# Patient Record
Sex: Male | Born: 1975
Health system: Southern US, Community
[De-identification: ages and names within clinical notes are randomized; demographics above are authoritative.]

## PROBLEM LIST (undated history)

## (undated) ENCOUNTER — Emergency Department (HOSPITAL_COMMUNITY): Payer: PRIVATE HEALTH INSURANCE | Source: Home / Self Care

## (undated) DIAGNOSIS — G8929 Other chronic pain: Secondary | ICD-10-CM

## (undated) DIAGNOSIS — Z87442 Personal history of urinary calculi: Secondary | ICD-10-CM

## (undated) DIAGNOSIS — D869 Sarcoidosis, unspecified: Secondary | ICD-10-CM

## (undated) DIAGNOSIS — Z973 Presence of spectacles and contact lenses: Secondary | ICD-10-CM

## (undated) DIAGNOSIS — Z862 Personal history of diseases of the blood and blood-forming organs and certain disorders involving the immune mechanism: Secondary | ICD-10-CM

## (undated) DIAGNOSIS — I10 Essential (primary) hypertension: Secondary | ICD-10-CM

## (undated) DIAGNOSIS — H9190 Unspecified hearing loss, unspecified ear: Secondary | ICD-10-CM

## (undated) DIAGNOSIS — N200 Calculus of kidney: Secondary | ICD-10-CM

## (undated) DIAGNOSIS — IMO0001 Reserved for inherently not codable concepts without codable children: Secondary | ICD-10-CM

## (undated) HISTORY — DX: Personal history of diseases of the blood and blood-forming organs and certain disorders involving the immune mechanism: Z86.2

## (undated) HISTORY — DX: Reserved for inherently not codable concepts without codable children: IMO0001

## (undated) HISTORY — DX: Other chronic pain: G89.29

## (undated) HISTORY — DX: Essential (primary) hypertension: I10

## (undated) HISTORY — DX: Sarcoidosis, unspecified: D86.9

## (undated) HISTORY — DX: Unspecified hearing loss, unspecified ear: H91.90

## (undated) HISTORY — PX: ESOPHAGEAL DILATION: SHX303

## (undated) HISTORY — DX: Calculus of kidney: N20.0

## (undated) HISTORY — DX: Presence of spectacles and contact lenses: Z97.3

## (undated) SURGERY — Surgical Case
Anesthesia: *Unknown

---

## 2000-09-02 ENCOUNTER — Encounter: Payer: Self-pay | Admitting: Emergency Medicine

## 2000-09-02 ENCOUNTER — Emergency Department (HOSPITAL_COMMUNITY): Admission: EM | Admit: 2000-09-02 | Discharge: 2000-09-02 | Payer: Self-pay | Admitting: Emergency Medicine

## 2000-09-15 ENCOUNTER — Encounter: Admission: RE | Admit: 2000-09-15 | Discharge: 2000-09-15 | Payer: Self-pay | Admitting: Internal Medicine

## 2001-05-03 ENCOUNTER — Emergency Department (HOSPITAL_COMMUNITY): Admission: EM | Admit: 2001-05-03 | Discharge: 2001-05-04 | Payer: Self-pay | Admitting: Emergency Medicine

## 2003-06-25 ENCOUNTER — Emergency Department (HOSPITAL_COMMUNITY): Admission: EM | Admit: 2003-06-25 | Discharge: 2003-06-25 | Payer: Self-pay | Admitting: Emergency Medicine

## 2003-10-04 ENCOUNTER — Emergency Department (HOSPITAL_COMMUNITY): Admission: EM | Admit: 2003-10-04 | Discharge: 2003-10-05 | Payer: Self-pay | Admitting: Emergency Medicine

## 2004-06-09 ENCOUNTER — Ambulatory Visit: Payer: Self-pay | Admitting: Family Medicine

## 2004-06-23 ENCOUNTER — Ambulatory Visit: Payer: Self-pay | Admitting: Family Medicine

## 2004-10-07 ENCOUNTER — Emergency Department (HOSPITAL_COMMUNITY): Admission: EM | Admit: 2004-10-07 | Discharge: 2004-10-07 | Payer: Self-pay | Admitting: Emergency Medicine

## 2004-10-08 ENCOUNTER — Ambulatory Visit: Payer: Self-pay | Admitting: Family Medicine

## 2004-11-15 ENCOUNTER — Ambulatory Visit: Admission: RE | Admit: 2004-11-15 | Discharge: 2004-11-15 | Payer: Self-pay | Admitting: Family Medicine

## 2004-11-18 ENCOUNTER — Ambulatory Visit: Payer: Self-pay | Admitting: Pulmonary Disease

## 2006-06-01 ENCOUNTER — Ambulatory Visit: Payer: Self-pay | Admitting: Family Medicine

## 2006-09-13 ENCOUNTER — Ambulatory Visit: Payer: Self-pay | Admitting: Family Medicine

## 2006-11-14 ENCOUNTER — Emergency Department (HOSPITAL_COMMUNITY): Admission: EM | Admit: 2006-11-14 | Discharge: 2006-11-14 | Payer: Self-pay | Admitting: Emergency Medicine

## 2007-08-04 LAB — PULMONARY FUNCTION TEST

## 2007-09-28 ENCOUNTER — Ambulatory Visit (HOSPITAL_COMMUNITY): Admission: RE | Admit: 2007-09-28 | Discharge: 2007-09-28 | Payer: Self-pay | Admitting: Optometry

## 2007-09-29 ENCOUNTER — Ambulatory Visit (HOSPITAL_COMMUNITY): Admission: RE | Admit: 2007-09-29 | Discharge: 2007-09-29 | Payer: Self-pay | Admitting: Optometry

## 2008-12-08 ENCOUNTER — Emergency Department (HOSPITAL_BASED_OUTPATIENT_CLINIC_OR_DEPARTMENT_OTHER): Admission: EM | Admit: 2008-12-08 | Discharge: 2008-12-08 | Payer: Self-pay | Admitting: Emergency Medicine

## 2008-12-08 ENCOUNTER — Ambulatory Visit: Payer: Self-pay | Admitting: Diagnostic Radiology

## 2010-07-10 LAB — BASIC METABOLIC PANEL
BUN: 17 mg/dL (ref 6–23)
CO2: 26 mEq/L (ref 19–32)
Calcium: 10.2 mg/dL (ref 8.4–10.5)
Chloride: 104 mEq/L (ref 96–112)
Creatinine, Ser: 1 mg/dL (ref 0.4–1.5)
GFR calc Af Amer: >60 mL/min (ref >60)
GFR calc non Af Amer: >60 mL/min (ref >60)
Glucose, Bld: 76 mg/dL (ref 70–99)
Potassium: 4.1 mEq/L (ref 3.5–5.1)
Sodium: 141 mEq/L (ref 135–145)

## 2010-07-10 LAB — CBC
HCT: 43.2 % (ref 39.0–52.0)
Hemoglobin: 14.6 g/dL (ref 13.0–17.0)
MCHC: 33.8 g/dL (ref 30.0–36.0)
MCV: 87.5 fL (ref 78.0–100.0)
Platelets: 362 10*3/uL (ref 150–400)
RBC: 4.94 MIL/uL (ref 4.22–5.81)
RDW: 13.7 % (ref 11.5–15.5)
WBC: 3.9 10*3/uL — ABNORMAL LOW (ref 4.0–10.5)

## 2010-07-10 LAB — POCT CARDIAC MARKERS: Troponin i, poc: <0.05 ng/mL (ref 0.00–0.09)

## 2010-07-10 LAB — DIFFERENTIAL
Basophils Absolute: 0 10*3/uL (ref 0.0–0.1)
Eosinophils Absolute: 0 10*3/uL (ref 0.0–0.7)
Eosinophils Relative: 1 % (ref 0–5)
Lymphocytes Relative: 15 % (ref 12–46)
Monocytes Absolute: 0.4 10*3/uL (ref 0.1–1.0)

## 2010-08-21 NOTE — Procedures (Signed)
NAME:  [name], [name] NO.:  [id number]   MEDICAL RECORD NO.:  [id number]          PATIENT TYPE:  OUT   LOCATION:  SLEEP LAB                     FACILITY:  [facility]   PHYSICIAN:  Marcelyn Bruins, M.D. [employer] DATE OF BIRTH:  February 11, 1976   DATE OF STUDY:  11/15/2004                              NOCTURNAL POLYSOMNOGRAM   REFERRING PHYSICIAN:  Delaney Meigs, M.D.   DATE OF STUDY:  November 15, 2004.   INDICATIONS FOR STUDY:  Persistent disorder of initiating and maintaining  wakefulness.   EPWORTH SLEEPINESS SCORE:  10.   SLEEP ARCHITECTURE:  The patient had a total sleep time of 378 minutes with  decreased slow wave sleep as well as REM. Sleep onset latency was normal as  was REM onset. Sleep efficiency was very good at 94%.   IMPRESSION:  1.  One obstructive hypopnea noted during the entire study with no      clinically significant obstructive apneas. However, the patient did have      mild to moderate snoring as well as nonspecific arousals which could be      consistent with the upper airway resistant syndrome. He also had an      abnormal pressure tracing which could be suggestive of upper airway      resistance as well. Clinical correlation is suggested.  2.  Mild sleep talking noted during the study.  3.  No clinically significant cardiac arrhythmias.  4.  Small numbers of leg jerks without significant sleep disruption.  5.  There is nothing on this patient's sleep study that stands out as a      cause of his significant daytime sleepiness. Further evaluation is      needed for this particular complaint.                                            ______________________________  Marcelyn Bruins, M.D. [facility]  Diplomate, American Board of Sleep  Medicine     KC/MEDQ  D:  11/18/2004 11:48:22  T:  11/18/2004 12:38:03  Job:  [id number]

## 2010-10-16 ENCOUNTER — Encounter: Payer: Self-pay | Admitting: *Deleted

## 2010-10-16 ENCOUNTER — Encounter: Payer: Self-pay | Admitting: Internal Medicine

## 2010-10-19 ENCOUNTER — Ambulatory Visit (INDEPENDENT_AMBULATORY_CARE_PROVIDER_SITE_OTHER): Payer: PRIVATE HEALTH INSURANCE | Admitting: Internal Medicine

## 2010-10-19 ENCOUNTER — Ambulatory Visit (INDEPENDENT_AMBULATORY_CARE_PROVIDER_SITE_OTHER)
Admission: RE | Admit: 2010-10-19 | Discharge: 2010-10-19 | Disposition: A | Discharge status: Home / Self Care | Payer: PRIVATE HEALTH INSURANCE | Source: Ambulatory Visit | Attending: Internal Medicine | Admitting: Internal Medicine

## 2010-10-19 ENCOUNTER — Other Ambulatory Visit (INDEPENDENT_AMBULATORY_CARE_PROVIDER_SITE_OTHER): Payer: PRIVATE HEALTH INSURANCE

## 2010-10-19 ENCOUNTER — Encounter: Payer: Self-pay | Admitting: Internal Medicine

## 2010-10-19 ENCOUNTER — Encounter: Payer: Self-pay | Admitting: *Deleted

## 2010-10-19 VITALS — BP 118/86 | HR 60 | Ht 71.0 in | Wt 173.0 lb

## 2010-10-19 DIAGNOSIS — D869 Sarcoidosis, unspecified: Secondary | ICD-10-CM

## 2010-10-19 LAB — CBC WITH DIFFERENTIAL/PLATELET
Basophils Absolute: 0 10*3/uL (ref 0.0–0.1)
Eosinophils Relative: 1.2 % (ref 0.0–5.0)
HCT: 38.3 % — ABNORMAL LOW (ref 39.0–52.0)
Hemoglobin: 13.2 g/dL (ref 13.0–17.0)
Lymphs Abs: 0.7 10*3/uL (ref 0.7–4.0)
MCV: 87.1 fl (ref 78.0–100.0)
Monocytes Absolute: 0.3 10*3/uL (ref 0.1–1.0)
Monocytes Relative: 7.3 % (ref 3.0–12.0)
Neutro Abs: 3.6 10*3/uL (ref 1.4–7.7)
RDW: 15.6 % — ABNORMAL HIGH (ref 11.5–14.6)

## 2010-10-19 LAB — BASIC METABOLIC PANEL
CO2: 27 mEq/L (ref 19–32)
Calcium: 9.2 mg/dL (ref 8.4–10.5)
Creatinine, Ser: 1.3 mg/dL (ref 0.4–1.5)
GFR: 82.91 mL/min (ref >60.00)
Glucose, Bld: 110 mg/dL — ABNORMAL HIGH (ref 70–99)

## 2010-10-19 LAB — HEPATIC FUNCTION PANEL
Albumin: 3.9 g/dL (ref 3.5–5.2)
Total Protein: 7.7 g/dL (ref 6.0–8.3)

## 2010-10-19 NOTE — Patient Instructions (Signed)
Orders- PPD skin test               CXR               PFT               Lab- CBC w/ diff, BMET, Liver panel, ACE level                    All for dx Sarcoid

## 2010-10-19 NOTE — Progress Notes (Signed)
Subjective:    Patient ID: [name], male    DOB: [birth year], 35 y.o.   MRN: [id number]  HPI 10/19/10- 68 yoM former smoker, seen at request of Lonie Peak, [facility], in [place], because of sarcoid. Here with fiancee. Presented with chest pain, mediastinal adenopathy 2009. Bronchoscopy/ TBB x by Dr Orson Aloe + noncaseating granuomas. CT chest 2009- bilateral hilar adenopathy and diffuse peribronchovascular and parenchymal nodules c/w Stage II Sarcoid. PFT then restricted- FVC 0.56%; FEV1/FVC 0.90; DLCO 75%. Eye exam neg for sarcoid changes.  He was given an inhaler to try, with little effect, and scheduled back to discuss treatment. Admitting now he was scared, he accepted opportunity to go to [place] to work and was never treated.  Now back in this area. For last 1-2 years he has been more aware of exertional dyspnea, relieved by rest. Cough with thick white. Occasional sternal pain with cough. Occasional night sweat.and left flank pain.  Denies nodes, rash, hot joint pain. Was losing some weight until his PCP put him on prednisone. Now off x 3 weeks, it caused fluid retention, but relieved malaise. Tried Flovent and now Symbicort.  No hx TB exposure. 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  Review of Systems Constitutional:   No-   weight loss, night sweats, fevers, chills, fatigue, lassitude. HEENT:   No-   headaches, difficulty swallowing, tooth/dental problems, sore throat,                  No-   sneezing, itching, ear ache, nasal congestion, post nasal drip,   CV:  No-  , orthopnea, PND, swelling in lower extremities, anasarca, dizziness, palpitations  GI:  No-   heartburn, indigestion, abdominal pain, nausea, vomiting, diarrhea,                 change in bowel habits, loss of appetite  Resp:             No-     No non-productive cough,  No-  coughing up of blood.              No-   change in color of mucus.  No- wheezing.    Skin: No-   rash or  lesions.  GU: No-   dysuria, change in color of urine, no urgency or frequency.  No- flank pain.  MS:  No-   joint pain or swelling.  No- decreased range of motion.  No- back pain.  Psych:  No- change in mood or affect. No depression or anxiety.  No memory loss.      Objective:   Physical Exam General- Alert, Oriented, Affect-appropriate, Distress- none acute  Slender [place] Skin- rash-none, lesions- none, excoriation- none Lymphadenopathy- none Head- atraumatic            Eyes- Gross vision intact, PERRLA, conjunctivae clear secretions            Ears- Hearing, canals            Nose- Clear, No-Septal dev, mucus, polyps, erosion, perforation             Throat- Mallampati II , mucosa clear , drainage- none, tonsils- atrophic Neck- flexible , trachea midline, no stridor , thyroid nl, carotid no bruit Chest - symmetrical excursion , unlabored           Heart/CV- RRR , no murmur , no gallop  , no rub, nl s1 s2                           -  JVD- none , edema- none, stasis changes- none, varices- none           Lung- clear to P&A, wheeze- none, cough- none , dullness-none, rub- none  Breath sounds may be coarse, otw clear.           Chest wall-  Abd- tender-no, distended-no, bowel sounds-present, HSM- no Br/ Gen/ Rectal- Not done, not indicated Extrem- cyanosis- none, clubbing, none, atrophy- none, strength- nl Neuro- grossly intact to observation         Assessment & Plan:

## 2010-10-19 NOTE — Assessment & Plan Note (Addendum)
Cough with mucus, occ night sweats, DOE, weight loss- all may be from recurrence of sarcoid. Picture may be affected by prednisone course ended 3 weeks PTA. We will update CXR, PFT, ACE, renal and liver function, then get back to look at options. Can't exclude some OSA or reflux events at night, but not seen now as major risks.

## 2010-10-20 ENCOUNTER — Telehealth: Payer: Self-pay | Admitting: Internal Medicine

## 2010-10-20 LAB — ANGIOTENSIN CONVERTING ENZYME: Angiotensin-Converting Enzyme: 50 U/L (ref 8–52)

## 2010-10-20 NOTE — Progress Notes (Signed)
Quick Note:  Called, spoke with pt. He was informed of CXR results per CDY. He verbalized understanding of this. ______

## 2010-10-20 NOTE — Telephone Encounter (Signed)
Pt advised of labs and CXR per appends. Carron Curie, CMA

## 2010-10-21 ENCOUNTER — Telehealth: Payer: Self-pay | Admitting: Internal Medicine

## 2010-10-21 ENCOUNTER — Encounter: Payer: Self-pay | Admitting: Internal Medicine

## 2010-10-21 NOTE — Telephone Encounter (Signed)
Dr. Maple Hudson,  You wanted pt to have PPD skin test placed at [facility] on Monday but he was requesting to have this done at PCP office bc it is closer to his house.  Dr. Rutherford Nail office is needing an order faxed to them with the dx code for the PPD so pt will not have to pay for this out of pocket.  I have written the script for this -- could you pls sign it so we can fax it.  Thanks!

## 2010-10-21 NOTE — Telephone Encounter (Signed)
Signed by CDY and faxed back to number provided above - [id number] to inform pt of this.  Rx placed in CDY's scan folder.

## 2010-10-21 NOTE — Telephone Encounter (Signed)
Please call [phone] (laytoya stubbs) is returning triage call. [name]

## 2010-10-21 NOTE — Telephone Encounter (Signed)
Spoke with [place] and notified that order was faxed and she verbalized understanding.

## 2010-11-16 ENCOUNTER — Ambulatory Visit (INDEPENDENT_AMBULATORY_CARE_PROVIDER_SITE_OTHER): Payer: PRIVATE HEALTH INSURANCE | Admitting: Internal Medicine

## 2010-11-16 ENCOUNTER — Encounter: Payer: Self-pay | Admitting: Internal Medicine

## 2010-11-16 VITALS — BP 110/70 | HR 94 | Ht 71.0 in | Wt 166.4 lb

## 2010-11-16 DIAGNOSIS — D869 Sarcoidosis, unspecified: Secondary | ICD-10-CM

## 2010-11-16 MED ORDER — PREDNISONE 5 MG PO TABS: ORAL_TABLET | ORAL | Status: DC

## 2010-11-16 NOTE — Patient Instructions (Signed)
Stop Symbicort maintenance inhaler. We will watch to see if cough or chest tightness come when you are not using it.   Try prednisone 5 mg daily x 2 weeks then every other day for 2 weeks. See if it improves your appetite.

## 2010-11-16 NOTE — Assessment & Plan Note (Addendum)
Nothing we have at this time suggests active sarcoid except his weight loss and that may be due to something else. Early satiety without palpable HS megaly, may not be related to sarcoid. Lonie Peak may want to check a fasting glucose.  We discussed goals and side effects of sarcoid therapy. We decided to try low dose prednisone.

## 2010-11-16 NOTE — Progress Notes (Addendum)
Subjective:    Patient ID: [name], male    DOB: 1975/12/03, 35 y.o.   MRN: [id number]  HPI    Review of Systems     Objective:   Physical Exam        Assessment & Plan:   Subjective:    Patient ID: [name], male    DOB: [birth year], 35 y.o.   MRN: [id number]  HPI 10/19/10- 64 yoM former smoker, seen at request of Lonie Peak, [facility], in [place], because of sarcoid. Here with fiancee. Presented with chest pain, mediastinal adenopathy 2009. Bronchoscopy/ TBB x by Dr Orson Aloe + noncaseating granuomas. CT chest 2009- bilateral hilar adenopathy and diffuse peribronchovascular and parenchymal nodules c/w Stage II Sarcoid. PFT then restricted- FVC 0.56%; FEV1/FVC 0.90; DLCO 75%. Eye exam neg for sarcoid changes.  He was given an inhaler to try, with little effect, and scheduled back to discuss treatment. Admitting now he was scared, he accepted opportunity to go to [place] to work and was never treated.  Now back in this area. For last 1-2 years he has been more aware of exertional dyspnea, relieved by rest. Cough with thick white. Occasional sternal pain with cough. Occasional night sweat.and left flank pain.  Denies nodes, rash, hot joint pain. Was losing some weight until his PCP put him on prednisone. Now off x 3 weeks, it caused fluid retention, but relieved malaise. Tried Flovent and now Symbicort.  No hx TB exposure. 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  11/16/10- 34 yoM former smoker, seen at request of Lonie Peak, [facility], in [place], because of sarcoid. Fiance here Has remained off prednisone during lab evaluation. Without it appetite is poor and he is losing weight. Notes cough if he laughs or if he exerts and gets short of breath. May cough some yellow, no blood. Feels hot at times. No heavy night sweats. Denies rash or nodes. Early satiety. Denies nocturia, palpitation, nervousness.  Labs 10/29/10- PPD- negative by  PCP-documented ACE-50 (8-52) CBC okay, BMET okay (random glucose 110), LFT okay  CXR- 10/19/2010 stable bilateral upper zone scarring without change compared with September 2010   Review of Systems Constitutional:    + weight loss       No- night sweats, fevers, chills, fatigue, lassitude. HEENT:   No-  headaches, difficulty swallowing, tooth/dental problems, sore throat,       No-  sneezing, itching, ear ache, nasal congestion, post nasal drip,  CV:  No-   chest pain, orthopnea, PND, swelling in lower extremities, anasarca,dizziness, palpitations Resp: Minor-  shortness of breath with exertion or at rest.               + productive cough,  No non-productive cough,  No-  coughing up of blood.              No-   change in color of mucus.  No- wheezing.   Skin: No-   rash or lesions. GI:  No-   heartburn, indigestion, abdominal pain, nausea, vomiting, diarrhea,                 change in bowel habits, loss of appetite GU: No-   dysuria, change in color of urine, no urgency or frequency.  No- flank pain. MS:  No-   joint pain or swelling.  No- decreased range of motion.  No- back pain. Neuro- no change Psych:  No- change in mood or affect. No depression or anxiety.  No memory loss.   Objective:   Physical Exam General- Alert, Oriented, Affect-appropriate, Distress- none acute  Very slender AA male Skin- rash-none, lesions- none, excoriation- none Lymphadenopathy- none Head- atraumatic            Eyes- Gross vision intact, PERRLA, conjunctivae clear secretions            Ears- Hearing, canals normal            Nose- Clear, No-Septal dev, mucus, polyps, erosion, perforation             Throat- Mallampati II , mucosa clear , drainage- none, tonsils- atrophic Neck- flexible , trachea midline, no stridor , thyroid nl, carotid no bruit Chest - symmetrical excursion , unlabored           Heart/CV- RRR , no murmur , no gallop  , no rub, nl s1 s2                           - JVD- none , edema-  none, stasis changes- none, varices- none           Lung- clear to P&A, wheeze- none, cough- none , dullness-none, rub- none  Breath sounds may be coarse, otw clear.           Chest wall-  Abd- tender-no, distended-no, bowel sounds-present, HSM- no Br/ Gen/ Rectal- Not done, not indicated Extrem- cyanosis- none, clubbing, none, atrophy- none, strength- nl Neuro- grossly intact to observation         Assessment & Plan:

## 2010-12-25 ENCOUNTER — Encounter: Payer: Self-pay | Admitting: Internal Medicine

## 2010-12-25 ENCOUNTER — Ambulatory Visit (INDEPENDENT_AMBULATORY_CARE_PROVIDER_SITE_OTHER): Payer: PRIVATE HEALTH INSURANCE | Admitting: Internal Medicine

## 2010-12-25 VITALS — BP 122/90 | HR 81 | Ht 71.0 in | Wt 168.2 lb

## 2010-12-25 DIAGNOSIS — D869 Sarcoidosis, unspecified: Secondary | ICD-10-CM

## 2010-12-25 DIAGNOSIS — Z23 Encounter for immunization: Secondary | ICD-10-CM

## 2010-12-25 MED ORDER — PREDNISONE 5 MG PO TABS: ORAL_TABLET | ORAL | Status: AC

## 2010-12-25 NOTE — Patient Instructions (Addendum)
Change your prednisone to take 2 x 5 mg =  10 mg every other day. Script printed.  Please call as needed  Flu vax

## 2010-12-25 NOTE — Assessment & Plan Note (Signed)
Symptomatically improved, but with some ambiguous aches and pains. Lengthy educational discussion- 20 minutes- of steroid therapy, risks and side effects, nature of prednisone. He probably has indolent sarcoid, not quite full remission. Plan- try increase to 10 mg pred qod.

## 2010-12-25 NOTE — Progress Notes (Signed)
Subjective:   Shortness of Breath   Review of Systems  Respiratory: Positive for shortness of breath.       Objective:   Physical Exam    sessment & Plan:   Subjective:    Patient ID: [name], male    DOB: Sep 22, 1975, 35 y.o.   MRN: [id number]  HPI 10/19/10- 65 yoM former smoker, seen at request of Lonie Peak, [facility], in [place], because of sarcoid. Here with fiancee. Presented with chest pain, mediastinal adenopathy 2009. Bronchoscopy/ TBB x by Dr Orson Aloe + noncaseating granuomas. CT chest 2009- bilateral hilar adenopathy and diffuse peribronchovascular and parenchymal nodules c/w Stage II Sarcoid. PFT then restricted- FVC 0.56%; FEV1/FVC 0.90; DLCO 75%. Eye exam neg for sarcoid changes.  He was given an inhaler to try, with little effect, and scheduled back to discuss treatment. Admitting now he was scared, he accepted opportunity to go to [place] to work and was never treated.  Now back in this area. For last 1-2 years he has been more aware of exertional dyspnea, relieved by rest. Cough with thick white. Occasional sternal pain with cough. Occasional night sweat.and left flank pain.  Denies nodes, rash, hot joint pain. Was losing some weight until his PCP put him on prednisone. Now off x 3 weeks, it caused fluid retention, but relieved malaise. Tried Flovent and now Symbicort.  No hx TB exposure. 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  11/16/10- 34 yoM former smoker, seen at request of Lonie Peak, [facility], in [place], because of sarcoid. Fiance here Has remained off prednisone during lab evaluation. Without it appetite is poor and he is losing weight. Notes cough if he laughs or if he exerts and gets short of breath. May cough some yellow, no blood. Feels hot at times. No heavy night sweats. Denies rash or nodes. Early satiety. Denies nocturia, palpitation, nervousness.  Labs 10/29/10- PPD- negative by PCP-documented ACE-50 (8-52) CBC okay,  BMET okay (random glucose 110), LFT okay  CXR- 10/19/2010 stable bilateral upper zone scarring without change compared with September 2010  12/25/10- 34 yoM former smoker, seen at request of Lonie Peak, [facility], in [place], because of sarcoid. Fiancee here. At last visit we had him take prednisone 5 mg daily , then every other day, and stop Symbicort.  Since then he has felt some better, with no new problems. Weight is up 4-5 lbs and appetite is better. Less short of breath, still some dry cough but no more tussive chest soreness. No longer having heavy night sweats. Notes some soreness in right flank which may be muscular, w/o dysuria. Some joint ache- mainly left elbow, and a little muscle twitch. No longer having leg edema seen at initial higher dose steroid introduction.  Review of Systems Constitutional:    No- weight loss       No- night sweats, fevers, chills, fatigue, lassitude. HEENT:   No-  headaches, difficulty swallowing, tooth/dental problems, sore throat,       No-  sneezing, itching, ear ache, nasal congestion, post nasal drip,  CV:  No-   chest pain, orthopnea, PND, swelling in lower extremities, anasarca,dizziness, palpitations Resp: Minor-  shortness of breath with exertion or at rest.               No- productive cough,  + non-productive cough,  No-  coughing up of blood.              No-   change in color  of mucus.  No- wheezing.   Skin: No-   rash or lesions. GI:  No-   heartburn, indigestion, abdominal pain, nausea, vomiting, diarrhea,                 change in bowel habits, loss of appetite GU: No-   dysuria, change in color of urine, no urgency or frequency.  No- flank pain. MS:  No-   joint pain or swelling.  No- decreased range of motion.  + back pain-right flank muscle Neuro- no change Psych:  No- change in mood or affect. No depression or anxiety.  No memory loss.   Objective:   Physical Exam General- Alert, Oriented, Affect-appropriate, Distress- none acute  Very  slender AA male Skin- rash-none, lesions- none, excoriation- none Lymphadenopathy- none Head- atraumatic            Eyes- Gross vision intact, PERRLA, conjunctivae clear secretions            Ears- Hearing, canals normal            Nose- Clear, No-Septal dev, mucus, polyps, erosion, perforation             Throat- Mallampati II , mucosa clear , drainage- none, tonsils- atrophic Neck- flexible , trachea midline, no stridor , thyroid nl, carotid no bruit Chest - symmetrical excursion , unlabored           Heart/CV- RRR , no murmur , no gallop  , no rub, nl s1 s2                           - JVD- none , edema- none, stasis changes- none, varices- none           Lung- clear to P&A, wheeze- none, cough- none , dullness-none, rub- none  Breath sounds may be coarse, otw clear.           Chest wall-  Abd- tender-no, distended-no, bowel sounds-present, HSM- no Br/ Gen/ Rectal- Not done, not indicated Extrem- cyanosis- none, clubbing, none, atrophy- none, strength- nl Neuro- grossly intact to observation         Assessment & Plan:

## 2011-04-26 ENCOUNTER — Other Ambulatory Visit: Payer: PRIVATE HEALTH INSURANCE

## 2011-04-26 ENCOUNTER — Ambulatory Visit (INDEPENDENT_AMBULATORY_CARE_PROVIDER_SITE_OTHER)
Admission: RE | Admit: 2011-04-26 | Discharge: 2011-04-26 | Disposition: A | Discharge status: Home / Self Care | Payer: BC Managed Care – PPO | Source: Ambulatory Visit | Attending: Internal Medicine | Admitting: Internal Medicine

## 2011-04-26 ENCOUNTER — Encounter: Payer: Self-pay | Admitting: Internal Medicine

## 2011-04-26 ENCOUNTER — Ambulatory Visit (INDEPENDENT_AMBULATORY_CARE_PROVIDER_SITE_OTHER): Payer: BC Managed Care – PPO | Admitting: Internal Medicine

## 2011-04-26 VITALS — BP 118/64 | HR 91 | Ht 71.0 in | Wt 175.2 lb

## 2011-04-26 DIAGNOSIS — D869 Sarcoidosis, unspecified: Secondary | ICD-10-CM

## 2011-04-26 NOTE — Patient Instructions (Signed)
Order - CXR- dx sarcoid              Lab- ACE level  Try a skin moisturizer- Eucerin is fine

## 2011-04-26 NOTE — Progress Notes (Signed)
Patient ID: [name], male    DOB: 08-Apr-1975, 36 y.o.   MRN: [id number]  HPI 10/19/10- 47 yoM former smoker, seen at request of Lonie Peak, [facility], in [place], because of sarcoid. Here with fiancee. Presented with chest pain, mediastinal adenopathy 2009. Bronchoscopy/ TBB x by Dr Orson Aloe + noncaseating granuomas. CT chest 2009- bilateral hilar adenopathy and diffuse peribronchovascular and parenchymal nodules c/w Stage II Sarcoid. PFT then restricted- FVC 0.56%; FEV1/FVC 0.90; DLCO 75%. Eye exam neg for sarcoid changes.  He was given an inhaler to try, with little effect, and scheduled back to discuss treatment. Admitting now he was scared, he accepted opportunity to go to [place] to work and was never treated.  Now back in this area. For last 1-2 years he has been more aware of exertional dyspnea, relieved by rest. Cough with thick white. Occasional sternal pain with cough. Occasional night sweat.and left flank pain.  Denies nodes, rash, hot joint pain. Was losing some weight until his PCP put him on prednisone. Now off x 3 weeks, it caused fluid retention, but relieved malaise. Tried Flovent and now Symbicort.  No hx TB exposure. 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  11/16/10- 34 yoM former smoker, seen at request of Lonie Peak, [facility], in [place], because of sarcoid. Fiance here Has remained off prednisone during lab evaluation. Without it appetite is poor and he is losing weight. Notes cough if he laughs or if he exerts and gets short of breath. May cough some yellow, no blood. Feels hot at times. No heavy night sweats. Denies rash or nodes. Early satiety. Denies nocturia, palpitation, nervousness.  Labs 10/29/10- PPD- negative by PCP-documented ACE-50 (8-52) CBC okay, BMET okay (random glucose 110), LFT okay  CXR- 10/19/2010 stable bilateral upper zone scarring without change compared with September 2010  12/25/10- 34 yoM former smoker, seen at  request of Lonie Peak, [facility], in [place], because of sarcoid. Fiancee here. At last visit we had him take prednisone 5 mg daily , then every other day, and stop Symbicort.  Since then he has felt some better, with no new problems. Weight is up 4-5 lbs and appetite is better. Less short of breath, still some dry cough but no more tussive chest soreness. No longer having heavy night sweats. Notes some soreness in right flank which may be muscular, w/o dysuria. Some joint ache- mainly left elbow, and a little muscle twitch. No longer having leg edema seen at initial higher dose steroid introduction.  04/26/11- 34 yoM former smoker because of sarcoid. Fiancee here. No respiratory infection/ colds or flu this winter. Was on prednisone 10 mg qod after last visit, but dropped off at Christmas when he left pills while visiting in [place]. Has been off prednisone x 1 month. He and fiance note increased night sweats and nonproductive cough since off prednisone. Generalized itching w/o rash- skin is very dry. Denies fever, nodes, abdominal pain, dyspnea.  ACE level 10/19/10- 50 CXR 10/19/10- diffuse reticulonodular scarring, chronic.  Review of Systems Constitutional:    No- weight loss       No- night sweats, fevers, chills, fatigue, lassitude. HEENT:   No-  headaches, difficulty swallowing, tooth/dental problems, sore throat,       No-  sneezing, itching, ear ache, nasal congestion, post nasal drip,  CV:  No-   chest pain, orthopnea, PND, swelling in lower extremities, anasarca,dizziness, palpitations Resp: Minor-  shortness of breath with exertion or at rest.  No- productive cough,  + non-productive cough,  No-  coughing up of blood.              No-   change in color of mucus.  No- wheezing.   Skin: No-  rash or lesions. + dry, itching GI:  No-   heartburn, indigestion, abdominal pain, nausea, vomiting, diarrhea,                 change in bowel habits, loss of appetite GU:  MS:  No-   joint  pain or swelling.  No- decreased range of motion.  + back pain-right flank muscle Neuro- no change Psych:  No- change in mood or affect. No depression or anxiety.  No memory loss.   Objective:   Physical Exam General- Alert, Oriented, Affect-appropriate, Distress- none acute  Very slender AA male Skin- rash-none, lesions- none, excoriation- none Lymphadenopathy- none Head- atraumatic            Eyes- Gross vision intact, PERRLA, conjunctivae clear secretions            Ears- Hearing, canals normal            Nose- Clear, No-Septal dev, mucus, polyps, erosion, perforation             Throat- Mallampati II , mucosa clear , drainage- none, tonsils- atrophic Neck- flexible , trachea midline, no stridor , thyroid nl, carotid no bruit Chest - symmetrical excursion , unlabored           Heart/CV- RRR , no murmur , no gallop  , no rub, nl s1 s2                           - JVD- none , edema- none, stasis changes- none, varices- none           Lung- trace crackle, wheeze- none, cough-+ once, dullness-none, rub- none             Chest wall-  Abd- tender-no, distended-no, bowel sounds-present, HSM- no Br/ Gen/ Rectal- Not done, not indicated Extrem- cyanosis- none, clubbing, none, atrophy- none, strength- nl Neuro- grossly intact to observation

## 2011-04-26 NOTE — Assessment & Plan Note (Signed)
Increased cough and night sweats since off prednisone x 1 month, c/w w/ increased sarcoid activity. The itching may just be from dry skin. Plan- CXR, ACE, Eucerin   We will decide about restarting prednisone after labs reviewed- we will call.

## 2011-04-29 ENCOUNTER — Other Ambulatory Visit: Payer: Self-pay | Admitting: Internal Medicine

## 2011-04-29 MED ORDER — PREDNISONE 10 MG PO TABS: 10.0000 mg | ORAL_TABLET | ORAL | Status: DC

## 2011-04-29 NOTE — Progress Notes (Signed)
Quick Note:  LMTCB ______ 

## 2011-04-29 NOTE — Progress Notes (Signed)
Quick Note:  Pt aware of results and Rx sent to [facility] as pts request. ______

## 2011-04-29 NOTE — Progress Notes (Signed)
Quick Note:  Pt aware of results. ______ 

## 2011-07-26 ENCOUNTER — Ambulatory Visit: Payer: PRIVATE HEALTH INSURANCE | Admitting: Internal Medicine

## 2011-10-25 ENCOUNTER — Encounter (HOSPITAL_COMMUNITY): Payer: Self-pay

## 2011-10-25 ENCOUNTER — Emergency Department (HOSPITAL_COMMUNITY)
Admission: EM | Admit: 2011-10-25 | Discharge: 2011-10-25 | Disposition: A | Discharge status: Home / Self Care | Payer: Self-pay | Source: Home / Self Care | Attending: Emergency Medicine | Admitting: Emergency Medicine

## 2011-10-25 DIAGNOSIS — K0510 Chronic gingivitis, plaque induced: Secondary | ICD-10-CM

## 2011-10-25 DIAGNOSIS — K056 Periodontal disease, unspecified: Secondary | ICD-10-CM

## 2011-10-25 MED ORDER — PENICILLIN V POTASSIUM 500 MG PO TABS: 500.0000 mg | ORAL_TABLET | Freq: Four times a day (QID) | ORAL | Status: AC

## 2011-10-25 MED ORDER — IBUPROFEN 800 MG PO TABS: 800.0000 mg | ORAL_TABLET | Freq: Three times a day (TID) | ORAL | Status: AC

## 2011-10-25 MED ORDER — HYDROCODONE-ACETAMINOPHEN 5-500 MG PO TABS: 1.0000 | ORAL_TABLET | Freq: Four times a day (QID) | ORAL | Status: AC | PRN

## 2011-10-25 NOTE — ED Notes (Signed)
C/o dental pain- (rt upper and lower) since Thursday. Doesn't have dentist.

## 2011-10-25 NOTE — ED Provider Notes (Addendum)
History     CSN: [id number]  Arrival date & time 10/25/11  1733   First MD Initiated Contact with Patient 10/25/11 1742      Chief Complaint  Patient presents with  . Dental Pain    (Consider location/radiation/quality/duration/timing/severity/associated sxs/prior treatment) Patient is a 36 y.o. male presenting with tooth pain. The history is provided by the patient.  Dental PainThe primary symptoms include mouth pain and sore throat. Primary symptoms do not include oral bleeding, oral lesions, headaches, fever, shortness of breath or angioedema. The symptoms began 3 to 5 days ago. The symptoms are unchanged. The symptoms occur constantly.  The sore throat is not accompanied by trouble swallowing.  Additional symptoms do not include: trouble swallowing, nosebleeds and fatigue.    Past Medical History  Diagnosis Date  . Sarcoidosis   . Hypertension   . Asthma     Past Surgical History  Procedure Date  . None     Family History  Problem Relation Age of Onset  . Hypertension Mother   . Asthma Mother   . Diabetes Father   . Heart attack Father   . Cancer Paternal Grandmother     History  Substance Use Topics  . Smoking status: Former Smoker -- 0.2 packs/day for 2 years    Types: Cigarettes    Quit date: 04/06/2007  . Smokeless tobacco: Never Used  . Alcohol Use: No      Review of Systems  Constitutional: Negative for fever, activity change and fatigue.  HENT: Positive for sore throat. Negative for nosebleeds, congestion, trouble swallowing, neck pain and voice change.   Respiratory: Negative for shortness of breath.   Skin: Negative for rash.  Neurological: Negative for headaches.    Allergies  Review of patient's allergies indicates no known allergies.  Home Medications   Current Outpatient Rx  Name Route Sig Dispense Refill  . ALBUTEROL SULFATE HFA 108 (90 BASE) MCG/ACT IN AERS Inhalation Inhale 2 puffs into the lungs every 6 (six) hours as needed.       Marland Kitchen FLUTICASONE PROPIONATE  HFA 110 MCG/ACT IN AERO Inhalation Inhale 2 puffs into the lungs 2 (two) times daily. ON HOLD    . HYDROCODONE-ACETAMINOPHEN 5-500 MG PO TABS Oral Take 1 tablet by mouth every 6 (six) hours as needed for pain. 15 tablet 0  . IBUPROFEN 800 MG PO TABS Oral Take 1 tablet (800 mg total) by mouth 3 (three) times daily. 15 tablet 0  . LOSARTAN POTASSIUM 50 MG PO TABS Oral Take 50 mg by mouth daily.      Marland Kitchen PENICILLIN V POTASSIUM 500 MG PO TABS Oral Take 1 tablet (500 mg total) by mouth 4 (four) times daily. 20 tablet 0  . PREDNISONE 10 MG PO TABS Oral Take 1 tablet (10 mg total) by mouth every other day. 50 tablet 0    BP 141/98  Pulse 67  Temp 98.1 F (36.7 C) (Oral)  Resp 18  SpO2 100%  Physical Exam  Nursing note and vitals reviewed. Constitutional: He appears well-developed and well-nourished.  HENT:  Mouth/Throat: Oropharynx is clear and moist and mucous membranes are normal. Abnormal dentition. Dental caries present.    Neck: Normal range of motion.  Neurological: He is alert.  Skin: No rash noted. No erythema.    ED Course  Procedures (including critical care time)  Labs Reviewed - No data to display No results found.   1. Periodontal disease   2. Gingivitis  MDM   Poor dentition, periodontal disease and 2 molars without is occlusal surface. Patient is afebrile, with no other constitutional symptoms. Will see his dentist, Monday next week.       Jimmie Molly, MD 10/25/11 1946  Jimmie Molly, MD 10/25/11 [phone]

## 2012-01-04 ENCOUNTER — Emergency Department (HOSPITAL_COMMUNITY): Payer: Self-pay

## 2012-01-04 ENCOUNTER — Emergency Department (HOSPITAL_COMMUNITY)
Admission: EM | Admit: 2012-01-04 | Discharge: 2012-01-04 | Disposition: A | Discharge status: Home / Self Care | Payer: Self-pay | Attending: Emergency Medicine | Admitting: Emergency Medicine

## 2012-01-04 DIAGNOSIS — J45909 Unspecified asthma, uncomplicated: Secondary | ICD-10-CM | POA: Insufficient documentation

## 2012-01-04 DIAGNOSIS — D869 Sarcoidosis, unspecified: Secondary | ICD-10-CM | POA: Insufficient documentation

## 2012-01-04 DIAGNOSIS — N200 Calculus of kidney: Secondary | ICD-10-CM | POA: Insufficient documentation

## 2012-01-04 DIAGNOSIS — Z87891 Personal history of nicotine dependence: Secondary | ICD-10-CM | POA: Insufficient documentation

## 2012-01-04 DIAGNOSIS — N201 Calculus of ureter: Secondary | ICD-10-CM | POA: Insufficient documentation

## 2012-01-04 DIAGNOSIS — I10 Essential (primary) hypertension: Secondary | ICD-10-CM | POA: Insufficient documentation

## 2012-01-04 DIAGNOSIS — R110 Nausea: Secondary | ICD-10-CM | POA: Insufficient documentation

## 2012-01-04 DIAGNOSIS — N135 Crossing vessel and stricture of ureter without hydronephrosis: Secondary | ICD-10-CM

## 2012-01-04 DIAGNOSIS — R109 Unspecified abdominal pain: Secondary | ICD-10-CM | POA: Insufficient documentation

## 2012-01-04 LAB — CBC WITH DIFFERENTIAL/PLATELET
Basophils Absolute: 0 10*3/uL (ref 0.0–0.1)
HCT: 39.6 % (ref 39.0–52.0)
Hemoglobin: 14 g/dL (ref 13.0–17.0)
Lymphocytes Relative: 12 % (ref 12–46)
Monocytes Absolute: 0.4 10*3/uL (ref 0.1–1.0)
Neutro Abs: 5.5 10*3/uL (ref 1.7–7.7)
RBC: 4.84 MIL/uL (ref 4.22–5.81)
RDW: 13.6 % (ref 11.5–15.5)
WBC: 6.8 10*3/uL (ref 4.0–10.5)

## 2012-01-04 LAB — COMPREHENSIVE METABOLIC PANEL
ALT: 13 U/L (ref 0–53)
AST: 24 U/L (ref 0–37)
CO2: 25 mEq/L (ref 19–32)
Chloride: 98 mEq/L (ref 96–112)
Creatinine, Ser: 1.77 mg/dL — ABNORMAL HIGH (ref 0.50–1.35)
GFR calc non Af Amer: 47 mL/min — ABNORMAL LOW (ref >90)
Total Bilirubin: 0.6 mg/dL (ref 0.3–1.2)

## 2012-01-04 LAB — URINALYSIS, ROUTINE W REFLEX MICROSCOPIC
Glucose, UA: NEGATIVE mg/dL
Protein, ur: NEGATIVE mg/dL
Specific Gravity, Urine: 1.02 (ref 1.005–1.030)
pH: 6 (ref 5.0–8.0)

## 2012-01-04 LAB — URINE MICROSCOPIC-ADD ON

## 2012-01-04 MED ORDER — MORPHINE SULFATE 4 MG/ML IJ SOLN
6.0000 mg | Freq: Once | INTRAMUSCULAR | Status: AC
  Administered 2012-01-04: 6 mg via INTRAVENOUS
  Filled 2012-01-04: qty 2

## 2012-01-04 MED ORDER — OXYCODONE-ACETAMINOPHEN 5-325 MG PO TABS: 1.0000 | ORAL_TABLET | ORAL | Status: DC | PRN

## 2012-01-04 MED ORDER — ONDANSETRON HCL 4 MG/2ML IJ SOLN
4.0000 mg | Freq: Once | INTRAMUSCULAR | Status: AC
  Administered 2012-01-04: 4 mg via INTRAVENOUS
  Filled 2012-01-04: qty 2

## 2012-01-04 MED ORDER — HYDROMORPHONE HCL PF 1 MG/ML IJ SOLN
1.0000 mg | Freq: Once | INTRAMUSCULAR | Status: AC
  Administered 2012-01-04: 1 mg via INTRAVENOUS
  Filled 2012-01-04: qty 1

## 2012-01-04 NOTE — ED Notes (Signed)
Right sided flank pain started bad today, does not have regular doctor. States when pain hits, makes nauseated. Pain comes and goes-- intermittent. Pain was relieved last night with ibuprofen, went to work today, pain radiated to abd area-- with nausea

## 2012-01-04 NOTE — ED Provider Notes (Signed)
History     CSN: [id number]  Arrival date & time 01/04/12  1754   First MD Initiated Contact with Patient 01/04/12 2005      Chief Complaint  Patient presents with  . Flank Pain    (Consider location/radiation/quality/duration/timing/severity/associated sxs/prior treatment) HPI Patient presents to the emergency department with a two-day history of right flank pain.  Patient, states the pain started in the right back and seemed to then radiate around to his right abdomen.  Patient, states, that he's had nausea, but no vomiting, or diarrhea.  Patient denies fevers, chest pain, shortness of breath, fever, headache, or weakness.  Patient denies any blood in his urine.  Patient, states he took ibuprofen without relief of the symptoms.  Past Medical History  Diagnosis Date  . Sarcoidosis   . Hypertension   . Asthma     Past Surgical History  Procedure Date  . None     Family History  Problem Relation Age of Onset  . Hypertension Mother   . Asthma Mother   . Diabetes Father   . Heart attack Father   . Cancer Paternal Grandmother     History  Substance Use Topics  . Smoking status: Former Smoker -- 0.2 packs/day for 2 years    Types: Cigarettes    Quit date: 04/06/2007  . Smokeless tobacco: Never Used  . Alcohol Use: No      Review of Systems All other systems negative except as documented in the HPI. All pertinent positives and negatives as reviewed in the HPI.  Allergies  Review of patient's allergies indicates no known allergies.  Home Medications   Current Outpatient Rx  Name Route Sig Dispense Refill  . ALBUTEROL SULFATE HFA 108 (90 BASE) MCG/ACT IN AERS Inhalation Inhale 2 puffs into the lungs every 6 (six) hours as needed. For wheezing or shortness of breath    . IBUPROFEN 200 MG PO TABS Oral Take 800 mg by mouth every 6 (six) hours as needed. For pain      BP 133/87  Pulse 84  Temp 98.2 F (36.8 C)  Resp 16  SpO2 99%  Physical Exam    Constitutional: He is oriented to person, place, and time. He appears well-developed and well-nourished. No distress.  HENT:  Head: Normocephalic and atraumatic.  Mouth/Throat: Oropharynx is clear and moist.  Neck: Normal range of motion. Neck supple.  Cardiovascular: Normal rate, regular rhythm and normal heart sounds.  Exam reveals no gallop and no friction rub.   No murmur heard. Pulmonary/Chest: Effort normal and breath sounds normal. No respiratory distress.  Abdominal: Soft. Bowel sounds are normal. He exhibits no distension. There is no tenderness. There is no rebound and no guarding.  Neurological: He is alert and oriented to person, place, and time.  Skin: Skin is warm and dry. No rash noted. He is not diaphoretic.    ED Course  Procedures (including critical care time)  Labs Reviewed  URINALYSIS, ROUTINE W REFLEX MICROSCOPIC - Abnormal; Notable for the following:    Hgb urine dipstick SMALL (*)     Leukocytes, UA MODERATE (*)     All other components within normal limits  CBC WITH DIFFERENTIAL - Abnormal; Notable for the following:    Neutrophils Relative 81 (*)     All other components within normal limits  COMPREHENSIVE METABOLIC PANEL - Abnormal; Notable for the following:    Creatinine, Ser 1.77 (*)     Total Protein 8.8 (*)  GFR calc non Af Amer 47 (*)     GFR calc Af Amer 55 (*)     All other components within normal limits  URINE MICROSCOPIC-ADD ON - Abnormal; Notable for the following:    Crystals CA OXALATE CRYSTALS (*)     All other components within normal limits   Ct Abdomen Pelvis Wo Contrast  01/04/2012  *RADIOLOGY REPORT*  Clinical Data: Right flank pain.  Nausea.  CT ABDOMEN AND PELVIS WITHOUT CONTRAST  Technique:  Multidetector CT imaging of the abdomen and pelvis was performed following the standard protocol without intravenous contrast.  Comparison: None.  Findings: Numerous tiny nodules project at the lung bases.  The appearance is probably  secondary to the patient's history of sarcoidosis.  Mild interstitial accentuation noted at the lung bases.  Splenomegaly noted, estimated volume about 660 cc.  Right hydronephrosis noted secondary to a cluster of two adjacent UPJ calculi, each measuring up to about 9 mm in length.  There is a separate nonobstructive cluster of several calculi in the right kidney lower pole, with one calculus measuring 6 mm in one measuring 3 mm.  Right perirenal stranding noted. No distal ureteral calculus or bladder calculus observed.  No left-sided calculi identified.  Adrenal glands unremarkable.  Nodularity in the gastrohepatic ligament may be from adenopathy or possibly vascular structures. Retroperitoneal lymph nodes are in the upper normal size range.  The appendix is poorly seen.  Small bilateral external iliac and inguinal lymph nodes noted.  Urinary bladder unremarkable.  No pelvic ascites noted.  Minimal levoconvex lumbar scoliosis noted.  IMPRESSION:  1.  Obstructive cluster of two large calculi at the right UPJ. 2.  Nonobstructive cluster of calculi in the right kidney lower pole. 3.  Innumerable faint nodules at the lung bases, likely a manifestation of the patient's sarcoidosis. 4.  Splenomegaly. 5.  Nodularity in the gastrohepatic ligament could be from varices or mild adenopathy.   Original Report Authenticated By: Dellia Cloud, M.D.    I spoke with Dr. Patsi Sears from urology about the patient in detail.  He states, that the he would like to see the patient in his office tomorrow.  Patient is afebrile and stable here in the emergency department.  I have given.  The patient full rundown of his laboratory testing, and CT scan.  Patient voices an understanding and understands the need for followup the patient is told to return here for any changes in his condition such as increased pain, fever, vomiting, or any other symptom he feels, needs to be rechecked.  All questions were answered.  He gives a verbal  understanding of the plan.  On reassessment, the patient's pain is significantly improved however, he says he feels the pain may be coming back.  MDM  MDM Reviewed: nursing note and vitals Interpretation: labs and CT scan Consults: urology            Carlyle Dolly, PA-C 01/04/12 2148

## 2012-01-04 NOTE — Discharge Instructions (Signed)
Return here as needed for any worsening in your condition. Call Dr. Patsi Sears for an appointment for tomorrow.

## 2012-01-07 NOTE — ED Provider Notes (Signed)
Medical screening examination/treatment/procedure(s) were performed by non-physician practitioner and as supervising physician I was immediately available for consultation/collaboration.   Suzi Roots, MD 01/07/12 [phone]

## 2012-01-12 ENCOUNTER — Encounter (HOSPITAL_COMMUNITY): Payer: Self-pay | Admitting: Anesthesiology

## 2012-01-12 ENCOUNTER — Ambulatory Visit (HOSPITAL_COMMUNITY): Payer: Self-pay | Admitting: Anesthesiology

## 2012-01-12 ENCOUNTER — Encounter (HOSPITAL_COMMUNITY)
Admission: RE | Disposition: A | Discharge status: Home / Self Care | Payer: Self-pay | Source: Ambulatory Visit | Attending: Urology

## 2012-01-12 ENCOUNTER — Encounter (HOSPITAL_COMMUNITY): Payer: Self-pay | Admitting: *Deleted

## 2012-01-12 ENCOUNTER — Inpatient Hospital Stay (HOSPITAL_COMMUNITY)
Admission: RE | Admit: 2012-01-12 | Discharge: 2012-01-14 | DRG: 660 | Disposition: A | Discharge status: Home / Self Care | Payer: MEDICAID | Source: Ambulatory Visit | Attending: Urology | Admitting: Urology

## 2012-01-12 ENCOUNTER — Other Ambulatory Visit: Payer: Self-pay | Admitting: Urology

## 2012-01-12 ENCOUNTER — Encounter (HOSPITAL_COMMUNITY): Payer: Self-pay | Admitting: Pharmacy Technician

## 2012-01-12 DIAGNOSIS — N1330 Unspecified hydronephrosis: Secondary | ICD-10-CM | POA: Diagnosis present

## 2012-01-12 DIAGNOSIS — Z87891 Personal history of nicotine dependence: Secondary | ICD-10-CM

## 2012-01-12 DIAGNOSIS — N201 Calculus of ureter: Principal | ICD-10-CM | POA: Diagnosis present

## 2012-01-12 DIAGNOSIS — I10 Essential (primary) hypertension: Secondary | ICD-10-CM | POA: Diagnosis present

## 2012-01-12 DIAGNOSIS — N200 Calculus of kidney: Secondary | ICD-10-CM | POA: Diagnosis present

## 2012-01-12 DIAGNOSIS — N289 Disorder of kidney and ureter, unspecified: Secondary | ICD-10-CM | POA: Diagnosis present

## 2012-01-12 HISTORY — PX: CYSTOSCOPY/RETROGRADE/URETEROSCOPY: SHX5316

## 2012-01-12 LAB — BASIC METABOLIC PANEL
Calcium: 9.5 mg/dL (ref 8.4–10.5)
GFR calc non Af Amer: 44 mL/min — ABNORMAL LOW (ref >90)
Sodium: 134 mEq/L — ABNORMAL LOW (ref 135–145)

## 2012-01-12 LAB — CBC WITH DIFFERENTIAL/PLATELET
Basophils Absolute: 0 10*3/uL (ref 0.0–0.1)
Eosinophils Absolute: 0.1 10*3/uL (ref 0.0–0.7)
Lymphocytes Relative: 20 % (ref 12–46)
Lymphs Abs: 0.8 10*3/uL (ref 0.7–4.0)
MCH: 28.2 pg (ref 26.0–34.0)
Neutrophils Relative %: 70 % (ref 43–77)
Platelets: 221 10*3/uL (ref 150–400)
RBC: 4.5 MIL/uL (ref 4.22–5.81)
RDW: 13.2 % (ref 11.5–15.5)
WBC: 4.2 10*3/uL (ref 4.0–10.5)

## 2012-01-12 LAB — PROTIME-INR: Prothrombin Time: 13.6 seconds (ref 11.6–15.2)

## 2012-01-12 LAB — APTT: aPTT: 52 seconds — ABNORMAL HIGH (ref 24–37)

## 2012-01-12 SURGERY — CYSTOSCOPY/RETROGRADE/URETEROSCOPY
Anesthesia: General | Site: Pelvis | Laterality: Right | Wound class: Clean Contaminated

## 2012-01-12 MED ORDER — SODIUM CHLORIDE 0.9 % IR SOLN
Status: DC | PRN
  Administered 2012-01-12: 3000 mL

## 2012-01-12 MED ORDER — FENTANYL CITRATE 0.05 MG/ML IJ SOLN
INTRAMUSCULAR | Status: DC | PRN
  Administered 2012-01-12 (×5): 50 ug via INTRAVENOUS

## 2012-01-12 MED ORDER — SODIUM CHLORIDE 0.9 % IV SOLN
INTRAVENOUS | Status: DC | PRN
  Administered 2012-01-12 (×2): via INTRAVENOUS

## 2012-01-12 MED ORDER — ACETAMINOPHEN 10 MG/ML IV SOLN
INTRAVENOUS | Status: DC | PRN
  Administered 2012-01-12: 1000 mg via INTRAVENOUS

## 2012-01-12 MED ORDER — ALBUTEROL SULFATE HFA 108 (90 BASE) MCG/ACT IN AERS
2.0000 | INHALATION_SPRAY | Freq: Four times a day (QID) | RESPIRATORY_TRACT | Status: DC | PRN
  Filled 2012-01-12: qty 6.7

## 2012-01-12 MED ORDER — OXYCODONE-ACETAMINOPHEN 5-325 MG PO TABS
1.0000 | ORAL_TABLET | ORAL | Status: DC | PRN
  Administered 2012-01-12 – 2012-01-14 (×4): 1 via ORAL
  Filled 2012-01-12 (×5): qty 1

## 2012-01-12 MED ORDER — IOHEXOL 300 MG/ML  SOLN
INTRAMUSCULAR | Status: AC
  Filled 2012-01-12: qty 1

## 2012-01-12 MED ORDER — IOHEXOL 300 MG/ML  SOLN
INTRAMUSCULAR | Status: DC | PRN
  Administered 2012-01-12: 10 mL

## 2012-01-12 MED ORDER — MIDAZOLAM HCL 5 MG/5ML IJ SOLN
INTRAMUSCULAR | Status: DC | PRN
  Administered 2012-01-12 (×2): 1 mg via INTRAVENOUS

## 2012-01-12 MED ORDER — LIDOCAINE HCL (CARDIAC) 20 MG/ML IV SOLN
INTRAVENOUS | Status: DC | PRN
  Administered 2012-01-12: 30 mg via INTRAVENOUS

## 2012-01-12 MED ORDER — PROPOFOL 10 MG/ML IV EMUL
INTRAVENOUS | Status: DC | PRN
  Administered 2012-01-12: 200 mg via INTRAVENOUS
  Administered 2012-01-12: 30 mg via INTRAVENOUS

## 2012-01-12 MED ORDER — DEXAMETHASONE SODIUM PHOSPHATE 4 MG/ML IJ SOLN
INTRAMUSCULAR | Status: DC | PRN
  Administered 2012-01-12: 10 mg via INTRAVENOUS

## 2012-01-12 MED ORDER — KCL IN DEXTROSE-NACL 10-5-0.45 MEQ/L-%-% IV SOLN
INTRAVENOUS | Status: DC
  Administered 2012-01-12 – 2012-01-14 (×3): via INTRAVENOUS
  Filled 2012-01-12 (×4): qty 1000

## 2012-01-12 MED ORDER — BELLADONNA ALKALOIDS-OPIUM 16.2-60 MG RE SUPP
RECTAL | Status: AC
  Filled 2012-01-12: qty 1

## 2012-01-12 MED ORDER — CEFAZOLIN SODIUM-DEXTROSE 2-3 GM-% IV SOLR
INTRAVENOUS | Status: AC
  Filled 2012-01-12: qty 50

## 2012-01-12 MED ORDER — CEFAZOLIN SODIUM 1-5 GM-% IV SOLN
1.0000 g | INTRAVENOUS | Status: AC
  Administered 2012-01-12: 2 g via INTRAVENOUS

## 2012-01-12 MED ORDER — SENNA 8.6 MG PO TABS
1.0000 | ORAL_TABLET | Freq: Two times a day (BID) | ORAL | Status: DC
  Administered 2012-01-12 – 2012-01-14 (×3): 8.6 mg via ORAL
  Filled 2012-01-12 (×6): qty 1

## 2012-01-12 MED ORDER — ONDANSETRON HCL 4 MG/2ML IJ SOLN: 4.0000 mg | INTRAMUSCULAR | Status: DC | PRN

## 2012-01-12 MED ORDER — MUPIROCIN 2 % EX OINT
TOPICAL_OINTMENT | Freq: Two times a day (BID) | CUTANEOUS | Status: DC
  Administered 2012-01-12: 1 via NASAL
  Filled 2012-01-12: qty 22

## 2012-01-12 MED ORDER — LIDOCAINE HCL 2 % EX GEL
CUTANEOUS | Status: AC
  Filled 2012-01-12: qty 10

## 2012-01-12 MED ORDER — ONDANSETRON HCL 4 MG/2ML IJ SOLN
INTRAMUSCULAR | Status: DC | PRN
  Administered 2012-01-12 (×2): 2 mg via INTRAVENOUS

## 2012-01-12 MED ORDER — HYDROMORPHONE HCL PF 1 MG/ML IJ SOLN
0.5000 mg | INTRAMUSCULAR | Status: DC | PRN
  Administered 2012-01-13 – 2012-01-14 (×5): 1 mg via INTRAVENOUS
  Filled 2012-01-12 (×5): qty 1

## 2012-01-12 MED ORDER — DOCUSATE SODIUM 100 MG PO CAPS
100.0000 mg | ORAL_CAPSULE | Freq: Two times a day (BID) | ORAL | Status: DC
  Administered 2012-01-12 – 2012-01-14 (×3): 100 mg via ORAL
  Filled 2012-01-12 (×6): qty 1

## 2012-01-12 MED ORDER — 0.9 % SODIUM CHLORIDE (POUR BTL) OPTIME
TOPICAL | Status: DC | PRN
  Administered 2012-01-12: 1000 mL

## 2012-01-12 MED ORDER — FENTANYL CITRATE 0.05 MG/ML IJ SOLN: 25.0000 ug | INTRAMUSCULAR | Status: DC | PRN

## 2012-01-12 MED ORDER — PROMETHAZINE HCL 25 MG/ML IJ SOLN: 6.2500 mg | INTRAMUSCULAR | Status: DC | PRN

## 2012-01-12 MED ORDER — ACETAMINOPHEN 10 MG/ML IV SOLN
INTRAVENOUS | Status: AC
  Filled 2012-01-12: qty 100

## 2012-01-12 SURGICAL SUPPLY — 19 items
ADAPTER CATH URET PLST 4-6FR (CATHETERS) ×3 IMPLANT
BAG URO CATCHER STRL LF (DRAPE) ×3 IMPLANT
BASKET LASER NITINOL 1.9FR (BASKET) IMPLANT
CATH BEACON 5.038 65CM KMP-01 (CATHETERS) ×3 IMPLANT
CATH INTERMIT  6FR 70CM (CATHETERS) ×3 IMPLANT
CLOTH BEACON ORANGE TIMEOUT ST (SAFETY) ×3 IMPLANT
DRAPE CAMERA CLOSED 9X96 (DRAPES) ×3 IMPLANT
GLOVE BIOGEL M STRL SZ7.5 (GLOVE) ×3 IMPLANT
GLOVE SURG SS PI 6.5 STRL IVOR (GLOVE) ×6 IMPLANT
GOWN PREVENTION PLUS XLARGE (GOWN DISPOSABLE) ×3 IMPLANT
GOWN STRL NON-REIN LRG LVL3 (GOWN DISPOSABLE) ×6 IMPLANT
GUIDEWIRE ANG ZIPWIRE 038X150 (WIRE) ×3 IMPLANT
GUIDEWIRE STR DUAL SENSOR (WIRE) ×3 IMPLANT
MANIFOLD NEPTUNE II (INSTRUMENTS) ×3 IMPLANT
PACK CYSTO (CUSTOM PROCEDURE TRAY) ×3 IMPLANT
SYR CONTROL 10ML LL (SYRINGE) ×3 IMPLANT
TUBE FEEDING 5FR 36IN KANGAROO (TUBING) IMPLANT
TUBE FEEDING 8FR 16IN STR KANG (MISCELLANEOUS) ×3 IMPLANT
TUBING CONNECTING 10 (TUBING) ×3 IMPLANT

## 2012-01-12 NOTE — Transfer of Care (Signed)
Immediate Anesthesia Transfer of Care Note  Patient: [name]  Procedure(s) Performed: Procedure(s) (LRB) with comments: CYSTOSCOPY/RETROGRADE/URETEROSCOPY (Right)  Patient Location: PACU  Anesthesia Type: General  Level of Consciousness: sedated  Airway & Oxygen Therapy: Patient Spontanous Breathing and Patient connected to face mask oxygen  Post-op Assessment: Report given to PACU RN and Post -op Vital signs reviewed and stable  Post vital signs: Reviewed and stable  Complications: No apparent anesthesia complications

## 2012-01-12 NOTE — Anesthesia Postprocedure Evaluation (Signed)
  Anesthesia Post-op Note  Patient: [name]  Procedure(s) Performed: Procedure(s) (LRB): CYSTOSCOPY/RETROGRADE/URETEROSCOPY (Right)  Patient Location: PACU  Anesthesia Type: General  Level of Consciousness: awake and alert   Airway and Oxygen Therapy: Patient Spontanous Breathing  Post-op Pain: mild  Post-op Assessment: Post-op Vital signs reviewed, Patient's Cardiovascular Status Stable, Respiratory Function Stable, Patent Airway and No signs of Nausea or vomiting  Post-op Vital Signs: stable  Complications: No apparent anesthesia complications

## 2012-01-12 NOTE — Anesthesia Preprocedure Evaluation (Signed)
Anesthesia Evaluation  Patient identified by MRN, date of birth, ID band Patient awake    Reviewed: Allergy & Precautions, H&P , NPO status , Patient's Chart, lab work & pertinent test results  Airway Mallampati: II TM Distance: >3 FB Neck ROM: Full    Dental No notable dental hx.    Pulmonary asthma ,  breath sounds clear to auscultation  Pulmonary exam normal       Cardiovascular hypertension, Pt. on medications Rhythm:Regular Rate:Normal     Neuro/Psych negative neurological ROS  negative psych ROS   GI/Hepatic negative GI ROS, Neg liver ROS,   Endo/Other  negative endocrine ROS  Renal/GU negative Renal ROS  negative genitourinary   Musculoskeletal negative musculoskeletal ROS (+)   Abdominal   Peds negative pediatric ROS (+)  Hematology negative hematology ROS (+)   Anesthesia Other Findings   Reproductive/Obstetrics negative OB ROS                           Anesthesia Physical Anesthesia Plan  ASA: II  Anesthesia Plan: General   Post-op Pain Management:    Induction: Intravenous  Airway Management Planned: LMA  Additional Equipment:   Intra-op Plan:   Post-operative Plan: Extubation in OR  Informed Consent: I have reviewed the patients History and Physical, chart, labs and discussed the procedure including the risks, benefits and alternatives for the proposed anesthesia with the patient or authorized representative who has indicated his/her understanding and acceptance.   Dental advisory given  Plan Discussed with: CRNA  Anesthesia Plan Comments:         Anesthesia Quick Evaluation

## 2012-01-12 NOTE — H&P (Signed)
[name] is an 36 y.o. male.   Chief Complaint: Pre-Op Rt Ureteroscopic Stone Manipulation HPI:   1 - Rt Nephrolithiasis - Pt with Rt UPJ 7mm and Intrarenal lower pole stones 9mm with hydro and refractory pain, first noted by CT at ER visit 01/04/2012. First stone episode. NO fevers / infectious parameters.  He was orrignally considering shockwave lithotripsy, but due to refractory pain, has now opted for ureteroscopy.     Past Medical History  Diagnosis Date  . Sarcoidosis   . Hypertension   . Asthma     Past Surgical History  Procedure Date  . None     Family History  Problem Relation Age of Onset  . Hypertension Mother   . Asthma Mother   . Diabetes Father   . Heart attack Father   . Cancer Paternal Grandmother    Social History:  reports that he quit smoking about 4 years ago. His smoking use included Cigarettes. He has a .4 pack-year smoking history. He has never used smokeless tobacco. He reports that he does not drink alcohol or use illicit drugs.  Allergies: No Known Allergies  No prescriptions prior to admission    No results found for this or any previous visit (from the past 48 hour(s)). No results found.  Review of Systems  Constitutional: Negative.  Negative for fever and chills.  HENT: Negative.   Eyes: Negative.   Respiratory: Negative.   Cardiovascular: Negative.   Gastrointestinal: Negative.   Genitourinary: Negative.   Musculoskeletal: Negative.   Skin: Negative.   Neurological: Negative.   Endo/Heme/Allergies: Negative.   Psychiatric/Behavioral: Negative.     There were no vitals taken for this visit. Physical Exam  Constitutional: He appears well-developed and well-nourished.  HENT:  Head: Normocephalic and atraumatic.  Eyes: Pupils are equal, round, and reactive to light.  Neck: Normal range of motion. Neck supple.  Cardiovascular: Normal rate.   Respiratory: Effort normal.  GI: Soft. Bowel sounds are normal.       Moderate Rt  CVAT  Genitourinary: Penis normal.  Musculoskeletal: Normal range of motion.  Neurological: He is alert.  Skin: Skin is warm and dry.  Psychiatric: He has a normal mood and affect. His behavior is normal. Judgment and thought content normal.     Assessment/Plan 1 - Rt Nephrolithiasis - We discussed ureteroscopic stone manipulation with basketing and laser-lithotripsy in detail.  We discussed risks including bleeding, infection, damage to kidney / ureter  bladder, rarely loss of kidney. We discussed anesthetic risks and rare but serious surgical complications including DVT, PE, MI, and mortality. We specifically addressed that in 5-10% of cases a staged approach is required with stenting followed by re-attempt ureteroscopy if anatomy unfavorable. The patient voiced understanding and wises to proceed.    Annamae Shivley 01/12/2012, 1:22 PM

## 2012-01-12 NOTE — Brief Op Note (Signed)
01/12/2012  6:05 PM  PATIENT:  [name]  36 y.o. male  PRE-OPERATIVE DIAGNOSIS:  right ureteral calculus  POST-OPERATIVE DIAGNOSIS:  right ureteral calculus  PROCEDURE:   1- Cysto with Right Retrograde Pyelogram 2 - Diagnostic Right Ureteroscopy  SURGEON:  Surgeon(s) and Role:    * Sebastian Ache, MD - Primary  PHYSICIAN ASSISTANT:   ASSISTANTS: none   ANESTHESIA:   general  EBL:  Total I/O In: 1000 [I.V.:1000] Out: 0   BLOOD ADMINISTERED:none  DRAINS: none   LOCAL MEDICATIONS USED:  NONE  SPECIMEN:  No Specimen  DISPOSITION OF SPECIMEN:  N/A  COUNTS:  YES  TOURNIQUET:  * No tourniquets in log *  DICTATION: .Other Dictation: Dictation Number [id number]  PLAN OF CARE: Admit to inpatient   PATIENT DISPOSITION:  PACU - hemodynamically stable.   Delay start of Pharmacological VTE agent (>24hrs) due to surgical blood loss or risk of bleeding: not applicable

## 2012-01-13 ENCOUNTER — Other Ambulatory Visit: Payer: Self-pay | Admitting: Urology

## 2012-01-13 ENCOUNTER — Encounter (HOSPITAL_COMMUNITY): Payer: Self-pay | Admitting: *Deleted

## 2012-01-13 ENCOUNTER — Ambulatory Visit (HOSPITAL_COMMUNITY): Payer: Self-pay

## 2012-01-13 MED ORDER — MIDAZOLAM HCL 2 MG/2ML IJ SOLN
INTRAMUSCULAR | Status: AC
  Filled 2012-01-13: qty 6

## 2012-01-13 MED ORDER — MIDAZOLAM HCL 2 MG/2ML IJ SOLN
INTRAMUSCULAR | Status: AC | PRN
  Administered 2012-01-13: 4 mg via INTRAVENOUS

## 2012-01-13 MED ORDER — CIPROFLOXACIN IN D5W 400 MG/200ML IV SOLN
INTRAVENOUS | Status: AC
  Filled 2012-01-13: qty 200

## 2012-01-13 MED ORDER — LIDOCAINE HCL 1 % IJ SOLN
INTRAMUSCULAR | Status: AC
  Filled 2012-01-13: qty 20

## 2012-01-13 MED ORDER — FENTANYL CITRATE 0.05 MG/ML IJ SOLN
INTRAMUSCULAR | Status: AC | PRN
  Administered 2012-01-13 (×2): 100 ug via INTRAVENOUS

## 2012-01-13 MED ORDER — PHENAZOPYRIDINE HCL 200 MG PO TABS
200.0000 mg | ORAL_TABLET | Freq: Three times a day (TID) | ORAL | Status: DC
  Administered 2012-01-13 – 2012-01-14 (×5): 200 mg via ORAL
  Filled 2012-01-13 (×6): qty 1

## 2012-01-13 MED ORDER — CIPROFLOXACIN IN D5W 400 MG/200ML IV SOLN
400.0000 mg | INTRAVENOUS | Status: AC
  Administered 2012-01-13: 400 mg via INTRAVENOUS

## 2012-01-13 MED ORDER — FENTANYL CITRATE 0.05 MG/ML IJ SOLN
INTRAMUSCULAR | Status: AC
  Filled 2012-01-13: qty 6

## 2012-01-13 NOTE — Progress Notes (Signed)
Patient c/o fullness in bladder area. Bladder scan recorded at least 900 mls urine. Patient is in 7/10 pain. Called Dr. Emmaline Life office and obtained order for I&O catheter. Pt received 1mg  Dilaudid prior to catheter insertion for comfort. Successfully removed 875 mls urine. Patient was relieved and is resting much better. I will continue to monitor his output during the rest of the shift.

## 2012-01-13 NOTE — Progress Notes (Signed)
Patient ID: [name], male   DOB: October 26, 1975, 36 y.o.   MRN: [id number] Request received for right percutaneous nephrostomy tube placement on pt with hx of right nephrolithiasis/hydronephrosis/mild renal insufficiency. Additional PMH as below. Exam: chest- CTA bilat, heart- RRR, abd- soft, +BS,mild pelvic tenderness, mild rt CVA tenderness, ext- no edema, FROM.     Filed Vitals:   01/12/12 1910 01/12/12 1920 01/12/12 2000 01/13/12 0505  BP: 153/95 147/94 141/91 134/85  Pulse: 72 70 80 59  Temp:  97.2 F (36.2 C) 97.5 F (36.4 C) 97.5 F (36.4 C)  TempSrc:    Oral  Resp: 13 15 16 16   Height:      Weight:      SpO2: 97% 98% 98% 97%   Past Medical History  Diagnosis Date  . Sarcoidosis   . Hypertension   . Asthma     last asthma attack last year in 2012   Past Surgical History  Procedure Date  . None   . Esophageal dilation    Ct Abdomen Pelvis Wo Contrast  01/04/2012  *RADIOLOGY REPORT*  Clinical Data: Right flank pain.  Nausea.  CT ABDOMEN AND PELVIS WITHOUT CONTRAST  Technique:  Multidetector CT imaging of the abdomen and pelvis was performed following the standard protocol without intravenous contrast.  Comparison: None.  Findings: Numerous tiny nodules project at the lung bases.  The appearance is probably secondary to the patient's history of sarcoidosis.  Mild interstitial accentuation noted at the lung bases.  Splenomegaly noted, estimated volume about 660 cc.  Right hydronephrosis noted secondary to a cluster of two adjacent UPJ calculi, each measuring up to about 9 mm in length.  There is a separate nonobstructive cluster of several calculi in the right kidney lower pole, with one calculus measuring 6 mm in one measuring 3 mm.  Right perirenal stranding noted. No distal ureteral calculus or bladder calculus observed.  No left-sided calculi identified.  Adrenal glands unremarkable.  Nodularity in the gastrohepatic ligament may be from adenopathy or possibly vascular  structures. Retroperitoneal lymph nodes are in the upper normal size range.  The appendix is poorly seen.  Small bilateral external iliac and inguinal lymph nodes noted.  Urinary bladder unremarkable.  No pelvic ascites noted.  Minimal levoconvex lumbar scoliosis noted.  IMPRESSION:  1.  Obstructive cluster of two large calculi at the right UPJ. 2.  Nonobstructive cluster of calculi in the right kidney lower pole. 3.  Innumerable faint nodules at the lung bases, likely a manifestation of the patient's sarcoidosis. 4.  Splenomegaly. 5.  Nodularity in the gastrohepatic ligament could be from varices or mild adenopathy.   Original Report Authenticated By: Dellia Cloud, M.D.   Results for orders placed during the hospital encounter of 01/12/12  SURGICAL PCR SCREEN      Component Value Range   MRSA, PCR NEGATIVE  NEGATIVE   Staphylococcus aureus NEGATIVE  NEGATIVE  BASIC METABOLIC PANEL      Component Value Range   Sodium 134 (*) 135 - 145 mEq/L   Potassium 4.3  3.5 - 5.1 mEq/L   Chloride 98  96 - 112 mEq/L   CO2 25  19 - 32 mEq/L   Glucose, Bld 84  70 - 99 mg/dL   BUN 23  6 - 23 mg/dL   Creatinine, Ser 4.09 (*) 0.50 - 1.35 mg/dL   Calcium 9.5  8.4 - 81.1 mg/dL   GFR calc non Af Amer 44 (*) >90 mL/min   GFR  calc Af Amer 51 (*) >90 mL/min  CBC WITH DIFFERENTIAL      Component Value Range   WBC 4.2  4.0 - 10.5 K/uL   RBC 4.50  4.22 - 5.81 MIL/uL   Hemoglobin 12.7 (*) 13.0 - 17.0 g/dL   HCT 95.2 (*) 84.1 - 32.4 %   MCV 81.3  78.0 - 100.0 fL   MCH 28.2  26.0 - 34.0 pg   MCHC 34.7  30.0 - 36.0 g/dL   RDW 40.1  02.7 - 25.3 %   Platelets 221  150 - 400 K/uL   Neutrophils Relative 70  43 - 77 %   Neutro Abs 2.9  1.7 - 7.7 K/uL   Lymphocytes Relative 20  12 - 46 %   Lymphs Abs 0.8  0.7 - 4.0 K/uL   Monocytes Relative 8  3 - 12 %   Monocytes Absolute 0.3  0.1 - 1.0 K/uL   Eosinophils Relative 2  0 - 5 %   Eosinophils Absolute 0.1  0.0 - 0.7 K/uL   Basophils Relative 0  0 - 1 %    Basophils Absolute 0.0  0.0 - 0.1 K/uL  APTT      Component Value Range   aPTT 52 (*) 24 - 37 seconds  PROTIME-INR      Component Value Range   Prothrombin Time 13.6  11.6 - 15.2 seconds   INR 1.05  0.00 - 1.49   A/P: Pt with hx of right nephrolithiasis/hydronephrosis/mild renal insufficiency. Plan is for right percutaneous nephrostomy tube placement today. Details/risks of procedure d/w pt with his understanding and consent.

## 2012-01-13 NOTE — Progress Notes (Signed)
Agree 

## 2012-01-13 NOTE — Procedures (Signed)
Procedure:  Right percutaneous nephrostomy Findings:  Moderate hydro.  10 Fr catheter formed in renal pelvis and connected to gravity bag.

## 2012-01-13 NOTE — Op Note (Signed)
NAMESAKAI, [name] NO.:  [id number]  MEDICAL RECORD NO.:  [id number]  LOCATION:  1424                         FACILITY:  [facility]  PHYSICIAN:  Sebastian Ache, MD     DATE OF BIRTH:  1976-03-31  DATE OF PROCEDURE: DATE OF DISCHARGE:                              OPERATIVE REPORT   DIAGNOSES:  Right ureteral stone, refractory flank pain.  PROCEDURE: 1. Cystoscopy with right retrograde pyelogram interpretation. 2. Right diagnostic ureteroscopy.  FINDINGS: 1. Completely impacted right proximal ureteral stone, unable to pass     wire beyond this or to visualize the stone ureteroscopically due to     significant impaction and tortuosity of the ureter. 2. Moderate hydronephrosis above this level. 3. No evidence of extravasation from the ureter following manipulation     on final retrograde pyelogram. 4. Otherwise unremarkable urinary bladder.  COMPLICATIONS:  None.  SPECIMENS:  None.  ESTIMATED BLOOD LOSS:  Nil.  DRAINS:  None.  INDICATION:  [name] is a pleasant 36 year old gentleman with first episode of significant right renal colic that was diagnosed by CT a little over week ago.  He was originally scheduled for shockwave lithotripsy, however, his pain became refractory.  Options were discussed including attempted ureteroscopic manipulation versus stenting and he wished to proceed.  Informed consent was obtained and placed in medical record.  PROCEDURE IN DETAIL:  The patient being [name], procedure being right ureteroscopic stone manipulation was confirmed.  Procedure was carried out.  Time-out was performed.  Intravenous antibiotics administered.  General LMA anesthesia was introduced.  The patient was placed into a low lithotomy position.  Sterile field was created prepping and draping the patient's penis, perineum, and proximal thighs using iodine x3.  Next cystourethroscopy was performed using a 22-French rigid cystoscope with 30-degrees  lens.  Inspection of the anterior and posterior urethra were unremarkable.  Inspection of the urinary bladder revealed no diverticula, calcifications, or papular lesions.  Ureteral orifices were in the normal anatomic position.  Next, right ureteral orifice was gently cannulated using a 6-French angled catheter and right retrograde pyelogram was obtained.  Right retrograde pyelogram demonstrated a single right ureter with prominent filling defect in the proximal ureter with very little contrast passing proximal to this that did reveal moderate hydronephrosis above this.  This was consistent with impacted stone.  Next, a 0.038 Glidewire was advanced, but was unable to be advanced beyond the stone despite trying multiple angulations.  Similarly a Sensor wire was attempted to pass but not go above the stone signifying significant impaction.  Given this finding, I felt that an attempted ureteroscopy to visualize the stone with possible fragmentation to allow wire passage was warranted.  As such, the Glidewire was left in place as a safety wire.  The cystoscope was exchanged for the 6.5-French short semirigid ureteroscope.  A semi-rigid ureteroscopy was performed at the distal 2/3rd of the ureter alongside of a sensor working wire. Inspection of the distal ureter revealed no calcifications, papular lesions, or mucosal abnormalities.  The ureter was somewhat tortuous and narrowed towards the area of the proximal ureter and the length of the semi-rigid ureteroscope would not allow passage to the  area of the stone.  As such, it was exchanged to a long version of 6.5-French semi- rigid ureteroscope similarly using a 2 wire technique and this was advanced towards the area of the stone.  Due to significant impaction and tortuosity in this location, the stone was not able to be visualized with semi-rigid ureteroscopy.  As such, the long semi-rigid ureteroscope was exchanged for a 6.5-French flexible  ureteroscope which was passed over the working wire alongside the safety wire to the area of the proximal ureter.  Flexible ureteroscopy was performed and similarly tortuosity and narrowing of the ureter.  The stone could not be visualized.  Final attempt to advance wire passed the stone was performed using an angle tipped angiographic type catheter with the Glidewire once again with multiple different angulations and the wire was not able to pass the stone.  It was felt that we exhausted all safe means of attempted ureteroscopy with stenting and thus management with percutaneous approach would be most prudent.  As such, bladder was emptied per cystoscope, the wire was removed, the patient was awakened with plan for pain control overnight with nephrostomy tube in the morning.          ______________________________ Sebastian Ache, MD     TM/MEDQ  D:  01/12/2012  T:  01/13/2012  Job:  [id number]

## 2012-01-13 NOTE — Progress Notes (Signed)
1 Day Post-Op  Subjective: 1 - Impacted Rt Ureteral + Renal Stone - POD 1 s/p attemtped endoscopic management. Rt nephrostomy placed today by IR with plan for elective percutaneous approach stone surgery in the coming weeks. Pain controlled, ambulatory.   Objective: Vital signs in last 24 hours: Temp:  [97.2 F (36.2 C)-98 F (36.7 C)] 97.4 F (36.3 C) (10/10 1720) Pulse Rate:  [59-105] 105  (10/10 1720) Resp:  [7-18] 7  (10/10 1618) BP: (112-153)/(83-109) 145/97 mmHg (10/10 1720) SpO2:  [96 %-100 %] 100 % (10/10 1720)    Intake/Output from previous day: 10/09 0701 - 10/10 0700 In: 1100 [I.V.:1100] Out: 875 [Urine:875] Intake/Output this shift: Total I/O In: -  Out: 600 [Urine:600]  General appearance: alert, cooperative, appears stated age and Finacee at bedside Head: Normocephalic, without obvious abnormality, atraumatic Eyes: conjunctivae/corneas clear. PERRL, EOM's intact. Fundi benign. Ears: normal TM's and external ear canals both ears Neck: no adenopathy, no carotid bruit, no JVD, supple, symmetrical, trachea midline and thyroid not enlarged, symmetric, no tenderness/mass/nodules Back: symmetric, no curvature. ROM normal. No CVA tenderness. Resp: clear to auscultation bilaterally Chest wall: no tenderness Cardio: regular rate and rhythm, S1, S2 normal, no murmur, click, rub or gallop GI: soft, non-tender; bowel sounds normal; no masses,  no organomegaly Male genitalia: normal Extremities: extremities normal, atraumatic, no cyanosis or edema Pulses: 2+ and symmetric Skin: Skin color, texture, turgor normal. No rashes or lesions Lymph nodes: Cervical, supraclavicular, and axillary nodes normal. Neurologic: Grossly normal Incision/Wound: Rt neph tube with light pink urine output. No clots. Skin site w/o hematoma or TTP.  Lab Results:   [facility] 01/12/12 1853  WBC 4.2  HGB 12.7*  HCT 36.6*  PLT 221   BMET  Basename 01/12/12 1853  NA 134*  K 4.3  CL 98  CO2  25  GLUCOSE 84  BUN 23  CREATININE 1.87*  CALCIUM 9.5   PT/INR  Basename 01/12/12 1853  LABPROT 13.6  INR 1.05   ABG No results found for this basename: PHART:2,PCO2:2,PO2:2,HCO3:2 in the last 72 hours  Studies/Results: No results found.  Anti-infectives: Anti-infectives     Start     Dose/Rate Route Frequency Ordered Stop   01/13/12 1534   ciprofloxacin (CIPRO) 400 MG/200ML IVPB     Comments: DUININCK, STACEY: cabinet override         01/13/12 1534 01/14/12 0344   01/13/12 1400   ciprofloxacin (CIPRO) IVPB 400 mg     Comments: GIVE ON CALL TO IR 10/10 FOR NEPHROSTOMY TUBE PLACEMENT      400 mg 200 mL/hr over 60 Minutes Intravenous On call 01/13/12 1106 01/13/12 1644   01/12/12 1442   ceFAZolin (ANCEF) IVPB 1 g/50 mL premix        1 g 100 mL/hr over 30 Minutes Intravenous 30 min pre-op 01/12/12 1442 01/12/12 1710          Assessment/Plan: 1 - Impacted Rt Ureteral + Renal Stone - Observe overnight tonight, as recent new neph tube stick. Probably DC in AM with tentative definitive stone surgery (percutaneous) planned for 11/1.   Recheck BMP, Hgb in AM to assure hgb and Cr stability.  LOS: 1 day    [facility], Trinitie Mcgirr 01/13/2012

## 2012-01-14 LAB — BASIC METABOLIC PANEL
BUN: 24 mg/dL — ABNORMAL HIGH (ref 6–23)
Chloride: 99 mEq/L (ref 96–112)
GFR calc non Af Amer: 57 mL/min — ABNORMAL LOW (ref >90)
Glucose, Bld: 108 mg/dL — ABNORMAL HIGH (ref 70–99)
Potassium: 4 mEq/L (ref 3.5–5.1)

## 2012-01-14 MED ORDER — SULFAMETHOXAZOLE-TRIMETHOPRIM 400-80 MG PO TABS: 1.0000 | ORAL_TABLET | Freq: Two times a day (BID) | ORAL | Status: DC

## 2012-01-14 MED ORDER — SENNA-DOCUSATE SODIUM 8.6-50 MG PO TABS: 1.0000 | ORAL_TABLET | Freq: Two times a day (BID) | ORAL | Status: DC

## 2012-01-14 MED ORDER — PHENAZOPYRIDINE HCL 200 MG PO TABS: 200.0000 mg | ORAL_TABLET | Freq: Three times a day (TID) | ORAL | Status: DC | PRN

## 2012-01-14 MED ORDER — OXYCODONE-ACETAMINOPHEN 5-325 MG PO TABS: 1.0000 | ORAL_TABLET | ORAL | Status: DC | PRN

## 2012-01-14 NOTE — Progress Notes (Signed)
Subjective: Pt ok, c/o mild soreness at PCN site  Objective: Physical Exam: BP 117/79  Pulse 78  Temp 99 F (37.2 C) (Oral)  Resp 16  Ht 5\' 11"  (1.803 m)  Wt 157 lb 2 oz (71.271 kg)  BMI 21.91 kg/m2  SpO2 99% (R)PCN intact, site clean, minimally tender Good UOP    Labs: CBC  Basename 01/14/12 0358 01/12/12 1853  WBC -- 4.2  HGB 12.3* 12.7*  HCT 35.8* 36.6*  PLT -- 221   BMET  Basename 01/14/12 0358 01/12/12 1853  NA 134* 134*  K 4.0 4.3  CL 99 98  CO2 24 25  GLUCOSE 108* 84  BUN 24* 23  CREATININE 1.50* 1.87*  CALCIUM 9.3 9.5   LFT No results found for this basename: PROT,ALBUMIN,AST,ALT,ALKPHOS,BILITOT,BILIDIR,IBILI,LIPASE in the last 72 hours PT/INR  Basename 01/12/12 1853  LABPROT 13.6  INR 1.05     Studies/Results: No results found.  Assessment/Plan: S/p (R)PCN Reviewed care instructions For DC today    LOS: 2 days    Brayton El PA-C 01/14/2012 8:32 AM

## 2012-01-14 NOTE — Progress Notes (Signed)
Reviewed with pt care of nephrostomy tube.  Pt verbalizes and demonstrates understanding. Maeola Harman

## 2012-01-14 NOTE — Discharge Summary (Signed)
Physician Discharge Summary  Patient ID: [name] MRN: [id number] DOB/AGE: Mar 04, 1976 36 y.o.  Admit date: 01/12/2012 Discharge date: 01/14/2012  Admission Diagnoses: Rt Ureteral and Renal Stones  Discharge Diagnoses: Rt Ureteral and Renal Stones   Discharged Condition: good  Hospital Course: Pt underwent attempted rt ureteroscopic laser lithotripsy 01/12/2012 however due to severe stone impaction the procedure was abandoned. He subsequently underwent rt nephrostomy tube placement 10/10 to decompress kidney and in preparation for subsequent percutaneous management of his stone. By 10/11, the day of discharge, he Cr was improving, his Hgb was stable, his pain was controlled, he was ambulatory and tolerating a diet and felt to be adequate for discharge.   Consults: Interventional Radiology  Significant Diagnostic Studies: radiology: retrograde pylogram and anterograde nephrostogram with impacted ureteral stone  Treatments: IV hydration and surgery: Rt ureteroscopy (diagnostic) 10/9.  Discharge Exam: Blood pressure 117/79, pulse 78, temperature 99 F (37.2 C), temperature source Oral, resp. rate 16, height 5\' 11"  (1.803 m), weight 71.271 kg (157 lb 2 oz), SpO2 99.00%. General appearance: alert, cooperative, appears stated age and fiancee at bedside Head: Normocephalic, without obvious abnormality, atraumatic Eyes: conjunctivae/corneas clear. PERRL, EOM's intact. Fundi benign. Ears: normal TM's and external ear canals both ears Throat: lips, mucosa, and tongue normal; teeth and gums normal Neck: no adenopathy, no carotid bruit, no JVD, supple, symmetrical, trachea midline and thyroid not enlarged, symmetric, no tenderness/mass/nodules Chest wall: no tenderness Cardio: regular rate and rhythm, S1, S2 normal, no murmur, click, rub or gallop GI: soft, non-tender; bowel sounds normal; no masses,  no organomegaly Male genitalia: normal Extremities: extremities normal, atraumatic, no  cyanosis or edema Pulses: 2+ and symmetric Skin: Skin color, texture, turgor normal. No rashes or lesions Lymph nodes: Cervical, supraclavicular, and axillary nodes normal. Neurologic: Alert and oriented X 3, normal strength and tone. Normal symmetric reflexes. Normal coordination and gait Incision/Wound: Rt neph tube c/d/i with light pink urine.   Disposition: 01-Home or Self Care     Medication List     As of 01/14/2012  8:27 AM    STOP taking these medications         ibuprofen 200 MG tablet   Commonly known as: ADVIL,MOTRIN      losartan 50 MG tablet   Commonly known as: COZAAR      TAKE these medications         oxyCODONE-acetaminophen 5-325 MG per tablet   Commonly known as: PERCOCET/ROXICET   Take 1 tablet by mouth every 4 (four) hours as needed for pain.      phenazopyridine 200 MG tablet   Commonly known as: PYRIDIUM   Take 1 tablet (200 mg total) by mouth 3 (three) times daily as needed for pain. (burning in urinary tract)      sennosides-docusate sodium 8.6-50 MG tablet   Commonly known as: SENOKOT-S   Take 1 tablet by mouth 2 (two) times daily. While taking pain meds to prevent constipation      sulfamethoxazole-trimethoprim 400-80 MG per tablet   Commonly known as: BACTRIM,SEPTRA   Take 1 tablet by mouth 2 (two) times daily. Begin 3 days before next surgery.      VENTOLIN HFA 108 (90 BASE) MCG/ACT inhaler   Generic drug: albuterol   Inhale 2 puffs into the lungs every 6 (six) hours as needed. For wheezing or shortness of breath           Follow-up Information    Follow up with Sebastian Ache, MD. (For surgery  02/04/2012. You will recieve call from our office of any change in date.)    Contact information:   [address] [facility] [place] [address] [phone]          Signed: Sebastian Ache 01/14/2012, 8:27 AM

## 2012-02-03 ENCOUNTER — Encounter (HOSPITAL_COMMUNITY): Payer: Self-pay | Admitting: Pharmacy Technician

## 2012-02-08 ENCOUNTER — Encounter (HOSPITAL_COMMUNITY): Payer: Self-pay

## 2012-02-08 ENCOUNTER — Encounter (HOSPITAL_COMMUNITY)
Admission: RE | Admit: 2012-02-08 | Discharge: 2012-02-08 | Disposition: A | Discharge status: Home / Self Care | Payer: Self-pay | Source: Ambulatory Visit | Attending: Urology | Admitting: Urology

## 2012-02-08 LAB — CBC
Hemoglobin: 13.3 g/dL (ref 13.0–17.0)
RBC: 4.61 MIL/uL (ref 4.22–5.81)
WBC: 4.1 10*3/uL (ref 4.0–10.5)

## 2012-02-08 LAB — BASIC METABOLIC PANEL
CO2: 27 mEq/L (ref 19–32)
Chloride: 102 mEq/L (ref 96–112)
Glucose, Bld: 84 mg/dL (ref 70–99)
Potassium: 4 mEq/L (ref 3.5–5.1)
Sodium: 138 mEq/L (ref 135–145)

## 2012-02-08 LAB — SURGICAL PCR SCREEN
MRSA, PCR: NEGATIVE
Staphylococcus aureus: NEGATIVE

## 2012-02-08 NOTE — Progress Notes (Signed)
States at PST visit that percutaneous tube- right- has been placed

## 2012-02-08 NOTE — Progress Notes (Signed)
Chest x ray 1/13 EPIC

## 2012-02-08 NOTE — Patient Instructions (Signed)
20 [name]  02/08/2012   Your procedure is scheduled on:  02/11/12  Friday  Surgery [id number]  Report to [facility] at    0945   AM.  Call this number if you have problems the morning of surgery: [phone]      Remember:   Do not eat food  Or drink :After Midnight. Thursday night   Take these medicines the morning of surgery with A SIP OF WATER:   Percocet if needed   .  Contacts, dentures or partial plates can not be worn to surgery  Leave suitcase in the car. After surgery it may be brought to your room.  For patients admitted to the hospital, checkout time is 11:00 AM day of  discharge.             SPECIAL INSTRUCTIONS- SEE [facility] PREPARING FOR SURGERY INSTRUCTION SHEET-     DO NOT WEAR JEWELRY, LOTIONS, POWDERS, OR PERFUMES.  WOMEN-- DO NOT SHAVE LEGS OR UNDERARMS FOR 12 HOURS BEFORE SHOWERS. MEN MAY SHAVE FACE.  Patients discharged the day of surgery will not be allowed to drive home. IF going home the day of surgery, you must have a driver and someone to stay with you for the first 24 hours  Name and phone number of your driver:  [name]   friend                                                                       Please read over the following fact sheets that you were given: MRSA Information, Incentive Spirometry Sheet, Blood Transfusion Sheet  Information                                                                                   [name]  PST [phone]  [phone]

## 2012-02-11 ENCOUNTER — Encounter (HOSPITAL_COMMUNITY): Payer: Self-pay | Admitting: *Deleted

## 2012-02-11 ENCOUNTER — Ambulatory Visit (HOSPITAL_COMMUNITY): Payer: Self-pay

## 2012-02-11 ENCOUNTER — Observation Stay (HOSPITAL_COMMUNITY)
Admission: RE | Admit: 2012-02-11 | Discharge: 2012-02-12 | Disposition: A | Discharge status: Home / Self Care | Payer: Self-pay | Source: Ambulatory Visit | Attending: Urology | Admitting: Urology

## 2012-02-11 ENCOUNTER — Ambulatory Visit (HOSPITAL_COMMUNITY): Payer: Self-pay | Admitting: Anesthesiology

## 2012-02-11 ENCOUNTER — Encounter (HOSPITAL_COMMUNITY)
Admission: RE | Disposition: A | Discharge status: Home / Self Care | Payer: Self-pay | Source: Ambulatory Visit | Attending: Urology

## 2012-02-11 ENCOUNTER — Encounter (HOSPITAL_COMMUNITY): Payer: Self-pay | Admitting: Anesthesiology

## 2012-02-11 DIAGNOSIS — N200 Calculus of kidney: Secondary | ICD-10-CM | POA: Insufficient documentation

## 2012-02-11 DIAGNOSIS — N201 Calculus of ureter: Principal | ICD-10-CM | POA: Insufficient documentation

## 2012-02-11 DIAGNOSIS — Z01812 Encounter for preprocedural laboratory examination: Secondary | ICD-10-CM | POA: Insufficient documentation

## 2012-02-11 DIAGNOSIS — I10 Essential (primary) hypertension: Secondary | ICD-10-CM | POA: Insufficient documentation

## 2012-02-11 DIAGNOSIS — J45909 Unspecified asthma, uncomplicated: Secondary | ICD-10-CM | POA: Insufficient documentation

## 2012-02-11 HISTORY — PX: NEPHROLITHOTOMY: SHX5134

## 2012-02-11 LAB — HEMOGLOBIN AND HEMATOCRIT, BLOOD
HCT: 34.7 % — ABNORMAL LOW (ref 39.0–52.0)
Hemoglobin: 12 g/dL — ABNORMAL LOW (ref 13.0–17.0)

## 2012-02-11 LAB — BASIC METABOLIC PANEL
Calcium: 9.3 mg/dL (ref 8.4–10.5)
GFR calc Af Amer: 65 mL/min — ABNORMAL LOW (ref >90)
GFR calc non Af Amer: 56 mL/min — ABNORMAL LOW (ref >90)
Glucose, Bld: 97 mg/dL (ref 70–99)
Potassium: 4.2 mEq/L (ref 3.5–5.1)
Sodium: 134 mEq/L — ABNORMAL LOW (ref 135–145)

## 2012-02-11 SURGERY — NEPHROLITHOTOMY PERCUTANEOUS
Anesthesia: General | Site: Flank | Laterality: Right | Wound class: Clean Contaminated

## 2012-02-11 MED ORDER — ROCURONIUM BROMIDE 100 MG/10ML IV SOLN
INTRAVENOUS | Status: DC | PRN
  Administered 2012-02-11: 50 mg via INTRAVENOUS
  Administered 2012-02-11 (×2): 10 mg via INTRAVENOUS

## 2012-02-11 MED ORDER — SENNA 8.6 MG PO TABS
1.0000 | ORAL_TABLET | Freq: Two times a day (BID) | ORAL | Status: DC
  Administered 2012-02-11: 8.6 mg via ORAL
  Filled 2012-02-11: qty 1

## 2012-02-11 MED ORDER — LACTATED RINGERS IV SOLN
INTRAVENOUS | Status: DC | PRN
  Administered 2012-02-11 (×2): via INTRAVENOUS

## 2012-02-11 MED ORDER — SULFAMETHOXAZOLE-TRIMETHOPRIM 400-80 MG PO TABS: 1.0000 | ORAL_TABLET | Freq: Two times a day (BID) | ORAL | Status: DC

## 2012-02-11 MED ORDER — SENNA-DOCUSATE SODIUM 8.6-50 MG PO TABS: 1.0000 | ORAL_TABLET | Freq: Two times a day (BID) | ORAL | Status: DC

## 2012-02-11 MED ORDER — OXYCODONE-ACETAMINOPHEN 5-325 MG PO TABS
1.0000 | ORAL_TABLET | ORAL | Status: DC | PRN
  Administered 2012-02-11 – 2012-02-12 (×3): 2 via ORAL
  Filled 2012-02-11 (×4): qty 2

## 2012-02-11 MED ORDER — PROMETHAZINE HCL 25 MG/ML IJ SOLN: 6.2500 mg | INTRAMUSCULAR | Status: DC | PRN

## 2012-02-11 MED ORDER — FENTANYL CITRATE 0.05 MG/ML IJ SOLN
INTRAMUSCULAR | Status: DC | PRN
  Administered 2012-02-11: 50 ug via INTRAVENOUS
  Administered 2012-02-11 (×3): 100 ug via INTRAVENOUS

## 2012-02-11 MED ORDER — HYDROMORPHONE HCL PF 1 MG/ML IJ SOLN
INTRAMUSCULAR | Status: DC | PRN
  Administered 2012-02-11: 1 mg via INTRAVENOUS
  Administered 2012-02-11 (×2): 0.5 mg via INTRAVENOUS

## 2012-02-11 MED ORDER — DEXAMETHASONE SODIUM PHOSPHATE 10 MG/ML IJ SOLN
INTRAMUSCULAR | Status: DC | PRN
  Administered 2012-02-11: 8 mg via INTRAVENOUS

## 2012-02-11 MED ORDER — ONDANSETRON HCL 4 MG/2ML IJ SOLN: 4.0000 mg | INTRAMUSCULAR | Status: DC | PRN

## 2012-02-11 MED ORDER — KCL IN DEXTROSE-NACL 10-5-0.45 MEQ/L-%-% IV SOLN
INTRAVENOUS | Status: DC
  Administered 2012-02-11: 50 mL/h via INTRAVENOUS
  Filled 2012-02-11 (×2): qty 1000

## 2012-02-11 MED ORDER — HYDROMORPHONE HCL PF 1 MG/ML IJ SOLN
INTRAMUSCULAR | Status: AC
  Filled 2012-02-11: qty 1

## 2012-02-11 MED ORDER — IOHEXOL 300 MG/ML  SOLN
INTRAMUSCULAR | Status: DC | PRN
  Administered 2012-02-11: 70 mL

## 2012-02-11 MED ORDER — GENTAMICIN SULFATE 40 MG/ML IJ SOLN
360.0000 mg | Freq: Once | INTRAVENOUS | Status: AC
  Administered 2012-02-11: 360 mg via INTRAVENOUS
  Filled 2012-02-11: qty 9

## 2012-02-11 MED ORDER — HYDROMORPHONE HCL PF 1 MG/ML IJ SOLN
0.2500 mg | INTRAMUSCULAR | Status: DC | PRN
  Administered 2012-02-11 (×3): 0.5 mg via INTRAVENOUS

## 2012-02-11 MED ORDER — ACETAMINOPHEN 10 MG/ML IV SOLN
INTRAVENOUS | Status: AC
  Filled 2012-02-11: qty 100

## 2012-02-11 MED ORDER — SULFAMETHOXAZOLE-TMP DS 800-160 MG PO TABS
1.0000 | ORAL_TABLET | Freq: Two times a day (BID) | ORAL | Status: DC
  Administered 2012-02-11 – 2012-02-12 (×2): 1 via ORAL
  Filled 2012-02-11 (×3): qty 1

## 2012-02-11 MED ORDER — GLYCOPYRROLATE 0.2 MG/ML IJ SOLN
INTRAMUSCULAR | Status: DC | PRN
  Administered 2012-02-11: 0.6 mg via INTRAVENOUS

## 2012-02-11 MED ORDER — HYDROMORPHONE HCL PF 1 MG/ML IJ SOLN
0.5000 mg | INTRAMUSCULAR | Status: DC | PRN
  Administered 2012-02-11 – 2012-02-12 (×3): 1 mg via INTRAVENOUS
  Filled 2012-02-11 (×3): qty 1

## 2012-02-11 MED ORDER — MIDAZOLAM HCL 5 MG/5ML IJ SOLN
INTRAMUSCULAR | Status: DC | PRN
  Administered 2012-02-11: 2 mg via INTRAVENOUS

## 2012-02-11 MED ORDER — PROPOFOL 10 MG/ML IV BOLUS
INTRAVENOUS | Status: DC | PRN
  Administered 2012-02-11: 200 mg via INTRAVENOUS

## 2012-02-11 MED ORDER — IOHEXOL 300 MG/ML  SOLN
INTRAMUSCULAR | Status: AC
  Filled 2012-02-11: qty 2

## 2012-02-11 MED ORDER — ACETAMINOPHEN 10 MG/ML IV SOLN
INTRAVENOUS | Status: DC | PRN
  Administered 2012-02-11: 1000 mg via INTRAVENOUS

## 2012-02-11 MED ORDER — NEOSTIGMINE METHYLSULFATE 1 MG/ML IJ SOLN
INTRAMUSCULAR | Status: DC | PRN
  Administered 2012-02-11: 4 mg via INTRAVENOUS

## 2012-02-11 MED ORDER — OXYCODONE-ACETAMINOPHEN 5-325 MG PO TABS: 1.0000 | ORAL_TABLET | ORAL | Status: DC | PRN

## 2012-02-11 MED ORDER — ESMOLOL HCL 10 MG/ML IV SOLN
INTRAVENOUS | Status: DC | PRN
  Administered 2012-02-11: 20 mg via INTRAVENOUS

## 2012-02-11 MED ORDER — ONDANSETRON HCL 4 MG/2ML IJ SOLN
INTRAMUSCULAR | Status: DC | PRN
  Administered 2012-02-11: 4 mg via INTRAVENOUS

## 2012-02-11 MED ORDER — DOCUSATE SODIUM 100 MG PO CAPS
100.0000 mg | ORAL_CAPSULE | Freq: Two times a day (BID) | ORAL | Status: DC
  Administered 2012-02-11 – 2012-02-12 (×2): 100 mg via ORAL
  Filled 2012-02-11 (×3): qty 1

## 2012-02-11 MED ORDER — SODIUM CHLORIDE 0.9 % IR SOLN
Status: DC | PRN
  Administered 2012-02-11: 6000 mL

## 2012-02-11 MED ORDER — LIDOCAINE HCL (CARDIAC) 20 MG/ML IV SOLN
INTRAVENOUS | Status: DC | PRN
  Administered 2012-02-11: 75 mg via INTRAVENOUS

## 2012-02-11 SURGICAL SUPPLY — 46 items
BAG URINE DRAINAGE (UROLOGICAL SUPPLIES) IMPLANT
BASKET LASER NITINOL 1.9FR (BASKET) ×2 IMPLANT
BASKET ZERO TIP NITINOL 2.4FR (BASKET) IMPLANT
BENZOIN TINCTURE PRP APPL 2/3 (GAUZE/BANDAGES/DRESSINGS) ×2 IMPLANT
BLADE SURG 15 STRL LF DISP TIS (BLADE) ×1 IMPLANT
BLADE SURG 15 STRL SS (BLADE) ×1
CATH BEACON 5.038 65CM KMP-01 (CATHETERS) ×2 IMPLANT
CATH FOLEY 2W COUNCIL 20FR 5CC (CATHETERS) IMPLANT
CATH ROBINSON RED A/P 20FR (CATHETERS) IMPLANT
CATH X-FORCE N30 NEPHROSTOMY (TUBING) ×2 IMPLANT
CLOTH BEACON ORANGE TIMEOUT ST (SAFETY) ×2 IMPLANT
COVER SURGICAL LIGHT HANDLE (MISCELLANEOUS) ×2 IMPLANT
DRAPE C-ARM 42X72 X-RAY (DRAPES) ×2 IMPLANT
DRAPE CAMERA CLOSED 9X96 (DRAPES) ×2 IMPLANT
DRAPE LINGEMAN PERC (DRAPES) ×2 IMPLANT
DRAPE SURG IRRIG POUCH 19X23 (DRAPES) ×2 IMPLANT
DRSG TEGADERM 8X12 (GAUZE/BANDAGES/DRESSINGS) IMPLANT
FIBER LASER TRAC TIP (UROLOGICAL SUPPLIES) ×2 IMPLANT
GLOVE BIOGEL M 8.0 STRL (GLOVE) IMPLANT
GOWN STRL REIN XL XLG (GOWN DISPOSABLE) ×2 IMPLANT
GUIDEWIRE AMPLAZ .035X145 (WIRE) ×2 IMPLANT
GUIDEWIRE ANG ZIPWIRE 038X150 (WIRE) ×2 IMPLANT
GUIDEWIRE STR DUAL SENSOR (WIRE) ×2 IMPLANT
KIT BASIN OR (CUSTOM PROCEDURE TRAY) ×2 IMPLANT
LASER FIBER DISP (UROLOGICAL SUPPLIES) IMPLANT
LASER FIBER DISP 1000U (UROLOGICAL SUPPLIES) IMPLANT
MANIFOLD NEPTUNE II (INSTRUMENTS) ×2 IMPLANT
NS IRRIG 1000ML POUR BTL (IV SOLUTION) ×2 IMPLANT
PACK BASIC VI WITH GOWN DISP (CUSTOM PROCEDURE TRAY) ×2 IMPLANT
PAD ABD 7.5X8 STRL (GAUZE/BANDAGES/DRESSINGS) IMPLANT
PROBE LITHOCLAST ULTRA 3.8X403 (UROLOGICAL SUPPLIES) IMPLANT
PROBE PNEUMATIC 1.0MMX570MM (UROLOGICAL SUPPLIES) IMPLANT
SET IRRIG Y TYPE TUR BLADDER L (SET/KITS/TRAYS/PACK) ×2 IMPLANT
SET WARMING FLUID IRRIGATION (MISCELLANEOUS) IMPLANT
SHEATH PEELAWAY SET 9 (SHEATH) ×2 IMPLANT
SPONGE GAUZE 4X4 12PLY (GAUZE/BANDAGES/DRESSINGS) IMPLANT
SPONGE LAP 4X18 X RAY DECT (DISPOSABLE) ×2 IMPLANT
STONE CATCHER W/TUBE ADAPTER (UROLOGICAL SUPPLIES) IMPLANT
SUT SILK 2 0 30  PSL (SUTURE) ×1
SUT SILK 2 0 30 PSL (SUTURE) ×1 IMPLANT
SYR 20CC LL (SYRINGE) ×2 IMPLANT
SYRINGE 10CC LL (SYRINGE) ×2 IMPLANT
TRAY FOLEY BAG SILVER LF 14FR (CATHETERS) IMPLANT
TRAY FOLEY CATH 14FRSI W/METER (CATHETERS) ×2 IMPLANT
TUBING CONNECTING 10 (TUBING) ×4 IMPLANT
WATER STERILE IRR 1500ML POUR (IV SOLUTION) ×2 IMPLANT

## 2012-02-11 NOTE — Brief Op Note (Signed)
02/11/2012  2:43 PM  PATIENT:  [name]  36 y.o. male  PRE-OPERATIVE DIAGNOSIS:  RIGHT RENAL AND URETERAL STONES  POST-OPERATIVE DIAGNOSIS:  RIGHT RENAL AND URETERAL STONES  PROCEDURE:  Procedure(s) (LRB) with comments: NEPHROLITHOTOMY PERCUTANEOUS (Right)  SURGEON:  Surgeon(s) and Role:    * Sebastian Ache, MD - Primary  PHYSICIAN ASSISTANT:   ASSISTANTS: none   ANESTHESIA:   general  EBL:  Total I/O In: 1000 [I.V.:1000] Out: 175 [Urine:175]  BLOOD ADMINISTERED:none  DRAINS: 1 - Foley, 2 - Rt nephroureteral stent (capped)   LOCAL MEDICATIONS USED:  NONE  SPECIMEN:  Source of Specimen:  Rt proximal ureter stone   DISPOSITION OF SPECIMEN:  [facility] for [facility]position[facility] Analysis  COUNTS:  YES  TOURNIQUET:  * No tourniquets in log *  DICTATION: .Other Dictation: Dictation Number   [id number]  PLAN OF CARE: Admit for overnight observation  PATIENT DISPOSITION:  PACU - hemodynamically stable.   Delay start of Pharmacological VTE agent (>24hrs) due to surgical blood loss or risk of bleeding: no

## 2012-02-11 NOTE — Care Management Note (Unsigned)
    Page 1 of 1   02/11/2012     4:15:39 PM   CARE MANAGEMENT NOTE 02/11/2012  Patient:  [name],[name]   Account Number:  [id number]  Date Initiated:  02/11/2012  Documentation initiated by:  Lanier Clam  Subjective/Objective Assessment:   ADMITTED W/R RENAL STONES.     Action/Plan:   FROM HOME W/SPOUSE.   Anticipated DC Date:  02/12/2012   Anticipated DC Plan:  HOME/SELF CARE      DC Planning Services  CM consult      Choice offered to / List presented to:             Status of service:  In process, will continue to follow Medicare Important Message given?   (If response is "NO", the following Medicare IM given date fields will be blank) Date Medicare IM given:   Date Additional Medicare IM given:    Discharge Disposition:    Per UR Regulation:  Reviewed for med. necessity/level of care/duration of stay  If discussed at Long Length of Stay Meetings, dates discussed:    Comments:  02/11/12 Shayden Bobier RN,BSN NCM [phone] S/P R PERC NEPHROSTOLITHOTOMY.

## 2012-02-11 NOTE — Anesthesia Postprocedure Evaluation (Signed)
  Anesthesia Post-op Note  Patient: [name]  Procedure(s) Performed: Procedure(s) (LRB): NEPHROLITHOTOMY PERCUTANEOUS (Right)  Patient Location: PACU  Anesthesia Type: General  Level of Consciousness: awake and alert   Airway and Oxygen Therapy: Patient Spontanous Breathing  Post-op Pain: mild  Post-op Assessment: Post-op Vital signs reviewed, Patient's Cardiovascular Status Stable, Respiratory Function Stable, Patent Airway and No signs of Nausea or vomiting  Post-op Vital Signs: stable  Complications: No apparent anesthesia complications

## 2012-02-11 NOTE — H&P (Signed)
[name] is an 37 y.o. male.   Chief Complaint: Pre-OP Rt Percutaneous Nephrostolithotomy HPI:  1 - Rt Renal / Ureteral Stones - Pt with impacted Rt proximal ureteral + rt lower pole stones found on w/u for flank pain 01/2012. Previous attempt at ureteroscopic management 01/13/2012 unsuccessful due to stone impaction and ureteral tortuosity. Neph tube subsequently placed for planner percutaneous approach.  NO interval fevers or neph tube problems.  No CV disease. No strong blood thinners.   Past Medical History  Diagnosis Date  . Sarcoidosis   . Hypertension   . Asthma     last asthma attack last year in 2012    Past Surgical History  Procedure Date  . None   . Esophageal dilation   . Cystoscopy/retrograde/ureteroscopy 01/12/2012    Procedure: CYSTOSCOPY/RETROGRADE/URETEROSCOPY;  Surgeon: Sebastian Ache, MD;  Location: [facility];  Service: Urology;  Laterality: Right;    Family History  Problem Relation Age of Onset  . Hypertension Mother   . Asthma Mother   . Diabetes Father   . Heart attack Father   . Cancer Paternal Grandmother    Social History:  reports that he quit smoking about 4 years ago. His smoking use included Cigarettes. He has a .4 pack-year smoking history. He has never used smokeless tobacco. He reports that he does not drink alcohol or use illicit drugs.  Allergies: No Known Allergies  No prescriptions prior to admission    No results found for this or any previous visit (from the past 48 hour(s)). No results found.  Review of Systems  Constitutional: Negative.  Negative for fever and chills.  HENT: Negative.   Eyes: Negative.   Respiratory: Negative.   Cardiovascular: Negative.   Gastrointestinal: Negative.   Genitourinary: Negative.   Musculoskeletal: Negative.   Skin: Negative.   Neurological: Negative.   Endo/Heme/Allergies: Negative.   Psychiatric/Behavioral: Negative.     Height 5\' 11"  (1.803 m), weight 72.122 kg (159 lb). Physical  Exam  Constitutional: He is oriented to person, place, and time. He appears well-developed and well-nourished.       Thin, very young appearing  HENT:  Head: Atraumatic.  Eyes: EOM are normal. Pupils are equal, round, and reactive to light.  Neck: Normal range of motion. Neck supple.  Cardiovascular: Normal rate and regular rhythm.   Respiratory: Effort normal and breath sounds normal.  GI: Soft. Bowel sounds are normal.  Genitourinary: Penis normal.       Rt neph tube c/d/i with yellow urine  Musculoskeletal: Normal range of motion.  Neurological: He is alert and oriented to person, place, and time.  Skin: Skin is warm and dry.  Psychiatric: He has a normal mood and affect. His behavior is normal. Judgment and thought content normal.     Assessment/Plan 1 - Rt Renal / Ureteral Stones - Proceed with Rt percutaneous nephrostolithotomy today. Risks including bleeding, infection, damage/loss of kidney, need for staged procedures as well as rare risks such as DVT, PE, MI, CVA, mortality previously discussed and reiterated today.   Anhad Sheeley 02/11/2012, 6:21 AM

## 2012-02-11 NOTE — Anesthesia Preprocedure Evaluation (Signed)
Anesthesia Evaluation  Patient identified by MRN, date of birth, ID band Patient awake    Reviewed: Allergy & Precautions, H&P , NPO status , Patient's Chart, lab work & pertinent test results  Airway Mallampati: II TM Distance: <3 FB Neck ROM: Full    Dental No notable dental hx.    Pulmonary asthma ,  breath sounds clear to auscultation  Pulmonary exam normal       Cardiovascular hypertension, Rhythm:Regular Rate:Normal     Neuro/Psych negative neurological ROS  negative psych ROS   GI/Hepatic negative GI ROS, Neg liver ROS,   Endo/Other  negative endocrine ROS  Renal/GU negative Renal ROS  negative genitourinary   Musculoskeletal negative musculoskeletal ROS (+)   Abdominal   Peds negative pediatric ROS (+)  Hematology negative hematology ROS (+)   Anesthesia Other Findings   Reproductive/Obstetrics negative OB ROS                           Anesthesia Physical Anesthesia Plan  ASA: II  Anesthesia Plan: General   Post-op Pain Management:    Induction: Intravenous  Airway Management Planned: Oral ETT  Additional Equipment:   Intra-op Plan:   Post-operative Plan: Extubation in OR  Informed Consent: I have reviewed the patients History and Physical, chart, labs and discussed the procedure including the risks, benefits and alternatives for the proposed anesthesia with the patient or authorized representative who has indicated his/her understanding and acceptance.   Dental advisory given  Plan Discussed with: CRNA and Surgeon  Anesthesia Plan Comments:         Anesthesia Quick Evaluation

## 2012-02-11 NOTE — Transfer of Care (Signed)
Immediate Anesthesia Transfer of Care Note  Patient: [name]  Procedure(s) Performed: Procedure(s) (LRB) with comments: NEPHROLITHOTOMY PERCUTANEOUS (Right)  Patient Location: PACU  Anesthesia Type:General  Level of Consciousness: awake, sedated and patient cooperative  Airway & Oxygen Therapy: Patient Spontanous Breathing and Patient connected to face mask oxygen  Post-op Assessment: Report given to PACU RN, Post -op Vital signs reviewed and stable and Patient moving all extremities X 4  Post vital signs: Reviewed and stable  Complications: No apparent anesthesia complications

## 2012-02-12 LAB — BASIC METABOLIC PANEL
Calcium: 9.5 mg/dL (ref 8.4–10.5)
Creatinine, Ser: 1.64 mg/dL — ABNORMAL HIGH (ref 0.50–1.35)
GFR calc Af Amer: 60 mL/min — ABNORMAL LOW (ref >90)
GFR calc non Af Amer: 52 mL/min — ABNORMAL LOW (ref >90)
Sodium: 131 mEq/L — ABNORMAL LOW (ref 135–145)

## 2012-02-12 LAB — HEMOGLOBIN AND HEMATOCRIT, BLOOD
HCT: 36.3 % — ABNORMAL LOW (ref 39.0–52.0)
Hemoglobin: 12.6 g/dL — ABNORMAL LOW (ref 13.0–17.0)

## 2012-02-12 NOTE — Progress Notes (Signed)
Right flank dressing  completely soaked through and leaking.  Linens and gown were saturated.  Changed dressing and bed linens.  Patient also complaining of the urge to urinate throughout the night. Foley cath size 14 french and has to be manipulated to drain.  Will continue to monitor dressing site and plan to d/c foley cath at 4 am per orders.  Will continue to monitor patient.  [name], [name]

## 2012-02-12 NOTE — Progress Notes (Signed)
Right flank dressing saturated.  New dressing applied. Will continue to monitor patient. Manson Passey, Rayonna Heldman Cherie

## 2012-02-12 NOTE — Op Note (Signed)
NAMETAMAJ, DAMBROSI NO.:  [id number]  MEDICAL RECORD NO.:  [id number]  LOCATION:  1414                         FACILITY:  [facility]  PHYSICIAN:  Sebastian Ache, MD     DATE OF BIRTH:  1975/06/10  DATE OF PROCEDURE: DATE OF DISCHARGE:                              OPERATIVE REPORT   DIAGNOSIS:  Right proximal ureteral stone.  PROCEDURES: 1. Right percutaneous nephrostolithotomy with laser lithotripsy. 2. Right antegrade ureteroscopy. 3. Right antegrade nephrostogram. 4. Exchange of right nephrostomy tube.  FINDINGS: 1. Severely impacted right proximal ureteral stone.  This was very     soft and easily fragmented. 2. Otherwise unremarkable right antegrade nephrostogram, unremarkable     ureter.  SPECIMEN:  Right proximal ureteral stone for composition analysis.  ESTIMATED BLOOD LOSS:  50 mL.  DRAINS: 1. Right nephroureteral stent.  This was capped. 2. Foley catheter straight drain.  INDICATION:  [name] is a very pleasant 36 year old gentleman with history of right flank pain on workup for which he had right ureteral stone found.  He underwent attempted right ureteroscopy last month, however, due to significant impaction of the stone and tortuosity of the ureter, this was unable to be addressed safely in a retrograde fashion. The patient has significant hydro in pain, therefore I felt that a percutaneous approach was warranted as a temporizing measure.  He had a right-sided nephrostomy tube placed which is in satisfactory position in the interval without problems.  The patient now presents for right percutaneous nephrostolithotomy for treatment of his proximal ureteral stone.  Informed consent was obtained and placed in medical record.  PROCEDURE IN DETAIL:  The patient being [name], was verified. Procedure being right percutaneous nephrostolithotomy was confirmed. Procedure was carried out.  Time-out was performed.  Intravenous antibiotics  were administered.  General endotracheal anesthesia was introduced.  Foley catheter was placed per urethra, straight drain.  He was then placed in a prone position employing prone view, padding of his knee, sequential pressure devices, and chest rolls.  Sterile field was created by prepping and draping the patient's right flank and lower chest and upper buttocks using chlorhexidine gluconate.  Antegrade nephrostogram was then obtained.  Right antegrade nephrostogram demonstrated a single system right kidney. There was a filling defect in the proximal ureter and no contrast flow distal to this consistent with known impacted stone.  A 0.038 Glidewire was advanced and coiled in the renal pelvis.  Nephrostomy tube was exchanged for an angle tipped catheter, several attempts at guiding the wire and pass the stone were performed, however, it was unsuccessful. As such, it was exchanged for a super stiff wire and gently coiled in renal pelvis.  The dual-lumen access catheter was then advanced to the renal pelvis and a second Glidewire was coiled in the renal pelvis as a safety wire.  Next, a 30-French balloon dilation apparatus was then placed at the distal end within the renal pelvis.  This was inflated to pressure of 25 atmospheres, held for 90 seconds and a 30-French sheath was very carefully placed until it appeared to be in the mid renal pelvis.  Rigid nephroscopy was then performed.  This revealed excellent placement  of the sheath.  No obvious renal pelvis perforation. Flexible endoscopy of the kidney was performed using 16-French flexible scope and all calices were inspected and no stones were found.  The area of the UPJ was identified in the proximal ureter.  There was indeed a very severely impacted stone present.  Using direct vision, a sensor tip wire was able to be navigated and safely pass this down the ureter to the urinary bladder and was set aside as a new safety wire.   Holmium laser energy was then applied to the stone using settings of 0.6 joules and 6 Hz and it was fragmented approximately 50% of its volume using a dusting technique.  The remainder of 50% was then grasped using an escape-type basket brought out sequentially in fragments and set aside for compositional analysis.  Repeat endoscopic examination of entire kidney revealed no residual fragments.  A separate 0.038 Glidewire was advanced down the ureter to the level of the bladder and the cystoscope was exchanged for a 6-French flexible ureteroscope.  Flexible ureteroscopy was performed in the entire length of the right ureter.  No additional calcifications or mucosal abnormalities were found.  It was felt that all stones were removed and we received the goals of procedure today.  As such a new angle tip catheter was advanced over the remaining safety wire to the level of urinary bladder as a nephroureteral stent. The sheath was removed.  The nephroureteral stent was fashioned and placed at level of skin using 2-0 silk x2 and capped.  Dressing was applied.  Procedure was then terminated.  The patient tolerated the procedure well and there were no immediate periprocedural complications. The patient was taken to the postanesthesia care unit in excellent condition.          ______________________________ Sebastian Ache, MD     TM/MEDQ  D:  02/11/2012  T:  02/12/2012  Job:  [id number]

## 2012-02-12 NOTE — Progress Notes (Signed)
Right flank dressing saturated.  New dressing applied. Will continue to monitor patient. Kanae Ignatowski Cherie   

## 2012-02-14 ENCOUNTER — Encounter (HOSPITAL_COMMUNITY): Payer: Self-pay | Admitting: Urology

## 2012-03-19 NOTE — Discharge Summary (Signed)
Physician Discharge Summary  Patient ID: [name] MRN: [id number] DOB/AGE: 11-Jul-1975 36 y.o.  Admit date: 02/11/2012 Discharge date: 02/12/2012  Admission Diagnoses: Rt Proximal Ureteral Stone  Discharge Diagnoses: Rt Proximal Ureteral Stone   Discharged Condition: good  Hospital Course:   Pt underwent Rt percutaneous nephrostolithotomy for impacted rt proximal ureteral stone 02/11/2012. H was admitted overnight for observation. By 11/9, the day of discharge, his pain was controlled, he was ambulatory, he was tolerating regular diet, he was able to void to completion, and he was felt adequate for discharge.   Consults: None  Significant Diagnostic Studies: labs: Hgb stable  Treatments: surgery: Rt percutaneous nephrostolithotomy for impacted rt proximal ureteral stone 02/11/2012  Discharge Exam: Blood pressure 127/75, pulse 70, temperature 97.8 F (36.6 C), temperature source Oral, resp. rate 20, height 5\' 11"  (1.803 m), weight 72.122 kg (159 lb), SpO2 100.00%. General appearance: alert, cooperative and appears stated age Head: Normocephalic, without obvious abnormality, atraumatic Eyes: conjunctivae/corneas clear. PERRL, EOM's intact. Fundi benign. Ears: normal TM's and external ear canals both ears Nose: Nares normal. Septum midline. Mucosa normal. No drainage or sinus tenderness. Throat: lips, mucosa, and tongue normal; teeth and gums normal Neck: no adenopathy, no carotid bruit, no JVD, supple, symmetrical, trachea midline and thyroid not enlarged, symmetric, no tenderness/mass/nodules Back: symmetric, no curvature. ROM normal. No CVA tenderness., Rt nerphoureteral stent in place. Mild drainage around onto dressing as expected Chest wall: no tenderness Cardio: regular rate and rhythm, S1, S2 normal, no murmur, click, rub or gallop GI: soft, non-tender; bowel sounds normal; no masses,  no organomegaly Male genitalia: normal Extremities: extremities normal, atraumatic, no  cyanosis or edema Pulses: 2+ and symmetric Skin: Skin color, texture, turgor normal. No rashes or lesions Lymph nodes: Cervical, supraclavicular, and axillary nodes normal. Neurologic: Grossly normal  Disposition: 01-Home or Self Care     Medication List     As of 03/19/2012  9:48 AM    STOP taking these medications         sulfamethoxazole-trimethoprim 800-160 MG per tablet   Commonly known as: BACTRIM DS      TAKE these medications         oxyCODONE-acetaminophen 5-325 MG per tablet   Commonly known as: PERCOCET/ROXICET   Take 1 tablet by mouth every 4 (four) hours as needed. Pain      sennosides-docusate sodium 8.6-50 MG tablet   Commonly known as: SENOKOT-S   Take 1 tablet by mouth 2 (two) times daily. While taking pain meds to prevent constipation      sulfamethoxazole-trimethoprim 400-80 MG per tablet   Commonly known as: BACTRIM,SEPTRA   Take 1 tablet by mouth 2 (two) times daily. Take for 3 days begining day before next Urology appointment.           Follow-up Information    Follow up with Sebastian Ache, MD. On 02/24/2102. (at 2pm for MD visit and tube removal)    Contact information:   [address] [place] [address] [phone]          Signed: Sebastian Ache 03/19/2012, 9:48 AM

## 2012-05-20 ENCOUNTER — Other Ambulatory Visit: Payer: Self-pay

## 2013-01-10 ENCOUNTER — Ambulatory Visit (INDEPENDENT_AMBULATORY_CARE_PROVIDER_SITE_OTHER): Payer: 59 | Admitting: Medical

## 2013-01-10 ENCOUNTER — Encounter: Payer: Self-pay | Admitting: Medical

## 2013-01-10 VITALS — BP 122/90 | HR 78 | Temp 98.1°F | Resp 16 | Wt 172.0 lb

## 2013-01-10 DIAGNOSIS — R059 Cough, unspecified: Secondary | ICD-10-CM

## 2013-01-10 DIAGNOSIS — R0602 Shortness of breath: Secondary | ICD-10-CM

## 2013-01-10 DIAGNOSIS — Z862 Personal history of diseases of the blood and blood-forming organs and certain disorders involving the immune mechanism: Secondary | ICD-10-CM

## 2013-01-10 DIAGNOSIS — R05 Cough: Secondary | ICD-10-CM

## 2013-01-10 DIAGNOSIS — J45909 Unspecified asthma, uncomplicated: Secondary | ICD-10-CM

## 2013-01-10 LAB — CBC WITH DIFFERENTIAL/PLATELET
Basophils Absolute: 0 10*3/uL (ref 0.0–0.1)
Eosinophils Absolute: 0.1 10*3/uL (ref 0.0–0.7)
Eosinophils Relative: 3 % (ref 0–5)
Hemoglobin: 14.2 g/dL (ref 13.0–17.0)
Lymphocytes Relative: 30 % (ref 12–46)
Lymphs Abs: 1.1 10*3/uL (ref 0.7–4.0)
MCH: 29.6 pg (ref 26.0–34.0)
MCV: 82.5 fL (ref 78.0–100.0)
Neutro Abs: 2.2 10*3/uL (ref 1.7–7.7)
Neutrophils Relative %: 57 % (ref 43–77)
Platelets: 285 10*3/uL (ref 150–400)
RBC: 4.79 MIL/uL (ref 4.22–5.81)
RDW: 13.9 % (ref 11.5–15.5)
WBC: 3.8 10*3/uL — ABNORMAL LOW (ref 4.0–10.5)

## 2013-01-10 LAB — POCT URINALYSIS DIPSTICK
Bilirubin, UA: NEGATIVE
Glucose, UA: NEGATIVE
Nitrite, UA: NEGATIVE
Spec Grav, UA: 1.01
pH, UA: 5

## 2013-01-10 MED ORDER — MOMETASONE FUROATE 220 MCG/INH IN AEPB: 1.0000 | INHALATION_SPRAY | Freq: Every day | RESPIRATORY_TRACT | Status: DC

## 2013-01-10 MED ORDER — ALBUTEROL SULFATE HFA 108 (90 BASE) MCG/ACT IN AERS: 2.0000 | INHALATION_SPRAY | Freq: Four times a day (QID) | RESPIRATORY_TRACT | Status: DC | PRN

## 2013-01-10 NOTE — Progress Notes (Signed)
Subjective:    [name] is a 37 y.o. male who presents for evaluation of shortness of breath and cough.  He is accompanied by his significant other today. He has an underlying history of sarcoidosis and asthma. Last saw Dr. Maple Hudson with pulmonology January 2013. He has been in his usual state of health until last night started having coughing spells.   After he started coughing for a period of time, then he started getting short of breath and pain with breathing in. Coughing up some color sputum, but no blood. He felt wheezy. Coughing and shortness of breath has continued this morning. He notes some mild sore throat, some right ear popping, some sneezing, no itchy watery eyes, no sinus pressure, no rash.  Episodes usually occur intermittently and have been gradually worsening. Associated symptoms include: chest pain pleuritic and palpitations. The symptoms are aggravated by climbing stairs, and relieved by nothing.  Patient's cardiac risk factors are: hypertension and male gender. Patient's risk factors for DVT/PE: none. Previous cardiac testing: electrocardiogram (ECG).  The following portions of the patient's history were reviewed and updated as appropriate: allergies, current medications, past family history, past medical history, past social history, past surgical history and problem list.  Review of Systems Constitutional: denies fever, chills, sweats, unexpected weight change, anorexia Gastroenterology: denies abdominal pain, nausea, vomiting, diarrhea, constipation,  Hematology: denies bleeding or bruising problems Musculoskeletal: denies arthralgias, myalgias, joint swelling, back pain, neck pain, cramping, gait changes Ophthalmology: denies vision changes Urology: denies dysuria, difficulty urinating, hematuria, urinary frequency, urgency, incontinence Neurology: no headache, weakness, tingling, numbness, speech abnormality, memory loss, falls, dizziness Psychology: denies depressed  mood, agitation, sleep problems   Past Medical History  Diagnosis Date  . Sarcoidosis   . Hypertension   . Asthma     last asthma attack last year in 2012  . Hearing impairment     wears hearing aids  . Wears glasses   . Allergy   . Renal stone     Objective:   Filed Vitals:   01/10/13 1521  BP: 122/90  Pulse: 78  Temp: 98.1 F (36.7 C)  Resp: 16    General appearance: alert, no distress, WD/WN, lean AA male  HEENT: normocephalic, conjunctiva/corneas normal, sclerae anicteric, PERRLA, EOMi, nares patent, no discharge or erythema, pharynx with post nasal drainage Oral cavity: MMM, tongue normal, teeth normal Neck: supple, no lymphadenopathy, no thyromegaly, no JVD, no bruits, no masses Chest: non tender, normal shape and expansion Heart: RRR, normal S1, S2, no murmurs Lungs: CTA bilaterally, no wheezes, rhonchi, or rales Abdomen: +bs, soft, non tender, non distended, no masses, no hepatomegaly, no splenomegaly, no bruits Extremities: no edema, no cyanosis, no clubbing Pulses: 2+ symmetric, upper and lower extremities, normal cap refill     Adult ECG Report  Indication: SOB  Rate: 65bpm  Rhythm: normal sinus rhythm  QRS Axis: 49 degrees  PR Interval:  QRS Duration:  QTc:  Conduction Disturbances: none  Other Abnormalities: none  Patient's cardiac risk factors are: hypertension and male gender.  EKG comparison: 04/2011 unchanged   Narrative Interpretation: normal EKG   Assessment:   Encounter Diagnoses  Name Primary?  . Shortness of breath Yes  . Cough   . History of sarcoidosis   . Unspecified asthma(493.90)     Plan   Discussed his past medical history, discussed causes of shortness of breath and cough. I suspect his symptoms are related to allergic asthma. Begin Asmanex controller inhaler, demonstrated proper  use, and I had him do his first inhalation today. Rinse mouth out with water after each use. Use albuterol rescue inhaler when  necessary.  Given his underlying history, we will send for chest x-ray, labs, urine. Of note looking back in pulmonology notes from January 2013 the sarcoidosis was flaring up.  He never went back for followup.   In the past year he has been managed for kidney stone, elevated creatinine.  Advised he go to the hospital or be seen immediately for fever or, or shortness of breath, hemoptysis, dark urine, or just not improving.

## 2013-01-10 NOTE — Patient Instructions (Signed)
I suspect your symptoms today are related to allergic asthma flare up.    But given your history of sarcoidosis, we will check labs and chest xray today.   For your symptoms,  Begin Asmanex CONTROLLER inhaler, 1 inhalation daily.   Rinse out mouth with water after each use.  For cough fits, shortness of breath, wheezing, use the Albuterol inhaler, 2 puffs every 4-6 hours as needed.   I sent this to pharmacy.    Begin Zyrtec or Allegra allergy pill at bedtime.

## 2013-01-11 LAB — COMPREHENSIVE METABOLIC PANEL
Albumin: 4.2 g/dL (ref 3.5–5.2)
CO2: 27 mEq/L (ref 19–32)
Glucose, Bld: 93 mg/dL (ref 70–99)
Potassium: 4.5 mEq/L (ref 3.5–5.3)
Sodium: 133 mEq/L — ABNORMAL LOW (ref 135–145)
Total Bilirubin: 0.7 mg/dL (ref 0.3–1.2)
Total Protein: 7.9 g/dL (ref 6.0–8.3)

## 2013-01-17 ENCOUNTER — Ambulatory Visit
Admission: RE | Admit: 2013-01-17 | Discharge: 2013-01-17 | Disposition: A | Discharge status: Home / Self Care | Payer: 59 | Source: Ambulatory Visit | Attending: Medical | Admitting: Medical

## 2013-01-17 DIAGNOSIS — Z862 Personal history of diseases of the blood and blood-forming organs and certain disorders involving the immune mechanism: Secondary | ICD-10-CM

## 2013-01-17 DIAGNOSIS — R05 Cough: Secondary | ICD-10-CM

## 2013-01-17 DIAGNOSIS — J45909 Unspecified asthma, uncomplicated: Secondary | ICD-10-CM

## 2013-01-17 DIAGNOSIS — R059 Cough, unspecified: Secondary | ICD-10-CM

## 2013-01-17 DIAGNOSIS — R0602 Shortness of breath: Secondary | ICD-10-CM

## 2013-01-22 ENCOUNTER — Ambulatory Visit (INDEPENDENT_AMBULATORY_CARE_PROVIDER_SITE_OTHER): Payer: 59 | Admitting: Medical

## 2013-01-22 ENCOUNTER — Encounter: Payer: Self-pay | Admitting: Medical

## 2013-01-22 VITALS — BP 110/80 | HR 78 | Temp 98.5°F | Resp 16 | Wt 170.0 lb

## 2013-01-22 DIAGNOSIS — M791 Myalgia, unspecified site: Secondary | ICD-10-CM

## 2013-01-22 DIAGNOSIS — R300 Dysuria: Secondary | ICD-10-CM

## 2013-01-22 DIAGNOSIS — Z113 Encounter for screening for infections with a predominantly sexual mode of transmission: Secondary | ICD-10-CM

## 2013-01-22 DIAGNOSIS — R252 Cramp and spasm: Secondary | ICD-10-CM

## 2013-01-22 DIAGNOSIS — IMO0001 Reserved for inherently not codable concepts without codable children: Secondary | ICD-10-CM

## 2013-01-22 DIAGNOSIS — R0602 Shortness of breath: Secondary | ICD-10-CM

## 2013-01-22 DIAGNOSIS — Z862 Personal history of diseases of the blood and blood-forming organs and certain disorders involving the immune mechanism: Secondary | ICD-10-CM

## 2013-01-22 LAB — POCT URINALYSIS DIPSTICK
Bilirubin, UA: NEGATIVE
Glucose, UA: NEGATIVE
Spec Grav, UA: 1.015
Urobilinogen, UA: NEGATIVE
pH, UA: 5

## 2013-01-22 NOTE — Progress Notes (Signed)
Subjective:    [name] is a 37 y.o. male who presents for recheck.  He is accompanied by his wife today. I saw him 01/10/13 as a new patient for shortness of breath and cough.   Since last visit he went for CXR, began Asmanex and c/t albuterol inhaler.  Although he reports some improvement with his breathing, it hasn't been a significant improvement.  He is using Asmanex one puff twice daily, but has not had to use his albuterol inhaler since last visit.  He is not taking any allergy medication.  He still reports cough and some shortness of breath.   He has a new complaint of burning with urination times a month. He has seen frank blood in his urine. He denies discharge, testicular pain, rash, pelvic pain, back pain, abdominal pain. No prior history of STD or UTI.  He also complains of cramps and tightening of muscles, particularly hands, left leg, left arm.  He has an underlying history of sarcoidosis and asthma. Last saw Dr. Maple Hudson with pulmonology January 2013. He has been in his usual state of health until he started having coughing spells a few weeks ago.   After he started coughing for a period of time, then he started getting short of breath and pain with breathing in. Coughing up some color sputum, but no blood. He has felt wheezy. He has had some mild sore throat, some right ear popping, some sneezing, no itchy watery eyes, no sinus pressure, no rash.  Episodes usually occur intermittently and have been gradually worsening. Associated symptoms include: chest pain pleuritic and palpitations. The symptoms are aggravated by climbing stairs, and relieved by nothing.  Patient's cardiac risk factors are: hypertension and male gender. Patient's risk factors for DVT/PE: none. Previous cardiac testing: electrocardiogram (ECG).  The following portions of the patient's history were reviewed and updated as appropriate: allergies, current medications, past family history, past medical history, past  social history, past surgical history and problem list.  Review of Systems As in subjective   Past Medical History  Diagnosis Date  . Sarcoidosis   . Hypertension   . Asthma     last asthma attack last year in 2012  . Hearing impairment     wears hearing aids  . Wears glasses   . Allergy   . Renal stone     Objective:   Filed Vitals:   01/22/13 1119  BP: 110/80  Pulse: 78  Temp: 98.5 F (36.9 C)  Resp: 16    General appearance: alert, no distress, WD/WN, lean AA male  HEENT: normocephalic, conjunctiva/corneas normal, sclerae anicteric, PERRLA, EOMi, nares patent, no discharge or erythema, pharynx with post nasal drainage Oral cavity: MMM, tongue normal, teeth normal Neck: supple, no lymphadenopathy, no thyromegaly, no JVD, no bruits, no masses Chest: non tender, normal shape and expansion Heart: RRR, normal S1, S2, no murmurs Lungs: CTA bilaterally, no wheezes, rhonchi, or rales Abdomen: +bs, soft, non tender, non distended, no masses, no hepatomegaly, no splenomegaly, no bruits Extremities: no edema, no cyanosis, no clubbing Pulses: 2+ symmetric, upper and lower extremities, normal cap refill  GU: Normal external male genitalia, circumcised, no lymphadenopathy, no mass, no rash, no hernia, penile urethral swab taken   Assessment:   Encounter Diagnoses  Name Primary?  . Shortness of breath Yes  . History of sarcoidosis   . Burning with urination   . Myalgia   . Muscle cramp   . Screen for STD (sexually transmitted disease)  Plan   Shortness of breath and sarcoidosis history-the chest x-ray from last visit was stable, no new findings, EKG was not worrisome, reviewed his labs as well.  I advised he continue Asmanex for now twice daily, albuterol as needed, consider adding Zyrtec at nighttime over-the-counter. Followup with pulmonology Friday.  Burning with urination - urine dipstick, culture, and STD screening today  Myalgia, cramps - few additional labs  today.  He is not drinking much water at all . Advised he increase water intake significantly.   Of note looking back in pulmonology notes from January 2013 the sarcoidosis was flaring up.  He never went back for followup.   In the past year he has been managed for kidney stone, elevated creatinine.

## 2013-01-23 LAB — T4, FREE: Free T4: 1.03 ng/dL (ref 0.80–1.80)

## 2013-01-23 LAB — GC/CHLAMYDIA PROBE AMP
CT Probe RNA: NEGATIVE
GC Probe RNA: NEGATIVE

## 2013-01-23 LAB — URINE CULTURE: Colony Count: 50000

## 2013-01-23 LAB — HIV ANTIBODY (ROUTINE TESTING W REFLEX): HIV: NONREACTIVE

## 2013-01-24 ENCOUNTER — Other Ambulatory Visit: Payer: Self-pay

## 2013-01-24 ENCOUNTER — Other Ambulatory Visit: Payer: Self-pay | Admitting: Medical

## 2013-01-24 MED ORDER — CIPROFLOXACIN HCL 500 MG PO TABS: 500.0000 mg | ORAL_TABLET | Freq: Two times a day (BID) | ORAL | Status: DC

## 2013-01-26 ENCOUNTER — Encounter: Payer: Self-pay | Admitting: *Deleted

## 2013-01-26 ENCOUNTER — Ambulatory Visit (INDEPENDENT_AMBULATORY_CARE_PROVIDER_SITE_OTHER): Payer: 59 | Admitting: Internal Medicine

## 2013-01-26 ENCOUNTER — Encounter: Payer: Self-pay | Admitting: Internal Medicine

## 2013-01-26 ENCOUNTER — Other Ambulatory Visit (INDEPENDENT_AMBULATORY_CARE_PROVIDER_SITE_OTHER): Payer: 59

## 2013-01-26 VITALS — BP 120/78 | HR 74 | Ht 71.0 in | Wt 170.8 lb

## 2013-01-26 DIAGNOSIS — D869 Sarcoidosis, unspecified: Secondary | ICD-10-CM

## 2013-01-26 LAB — COMPREHENSIVE METABOLIC PANEL
AST: 29 U/L (ref 0–37)
Alkaline Phosphatase: 84 U/L (ref 39–117)
BUN: 16 mg/dL (ref 6–23)
Creatinine, Ser: 1.2 mg/dL (ref 0.4–1.5)
Glucose, Bld: 84 mg/dL (ref 70–99)
Sodium: 136 mEq/L (ref 135–145)
Total Bilirubin: 0.8 mg/dL (ref 0.3–1.2)
Total Protein: 8.6 g/dL — ABNORMAL HIGH (ref 6.0–8.3)

## 2013-01-26 LAB — ANGIOTENSIN CONVERTING ENZYME: Angiotensin-Converting Enzyme: 89 U/L — ABNORMAL HIGH (ref 8–52)

## 2013-01-26 NOTE — Progress Notes (Signed)
Patient ID: [name], male    DOB: 1976/01/25, 37 y.o.   MRN: [id number]  HPI 10/19/10- 21 yoM former smoker, seen at request of Lonie Peak, [facility], in [place], because of sarcoid. Here with fiancee. Presented with chest pain, mediastinal adenopathy 2009. Bronchoscopy/ TBB x by Dr Orson Aloe + noncaseating granuomas. CT chest 2009- bilateral hilar adenopathy and diffuse peribronchovascular and parenchymal nodules c/w Stage II Sarcoid. PFT then restricted- FVC 0.56%; FEV1/FVC 0.90; DLCO 75%. Eye exam neg for sarcoid changes.  He was given an inhaler to try, with little effect, and scheduled back to discuss treatment. Admitting now he was scared, he accepted opportunity to go to [place] to work and was never treated.  Now back in this area. For last 1-2 years he has been more aware of exertional dyspnea, relieved by rest. Cough with thick white. Occasional sternal pain with cough. Occasional night sweat.and left flank pain.  Denies nodes, rash, hot joint pain. Was losing some weight until his PCP put him on prednisone. Now off x 3 weeks, it caused fluid retention, but relieved malaise. Tried Flovent and now Symbicort.  No hx TB exposure. 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  11/16/10- 34 yoM former smoker, seen at request of Lonie Peak, [facility], in [place], because of sarcoid. Fiance here Has remained off prednisone during lab evaluation. Without it appetite is poor and he is losing weight. Notes cough if he laughs or if he exerts and gets short of breath. May cough some yellow, no blood. Feels hot at times. No heavy night sweats. Denies rash or nodes. Early satiety. Denies nocturia, palpitation, nervousness.  Labs 10/29/10- PPD- negative by PCP-documented ACE-50 (8-52) CBC okay, BMET okay (random glucose 110), LFT okay  CXR- 10/19/2010 stable bilateral upper zone scarring without change compared with September 2010  12/25/10- 34 yoM former smoker, seen at  request of Lonie Peak, [facility], in [place], because of sarcoid. Fiancee here. At last visit we had him take prednisone 5 mg daily , then every other day, and stop Symbicort.  Since then he has felt some better, with no new problems. Weight is up 4-5 lbs and appetite is better. Less short of breath, still some dry cough but no more tussive chest soreness. No longer having heavy night sweats. Notes some soreness in right flank which may be muscular, w/o dysuria. Some joint ache- mainly left elbow, and a little muscle twitch. No longer having leg edema seen at initial higher dose steroid introduction.  04/26/11- 34 yoM former smoker because of sarcoid. Fiancee here. No respiratory infection/ colds or flu this winter. Was on prednisone 10 mg qod after last visit, but dropped off at Christmas when he left pills while visiting in [place]. Has been off prednisone x 1 month. He and fiance note increased night sweats and nonproductive cough since off prednisone. Generalized itching w/o rash- skin is very dry. Denies fever, nodes, abdominal pain, dyspnea.  ACE level 10/19/10- 50 CXR 10/19/10- diffuse reticulonodular scarring, chronic.  01/26/13- 37 yoM former followed for sarcoid  PCP Shane Tysinger, PAC FOLLOWS FOR:  SOB increased x2 months w/ & w/o exertion.  Increased muscle tightness to point arm can not move.  Wife here For the past couple of months has felt tightness in the left arm and left leg. Episodes last 10 or 15 minutes with some cramping. He works Holiday representative. This is worse at night and can wake him. Shortness of breath with exertion maybe  a little worse. He continues Asmanex, once daily. Some dry cough or scant clear sputum. Denies fever, sweat, adenopathy or rash CXR 01/10/13 IMPRESSION:  Stable fibrotic change within both upper lobes. No adenopathy or  active process is seen.  Electronically Signed  By: Dwyane Dee M.D.  On: 01/17/2013 13:13 . Review of Systems- HPI Constitutional:    No-  weight loss       No- night sweats, fevers, chills, fatigue, lassitude. HEENT:   No-  headaches, difficulty swallowing, tooth/dental problems, sore throat,       No-  sneezing, itching, ear ache, nasal congestion, post nasal drip,  CV:  No-   chest pain, orthopnea, PND, swelling in lower extremities, anasarca,dizziness, palpitations Resp: Minor-  shortness of breath with exertion or at rest.               No- productive cough,  + non-productive cough,  No-  coughing up of blood.              No-   change in color of mucus.  No- wheezing.   Skin: No-  rash or lesions. + dry, itching GI:  No-   heartburn, indigestion, abdominal pain, nausea, vomiting,  GU:  MS:  No-   joint pain or swelling.  No- decreased range of motion. + tightness L arm/leg Neuro- no change Psych:  No- change in mood or affect. No depression or anxiety.  No memory loss.   Objective:   Physical Exam General- Alert, Oriented, Affect-appropriate, Distress- none acute  Very slender AA male Skin- rash-none, lesions- none, excoriation- none Lymphadenopathy- none Head- atraumatic            Eyes- Gross vision intact, PERRLA, conjunctivae clear secretions            Ears- Hearing, canals normal            Nose- Clear, No-Septal dev, mucus, polyps, erosion, perforation             Throat- Mallampati II , mucosa clear , drainage- none, tonsils- atrophic Neck- flexible , trachea midline, no stridor , thyroid nl, carotid no bruit Chest - symmetrical excursion , unlabored           Heart/CV- RRR , no murmur , no gallop  , no rub, nl s1 s2                           - JVD- none , edema- none, stasis changes- none, varices- none           Lung- +trace crackle, wheeze- none, cough-none, dullness-none, rub- none             Chest wall-  Abd-  HSM- no Br/ Gen/ Rectal- Not done, not indicated Extrem- I can't appreciate tightness now that he describes in the left arm and leg intermittently Neuro- grossly intact to observation

## 2013-01-26 NOTE — Patient Instructions (Signed)
Order lab- CMET metabolic panel, ACE level    Dx sarcoid  Please call as needed  If our lab tests aren't helpful, then you will want to discuss your stiffness with your primary care provider

## 2013-02-05 ENCOUNTER — Telehealth: Payer: Self-pay | Admitting: Internal Medicine

## 2013-02-05 ENCOUNTER — Telehealth: Payer: Self-pay | Admitting: Medical

## 2013-02-05 NOTE — Telephone Encounter (Signed)
Pt's girlfriend called and stated that pt was to call when he was out of samples and he is now out.

## 2013-02-05 NOTE — Telephone Encounter (Signed)
Result Notes    Notes Recorded by Waymon Budge, MD on 02/02/2013 at 4:31 PM Routine chemistry tests are normal, not explaining muscle stiffness. The ACE test for sarcoid was elevated, but without other sign of active sarcoid, i think we should just watch for now.  --  I spoke with patient about results and he verbalized understanding and had no questions

## 2013-02-05 NOTE — Telephone Encounter (Signed)
Samples of what??

## 2013-02-07 ENCOUNTER — Other Ambulatory Visit: Payer: Self-pay | Admitting: Family Medicine

## 2013-02-07 ENCOUNTER — Other Ambulatory Visit: Payer: Self-pay

## 2013-02-07 MED ORDER — MOMETASONE FUROATE 220 MCG/INH IN AEPB: 1.0000 | INHALATION_SPRAY | Freq: Every day | RESPIRATORY_TRACT | Status: DC

## 2013-02-07 NOTE — Telephone Encounter (Signed)
So looked at visit notes and looks like he was given samples of asmanex.

## 2013-02-07 NOTE — Telephone Encounter (Signed)
Patient is aware that the sample is ready. CLS

## 2013-02-07 NOTE — Telephone Encounter (Signed)
I believe this was regarding Asmanex.  In the future, try and pin them down on the name of the medication.    He actually saw pulmonology after I saw him.   So did they discuss Asmanex, other lung medications, did they recommend he continue Asmanex?

## 2013-02-07 NOTE — Telephone Encounter (Signed)
Patient states that the pulmonologist's ask him what was he currently taking and he told and the pulmonologist told him to continue his same medications. CLS

## 2013-02-07 NOTE — Telephone Encounter (Signed)
I have no idea. When I asked her she didn't know. She said the pt told her he was to call you and let you know when he was out that was all the details she had. I will try to call pt and find out. I was assuming that because samples where given by [place] you would know. She stated she was calling because he was at work and could not call.

## 2013-02-07 NOTE — Telephone Encounter (Signed)
If we have more asmanex samples of the dose he is on, then get him samples

## 2013-02-08 ENCOUNTER — Other Ambulatory Visit (INDEPENDENT_AMBULATORY_CARE_PROVIDER_SITE_OTHER): Payer: 59

## 2013-02-08 DIAGNOSIS — R300 Dysuria: Secondary | ICD-10-CM

## 2013-02-08 LAB — POCT URINALYSIS DIPSTICK
Bilirubin, UA: NEGATIVE
Glucose, UA: NEGATIVE
Ketones, UA: NEGATIVE
Nitrite, UA: POSITIVE
Spec Grav, UA: 1.015
pH, UA: 6

## 2013-02-11 NOTE — Assessment & Plan Note (Signed)
Some increasing shortness of breath with exertion but otherwise no obvious symptoms of sarcoid, unless the tightness he describes his left arm and left leg might be an electrolyte/calcium problem Plan- CMET, ACE level

## 2013-02-14 ENCOUNTER — Other Ambulatory Visit: Payer: Self-pay | Admitting: Medical

## 2013-02-14 MED ORDER — CIPROFLOXACIN HCL 500 MG PO TABS: 500.0000 mg | ORAL_TABLET | Freq: Two times a day (BID) | ORAL | Status: DC

## 2014-01-11 ENCOUNTER — Ambulatory Visit
Admission: RE | Admit: 2014-01-11 | Discharge: 2014-01-11 | Disposition: A | Discharge status: Home / Self Care | Payer: 59 | Source: Ambulatory Visit | Attending: Medical | Admitting: Medical

## 2014-01-11 ENCOUNTER — Ambulatory Visit (INDEPENDENT_AMBULATORY_CARE_PROVIDER_SITE_OTHER): Payer: 59 | Admitting: Medical

## 2014-01-11 ENCOUNTER — Encounter: Payer: Self-pay | Admitting: Medical

## 2014-01-11 VITALS — BP 120/80 | HR 88 | Temp 98.1°F | Resp 16 | Wt 164.0 lb

## 2014-01-11 DIAGNOSIS — R059 Cough, unspecified: Secondary | ICD-10-CM

## 2014-01-11 DIAGNOSIS — R05 Cough: Secondary | ICD-10-CM

## 2014-01-11 DIAGNOSIS — R634 Abnormal weight loss: Secondary | ICD-10-CM

## 2014-01-11 DIAGNOSIS — R61 Generalized hyperhidrosis: Secondary | ICD-10-CM

## 2014-01-11 DIAGNOSIS — Z862 Personal history of diseases of the blood and blood-forming organs and certain disorders involving the immune mechanism: Secondary | ICD-10-CM

## 2014-01-11 DIAGNOSIS — R195 Other fecal abnormalities: Secondary | ICD-10-CM

## 2014-01-11 DIAGNOSIS — R8290 Unspecified abnormal findings in urine: Secondary | ICD-10-CM

## 2014-01-11 LAB — CBC WITH DIFFERENTIAL/PLATELET
Basophils Absolute: 0 10*3/uL (ref 0.0–0.1)
Basophils Relative: 0 % (ref 0–1)
EOS PCT: 2 % (ref 0–5)
Eosinophils Absolute: 0.1 10*3/uL (ref 0.0–0.7)
HEMATOCRIT: 40.6 % (ref 39.0–52.0)
HEMOGLOBIN: 14.6 g/dL (ref 13.0–17.0)
LYMPHS ABS: 1.1 10*3/uL (ref 0.7–4.0)
LYMPHS PCT: 26 % (ref 12–46)
MCH: 29.4 pg (ref 26.0–34.0)
MCHC: 36 g/dL (ref 30.0–36.0)
MCV: 81.7 fL (ref 78.0–100.0)
MONOS PCT: 9 % (ref 3–12)
Monocytes Absolute: 0.4 10*3/uL (ref 0.1–1.0)
Neutro Abs: 2.6 10*3/uL (ref 1.7–7.7)
Neutrophils Relative %: 63 % (ref 43–77)
PLATELETS: 248 10*3/uL (ref 150–400)
RBC: 4.97 MIL/uL (ref 4.22–5.81)
RDW: 14.7 % (ref 11.5–15.5)
WBC: 4.2 10*3/uL (ref 4.0–10.5)

## 2014-01-11 LAB — COMPREHENSIVE METABOLIC PANEL
ALK PHOS: 85 U/L (ref 39–117)
ALT: 15 U/L (ref 0–53)
AST: 26 U/L (ref 0–37)
Albumin: 4.2 g/dL (ref 3.5–5.2)
BILIRUBIN TOTAL: 1 mg/dL (ref 0.2–1.2)
BUN: 16 mg/dL (ref 6–23)
CO2: 25 meq/L (ref 19–32)
CREATININE: 1.35 mg/dL (ref 0.50–1.35)
Calcium: 9.7 mg/dL (ref 8.4–10.5)
Chloride: 105 mEq/L (ref 96–112)
Glucose, Bld: 84 mg/dL (ref 70–99)
Potassium: 4.5 mEq/L (ref 3.5–5.3)
Sodium: 137 mEq/L (ref 135–145)
Total Protein: 8.1 g/dL (ref 6.0–8.3)

## 2014-01-11 LAB — POCT URINALYSIS DIPSTICK
Bilirubin, UA: NEGATIVE
GLUCOSE UA: NEGATIVE
Ketones, UA: NEGATIVE
Nitrite, UA: POSITIVE
Spec Grav, UA: 1.02
UROBILINOGEN UA: NEGATIVE
pH, UA: 5

## 2014-01-11 LAB — TSH: TSH: 2.173 u[IU]/mL (ref 0.350–4.500)

## 2014-01-11 MED ORDER — CIPROFLOXACIN HCL 500 MG PO TABS: 500.0000 mg | ORAL_TABLET | Freq: Two times a day (BID) | ORAL | Status: DC

## 2014-01-11 MED ORDER — ALBUTEROL SULFATE HFA 108 (90 BASE) MCG/ACT IN AERS: 2.0000 | INHALATION_SPRAY | Freq: Four times a day (QID) | RESPIRATORY_TRACT | Status: DC | PRN

## 2014-01-11 MED ORDER — MOMETASONE FUROATE 220 MCG/INH IN AEPB: 1.0000 | INHALATION_SPRAY | Freq: Two times a day (BID) | RESPIRATORY_TRACT | Status: DC

## 2014-01-11 NOTE — Progress Notes (Signed)
Subjective:   [name] is a 38 y.o. male presenting on 01/11/2014 with loosing weight and Medication Refill  Here for concern for weight loss x 1 month.  Has decreased appetite.  Can be hungry, but when eating, appetite goes away, seems to have to force himself to eat.  He reports some headaches, shortness of breath, at night sometimes sweating.   Denies fever.  Some aches all over, some aches in side.  Worried the sarcoidosis has flared back up.  This is how prior sarcoidosis flare started.  He has seen pulmonology within last year, hasn't seen eye doctor in 2 year+.  He does use chewing tobacco off and on.   No recent dental visit.  No other aggravating or relieving factors.  No other complaint.  Review of Systems.  Review of Systems Constitutional: -fever, -chills, +sweats,+unexpected weight change, +fatigue, stays cold, but no heat intolerance ENT: +runny nose, +ear pain, -sore throat, +dry throat Cardiology:  -chest pain, -palpitations, -edema Respiratory: +cough, +shortness of breath, +wheezing Gastroenterology: -abdominal pain, -nausea, -vomiting, -diarrhea, -constipation  Hematology: -bleeding or bruising problems Musculoskeletal: -arthralgias, +generalized body aches at times, -joint swelling, -back pain Ophthalmology: -vision changes Urology: -dysuria, -difficulty urinating, -hematuria, -urinary frequency, -urgency Neurology: +headache, -weakness, -tingling, -numbness No skin or nail changes.       Objective:    Filed Vitals:   01/11/14 0809  BP: 120/80  Pulse: 88  Temp: 98.1 F (36.7 C)  Resp: 16   Wt Readings from Last 3 Encounters:  01/11/14 164 lb (74.39 kg)  01/26/13 170 lb 12.8 oz (77.474 kg)  01/22/13 170 lb (77.111 kg)    General appearance: alert, no distress, WD/WN, lean AA male HEENT: normocephalic, sclerae anicteric, TMs pearly, nares patent, no discharge or erythema, pharynx normal Oral cavity: MMM, no lesions, some denatl plaque, no obvious  leukoplakia Neck: supple, no lymphadenopathy, no thyromegaly, no masses Heart: RRR, normal S1, S2, no murmurs Lungs: CTA bilaterally, no wheezes, rhonchi, or rales Abdomen: +bs, soft, non tender, non distended, no masses, no hepatomegaly, no splenomegaly Pulses: 2+ symmetric, upper and lower extremities, normal cap refill Ext: no edema MSK: no obvious tenderness or deformity Neuro: nonfocal exam DRE: prostate mildly enlarged, but no nodules, not boggy, occult + stool GU: normal male  Circumcised, no testicular masses, nontender, no hernia, no lymphadenopathy     Assessment: Encounter Diagnoses  Name Primary?  Marland Kitchen. Unexplained weight loss Yes  . Cough   . History of sarcoidosis   . Night sweats   . Occult blood positive stool   . Abnormal urinalysis      Plan: Urinalysis today showed microscopic blood, protein, leukocytes, nitrates, urine culture sent. We will begin Cipro empirically.  Lab panel today, we'll send for chest x-ray.  Advise he make a followup visit with his dentist and eye doctor for general care as well as helping to rule out sarcoid. Of note he is a chewing tobacco user  Advise he stop chewing tobacco  We will likely need to also move forward with a diagnostic colonoscopy given occult + blood.   [name] was seen today for loosing weight and medication refill.  Diagnoses and associated orders for this visit:  Unexplained weight loss - Comprehensive metabolic panel - CBC with Differential - TSH - POCT occult blood stool - Sedimentation rate - DG Chest 2 View; Future - Urinalysis Dipstick - PSA - Urine culture  Cough - Comprehensive metabolic panel - CBC with Differential - TSH - POCT  occult blood stool - Sedimentation rate - DG Chest 2 View; Future - Urinalysis Dipstick - PSA - Urine culture  History of sarcoidosis - Comprehensive metabolic panel - CBC with Differential - TSH - POCT occult blood stool - Sedimentation rate - DG Chest 2  View; Future - Urinalysis Dipstick - PSA - Urine culture  Night sweats - Comprehensive metabolic panel - CBC with Differential - TSH - POCT occult blood stool - Sedimentation rate - DG Chest 2 View; Future - Urinalysis Dipstick - PSA - Urine culture  Occult blood positive stool - Urine culture  Abnormal urinalysis    Return pending studies.

## 2014-01-12 LAB — PSA: PSA: 1.95 ng/mL (ref <=4.00)

## 2014-01-12 LAB — SEDIMENTATION RATE: Sed Rate: 12 mm/hr (ref 0–16)

## 2014-01-14 LAB — URINE CULTURE: Colony Count: >=100000

## 2014-06-26 ENCOUNTER — Encounter: Payer: Self-pay | Admitting: Medical

## 2014-06-26 ENCOUNTER — Ambulatory Visit (INDEPENDENT_AMBULATORY_CARE_PROVIDER_SITE_OTHER): Payer: 59 | Admitting: Medical

## 2014-06-26 VITALS — BP 120/80 | HR 101 | Temp 98.2°F | Resp 15 | Wt 162.0 lb

## 2014-06-26 DIAGNOSIS — R109 Unspecified abdominal pain: Secondary | ICD-10-CM

## 2014-06-26 DIAGNOSIS — Z862 Personal history of diseases of the blood and blood-forming organs and certain disorders involving the immune mechanism: Secondary | ICD-10-CM

## 2014-06-26 DIAGNOSIS — R312 Other microscopic hematuria: Secondary | ICD-10-CM

## 2014-06-26 DIAGNOSIS — R3129 Other microscopic hematuria: Secondary | ICD-10-CM

## 2014-06-26 DIAGNOSIS — J309 Allergic rhinitis, unspecified: Secondary | ICD-10-CM

## 2014-06-26 DIAGNOSIS — Z87442 Personal history of urinary calculi: Secondary | ICD-10-CM

## 2014-06-26 DIAGNOSIS — R10A1 Flank pain, right side: Secondary | ICD-10-CM

## 2014-06-26 LAB — POCT URINALYSIS DIPSTICK
Bilirubin, UA: NEGATIVE
GLUCOSE UA: NEGATIVE
Ketones, UA: NEGATIVE
Nitrite, UA: NEGATIVE
UROBILINOGEN UA: NEGATIVE
pH, UA: 6

## 2014-06-26 LAB — CBC
HCT: 39.6 % (ref 39.0–52.0)
Hemoglobin: 13.5 g/dL (ref 13.0–17.0)
MCH: 28.2 pg (ref 26.0–34.0)
MCHC: 34.1 g/dL (ref 30.0–36.0)
MCV: 82.7 fL (ref 78.0–100.0)
MPV: 8.1 fL — ABNORMAL LOW (ref 8.6–12.4)
Platelets: 274 10*3/uL (ref 150–400)
RBC: 4.79 MIL/uL (ref 4.22–5.81)
RDW: 14.5 % (ref 11.5–15.5)
WBC: 4.4 10*3/uL (ref 4.0–10.5)

## 2014-06-26 LAB — COMPREHENSIVE METABOLIC PANEL
ALBUMIN: 4.1 g/dL (ref 3.5–5.2)
ALK PHOS: 100 U/L (ref 39–117)
ALT: 14 U/L (ref 0–53)
AST: 22 U/L (ref 0–37)
BUN: 15 mg/dL (ref 6–23)
CHLORIDE: 103 meq/L (ref 96–112)
CO2: 27 meq/L (ref 19–32)
Calcium: 9.6 mg/dL (ref 8.4–10.5)
Creat: 1.18 mg/dL (ref 0.50–1.35)
Glucose, Bld: 93 mg/dL (ref 70–99)
Potassium: 4.8 mEq/L (ref 3.5–5.3)
SODIUM: 138 meq/L (ref 135–145)
Total Bilirubin: 0.8 mg/dL (ref 0.2–1.2)
Total Protein: 7.9 g/dL (ref 6.0–8.3)

## 2014-06-26 MED ORDER — HYDROCODONE-ACETAMINOPHEN 5-325 MG PO TABS: 1.0000 | ORAL_TABLET | Freq: Four times a day (QID) | ORAL | Status: DC | PRN

## 2014-06-26 MED ORDER — CIPROFLOXACIN HCL 500 MG PO TABS: 500.0000 mg | ORAL_TABLET | Freq: Two times a day (BID) | ORAL | Status: DC

## 2014-06-26 NOTE — Progress Notes (Signed)
Subjective: Here for concerns for blood in urine.   Had physical at work, annual screening, had blood in urine.  Today right kidney is hurting. Every now and then has to strain to urinate.  Sometimes gets up during sleep to urinate.   Denies urgency, frequency.   Has had subjective fever and chills for about the last week.   Doesn't see blood in urine.   Urine some times dark yellow.  No urine odor.  No ejaculatory concern.   No penile discharge.  No penile or testicle pain or swelling.   No perineal or rectum pain.  No new sexual partners since last STD testing, no concern for STD today.  Thinks he may have a kidney stone.   Has had some runny nose of recent.  When he blows his nose, gets some bloody mucous twice recently.   Has some dry cough.   Has oil heat at home.   Doesn't normally get nosebleeds this time of year.   Past Medical History  Diagnosis Date  . Sarcoidosis   . Hypertension   . Asthma     last asthma attack last year in 2012  . Hearing impairment     wears hearing aids  . Wears glasses   . Allergy   . Renal stone    Past Surgical History  Procedure Laterality Date  . None    . Esophageal dilation    . Cystoscopy/retrograde/ureteroscopy  01/12/2012    Procedure: CYSTOSCOPY/RETROGRADE/URETEROSCOPY;  Surgeon: Sebastian Acheheodore Manny, MD;  Location: [facility];  Service: Urology;  Laterality: Right;  . Nephrolithotomy  02/11/2012    Procedure: NEPHROLITHOTOMY PERCUTANEOUS;  Surgeon: Sebastian Acheheodore Manny, MD;  Location: [facility];  Service: Urology;  Laterality: Right;   ROS as in subjective  Objective: BP 120/80 mmHg  Pulse 101  Temp(Src) 98.2 F (36.8 C) (Oral)  Resp 15  Wt 162 lb (73.483 kg)  Gen: wd, wn, nad, lean AA male HENT unremarkable Lungs clear  Abdomen: +bs, soft, mild generalized tenderness, no mass, no obvious hepatosplenomegaly Back: nontender, right low back surgical scar Ext: no edema GU and DRE deferred  Assessment: Encounter Diagnoses  Name Primary?  . Abdominal  pain, unspecified abdominal location Yes  . Right flank pain   . Microscopic hematuria   . Allergic rhinitis, unspecified allergic rhinitis type   . History of kidney stones   . History of sarcoidosis      Plan: Etiology unclear.  He has microscopic hematuria which is not new, but has symptoms today of UTI, prior hx/o UTI, prior hx/o sarcoidosis, proteinuria, and kidney stone too.   Begin cipro, labs and culture today.   Hydrocodone if pain gets much worse, but he is to recheck if severe pain suggesting renal stone.   May need imaging.  Will request prior urology records.  F/u pending labs.

## 2014-06-27 NOTE — Progress Notes (Signed)
LM to [facility] 

## 2014-06-28 LAB — URINE CULTURE

## 2014-07-09 ENCOUNTER — Encounter: Payer: Self-pay | Admitting: Medical

## 2014-07-15 ENCOUNTER — Other Ambulatory Visit: Payer: Self-pay

## 2015-01-19 ENCOUNTER — Encounter (HOSPITAL_COMMUNITY): Payer: Self-pay | Admitting: Emergency Medicine

## 2015-01-19 ENCOUNTER — Emergency Department (HOSPITAL_COMMUNITY)
Admission: EM | Admit: 2015-01-19 | Discharge: 2015-01-19 | Disposition: A | Discharge status: Home / Self Care | Payer: Commercial Managed Care - HMO | Source: Home / Self Care | Attending: Emergency Medicine | Admitting: Emergency Medicine

## 2015-01-19 ENCOUNTER — Emergency Department (HOSPITAL_COMMUNITY): Payer: Commercial Managed Care - HMO

## 2015-01-19 DIAGNOSIS — N200 Calculus of kidney: Secondary | ICD-10-CM

## 2015-01-19 LAB — URINALYSIS, ROUTINE W REFLEX MICROSCOPIC
BILIRUBIN URINE: NEGATIVE
Glucose, UA: NEGATIVE mg/dL
Ketones, ur: NEGATIVE mg/dL
Nitrite: NEGATIVE
Protein, ur: 30 mg/dL — AB
SPECIFIC GRAVITY, URINE: 1.018 (ref 1.005–1.030)
UROBILINOGEN UA: 1 mg/dL (ref 0.0–1.0)
pH: 7 (ref 5.0–8.0)

## 2015-01-19 LAB — CBC WITH DIFFERENTIAL/PLATELET
Basophils Absolute: 0 K/uL (ref 0.0–0.1)
Basophils Relative: 0 %
Eosinophils Absolute: 0.1 K/uL (ref 0.0–0.7)
Eosinophils Relative: 2 %
HCT: 39.1 % (ref 39.0–52.0)
Hemoglobin: 13.1 g/dL (ref 13.0–17.0)
Lymphocytes Relative: 27 %
Lymphs Abs: 1.2 K/uL (ref 0.7–4.0)
MCH: 28.7 pg (ref 26.0–34.0)
MCHC: 33.5 g/dL (ref 30.0–36.0)
MCV: 85.7 fL (ref 78.0–100.0)
Monocytes Absolute: 0.3 K/uL (ref 0.1–1.0)
Monocytes Relative: 7 %
Neutro Abs: 2.7 K/uL (ref 1.7–7.7)
Neutrophils Relative %: 64 %
Platelets: 206 K/uL (ref 150–400)
RBC: 4.56 MIL/uL (ref 4.22–5.81)
RDW: 13.4 % (ref 11.5–15.5)
WBC: 4.3 K/uL (ref 4.0–10.5)

## 2015-01-19 LAB — URINE MICROSCOPIC-ADD ON

## 2015-01-19 LAB — BASIC METABOLIC PANEL WITH GFR
Anion gap: 9 (ref 5–15)
BUN: 14 mg/dL (ref 6–20)
CO2: 27 mmol/L (ref 22–32)
Calcium: 9.5 mg/dL (ref 8.9–10.3)
Chloride: 99 mmol/L — ABNORMAL LOW (ref 101–111)
Creatinine, Ser: 1.65 mg/dL — ABNORMAL HIGH (ref 0.61–1.24)
GFR calc Af Amer: 58 mL/min — ABNORMAL LOW
GFR calc non Af Amer: 50 mL/min — ABNORMAL LOW
Glucose, Bld: 121 mg/dL — ABNORMAL HIGH (ref 65–99)
Potassium: 3.7 mmol/L (ref 3.5–5.1)
Sodium: 135 mmol/L (ref 135–145)

## 2015-01-19 LAB — I-STAT TROPONIN, ED: Troponin i, poc: 0 ng/mL (ref 0.00–0.08)

## 2015-01-19 MED ORDER — OXYCODONE-ACETAMINOPHEN 5-325 MG PO TABS: 1.0000 | ORAL_TABLET | Freq: Once | ORAL | Status: DC

## 2015-01-19 MED ORDER — OXYCODONE-ACETAMINOPHEN 5-325 MG PO TABS
2.0000 | ORAL_TABLET | Freq: Once | ORAL | Status: AC
  Administered 2015-01-19: 2 via ORAL
  Filled 2015-01-19: qty 2

## 2015-01-19 MED ORDER — HYDROMORPHONE HCL 1 MG/ML IJ SOLN
1.0000 mg | Freq: Once | INTRAMUSCULAR | Status: AC
  Administered 2015-01-19: 1 mg via INTRAVENOUS
  Filled 2015-01-19: qty 1

## 2015-01-19 MED ORDER — CIPROFLOXACIN HCL 500 MG PO TABS: 500.0000 mg | ORAL_TABLET | Freq: Two times a day (BID) | ORAL | Status: DC

## 2015-01-19 MED ORDER — OXYCODONE-ACETAMINOPHEN 5-325 MG PO TABS: 2.0000 | ORAL_TABLET | ORAL | Status: DC | PRN

## 2015-01-19 MED ORDER — ONDANSETRON HCL 4 MG/2ML IJ SOLN
4.0000 mg | Freq: Once | INTRAMUSCULAR | Status: AC
  Administered 2015-01-19: 4 mg via INTRAVENOUS
  Filled 2015-01-19: qty 2

## 2015-01-19 MED ORDER — SODIUM CHLORIDE 0.9 % IV BOLUS (SEPSIS)
1000.0000 mL | Freq: Once | INTRAVENOUS | Status: AC
  Administered 2015-01-19: 1000 mL via INTRAVENOUS

## 2015-01-19 MED ORDER — MORPHINE SULFATE (PF) 4 MG/ML IV SOLN
4.0000 mg | Freq: Once | INTRAVENOUS | Status: AC
  Administered 2015-01-19: 4 mg via INTRAVENOUS
  Filled 2015-01-19: qty 1

## 2015-01-19 NOTE — ED Provider Notes (Signed)
CSN: [id number]     Arrival date & time 01/19/15  1643 History   First MD Initiated Contact with Patient 01/19/15 1657     Chief Complaint  Patient presents with  . Flank Pain     (Consider location/radiation/quality/duration/timing/severity/associated sxs/prior Treatment) HPI  Math Brazie is a 39 y.o M with a pmhx of kidney stone requiring a nephrostomy tube, sarcoidosis, who presents to the ED today c/o right-sided flank pain 2 weeks. Patient states that he's been having this right-sided flank pain intermittently for the last 2 weeks. However today patient states the pain became unbearable and is now constant. Patient has associated nausea, no vomiting. Denies dysuria however patient states he feels like he "has to force it out". Denies fever, chills, back pain, weakness, lightheadedness, headache.  Past Medical History  Diagnosis Date  . Sarcoidosis (HCC)   . Hypertension   . Asthma     last asthma attack last year in 2012  . Hearing impairment     wears hearing aids  . Wears glasses   . Allergy   . Renal stone    Past Surgical History  Procedure Laterality Date  . None    . Esophageal dilation    . Cystoscopy/retrograde/ureteroscopy  01/12/2012    Procedure: CYSTOSCOPY/RETROGRADE/URETEROSCOPY;  Surgeon: Sebastian Ache, MD;  Location: [facility];  Service: Urology;  Laterality: Right;  . Nephrolithotomy  02/11/2012    Procedure: NEPHROLITHOTOMY PERCUTANEOUS;  Surgeon: Sebastian Ache, MD;  Location: [facility];  Service: Urology;  Laterality: Right;   Family History  Problem Relation Age of Onset  . Hypertension Mother   . Asthma Mother   . Diabetes Father   . Heart attack Father   . Cancer Paternal Grandmother   . Liver disease Sister    Social History  Substance Use Topics  . Smoking status: Former Smoker -- 0.20 packs/day for 2 years    Types: Cigarettes    Quit date: 04/06/2007  . Smokeless tobacco: Current User  . Alcohol Use: No    Review of Systems  All other  systems reviewed and are negative.     Allergies  Review of patient's allergies indicates no known allergies.  Home Medications   Prior to Admission medications   Medication Sig Start Date End Date Taking? Authorizing Provider  ibuprofen (ADVIL,MOTRIN) 200 MG tablet Take 400 mg by mouth every 6 (six) hours as needed.   Yes Historical Provider, MD  albuterol (PROVENTIL HFA;VENTOLIN HFA) 108 (90 BASE) MCG/ACT inhaler Inhale 2 puffs into the lungs every 6 (six) hours as needed for wheezing. 01/11/14   Kermit Balo Tysinger, PA-C  ciprofloxacin (CIPRO) 500 MG tablet Take 1 tablet (500 mg total) by mouth 2 (two) times daily. 06/26/14   Kermit Balo Tysinger, PA-C  HYDROcodone-acetaminophen (NORCO/VICODIN) 5-325 MG per tablet Take 1 tablet by mouth every 6 (six) hours as needed for moderate pain. 06/26/14   Kermit Balo Tysinger, PA-C  mometasone (ASMANEX 30 METERED DOSES) 220 MCG/INH inhaler Inhale 1 puff into the lungs 2 (two) times daily. 01/11/14   Kermit Balo Tysinger, PA-C   BP 136/88 mmHg  Pulse 84  Temp(Src) 98.3 F (36.8 C) (Oral)  Resp 23  SpO2 91% Physical Exam  Constitutional: He is oriented to person, place, and time. He appears well-developed and well-nourished. He appears distressed.  Pt unable to sit still on bed, writhing in pain  HENT:  Head: Normocephalic and atraumatic.  Mouth/Throat: No oropharyngeal exudate.  Eyes: Conjunctivae and EOM are normal. Pupils are  equal, round, and reactive to light. Right eye exhibits no discharge. Left eye exhibits no discharge. No scleral icterus.  Neck: Normal range of motion. Neck supple.  Cardiovascular: Normal rate, regular rhythm, normal heart sounds and intact distal pulses.  Exam reveals no gallop and no friction rub.   No murmur heard. Pulmonary/Chest: Effort normal and breath sounds normal. No respiratory distress. He has no wheezes. He has no rales. He exhibits no tenderness.  Abdominal: Soft. He exhibits no distension. There is no tenderness.  There is no guarding.  Positive CVA tenderness to right flank  Musculoskeletal: Normal range of motion. He exhibits no edema.  Lymphadenopathy:    He has no cervical adenopathy.  Neurological: He is alert and oriented to person, place, and time. No cranial nerve deficit.  Strength 5/5 throughout. No sensory deficits.  No gait abnormality  Skin: Skin is warm and dry. No rash noted. He is not diaphoretic. No erythema. No pallor.  Psychiatric: He has a normal mood and affect. His behavior is normal.  Nursing note and vitals reviewed.   ED Course  Procedures (including critical care time) Labs Review Labs Reviewed  URINALYSIS, ROUTINE W REFLEX MICROSCOPIC (NOT AT [facility]) - Abnormal; Notable for the following:    APPearance CLOUDY (*)    Hgb urine dipstick SMALL (*)    Protein, ur 30 (*)    Leukocytes, UA MODERATE (*)    All other components within normal limits  BASIC METABOLIC PANEL - Abnormal; Notable for the following:    Chloride 99 (*)    Glucose, Bld 121 (*)    Creatinine, Ser 1.65 (*)    GFR calc non Af Amer 50 (*)    GFR calc Af Amer 58 (*)    All other components within normal limits  URINE CULTURE  CBC WITH DIFFERENTIAL/PLATELET  URINE MICROSCOPIC-ADD ON  I-STAT TROPOININ, ED    Imaging Review Ct Renal Stone Study  01/19/2015  CLINICAL DATA:  Right flank pain for 2 weeks with nausea and vomiting. Difficulty urinating. History of sarcoidosis. EXAM: CT ABDOMEN AND PELVIS WITHOUT CONTRAST TECHNIQUE: Multidetector CT imaging of the abdomen and pelvis was performed following the standard protocol without IV contrast. COMPARISON:  CT abdomen and pelvis 01/04/2012. FINDINGS: Multiple small nodules in the lung bases, more conspicuous on the right, are unchanged and compatible with the patient's history sarcoidosis. The lung bases are otherwise clear. No pleural or pericardial effusion. The patient has severe right hydronephrosis due to a 1.2 cm AP by 0.9 cm transverse by 1.6 cm  craniocaudal proximal ureteral stone. The stone is just below the ureteropelvic junction. 2 additional nonobstructing stones in the right kidney each measure approximately 0.4 cm. 3 nonobstructing left renal stones are also seen. The largest is in the midpole measuring 1 cm in diameter. No other urinary tract stones are identified. The spleen measures approximately 19.5 cm craniocaudal, not notably changed. The gallbladder, liver, adrenal glands, pancreas and biliary tree are unremarkable. A few small retroperitoneal lymph nodes are unchanged. No pathologically enlarged nodes by CT size criteria are seen. There is no fluid collection. The stomach and small and large bowel are unremarkable. No focal bony abnormality is seen. IMPRESSION: Severe right hydronephrosis due to a large proximal right ureteral stone. Additional nonobstructing bilateral renal stones are seen as described above. Micronodularity in the lung bases bilaterally is unchanged and likely related to sarcoidosis. Unchanged splenomegaly. Electronically Signed   By: Drusilla Kannerhomas  Dalessio M.D.   On: 01/19/2015 19:07  I have personally reviewed and evaluated these images and lab results as part of my medical decision-making.   EKG Interpretation   Date/Time:  Sunday January 19 2015 18:17:19 EDT Ventricular Rate:  71 PR Interval:  149 QRS Duration: 86 QT Interval:  398 QTC Calculation: 432 R Axis:   41 Text Interpretation:  Sinus rhythm Anteroseptal infarct, age indeterminate  since last tracing no significant change Confirmed by BELFI  MD, MELANIE  ([id number]) on 01/19/2015 7:19:16 PM      MDM   Final diagnoses:  Kidney stone    Pt with previous hx of kidney stone requiring nephrostomy presents with 2 weeks of intermittent, progressively worsening R flank pain with associated vomiting. Pt in severe pain. No dysuria/hematuria. CT renal stone study reveals severe R hydronephrosis due to a large 1.2x 1.6 proximal R ureteral stone. Additional  non-obstructing bilateral renal calculi. Abnormalities in lungs unchanged from previous study due to sarcoidosis. UA reveals moderate leukocytes.  Spoke with Urology, who recommends given pt 1L fluids and additional pain medications. Pt can follow up outpt in the morning with Dr. Berneice Heinrich.   Pt feels much better after pain medications. No nausea. Will give cipro for UTI. Discussed treatment plan with pt who is agreeable. Return precautions outlined in pt discharge instructions.     Lester Kinsman [place], PA-C 01/20/15 1020  Rolan Bucco, MD 01/20/15 [phone]

## 2015-01-19 NOTE — ED Notes (Signed)
Pt is in a lot of pain, pt ask for pain meds. Nurse notified.

## 2015-01-19 NOTE — ED Notes (Signed)
Pt not In and Out cathed.  Pt voided into urinal.  This RN made a charting error.

## 2015-01-19 NOTE — ED Notes (Signed)
Pt is aware urine is needed, urinal at bedside.  

## 2015-01-19 NOTE — ED Notes (Signed)
R flank pain x 2 weeks with nausea and vomiting.  Reports he is having to use more force to urinate.  History of kidney stones.

## 2015-01-19 NOTE — Discharge Instructions (Signed)
Kidney Stones °Kidney stones (urolithiasis) are deposits that form inside your kidneys. The intense pain is caused by the stone moving through the urinary tract. When the stone moves, the ureter goes into spasm around the stone. The stone is usually passed in the urine.  °CAUSES  °· A disorder that makes certain neck glands produce too much parathyroid hormone (primary hyperparathyroidism). °· A buildup of uric acid crystals, similar to gout in your joints. °· Narrowing (stricture) of the ureter. °· A kidney obstruction present at birth (congenital obstruction). °· Previous surgery on the kidney or ureters. °· Numerous kidney infections. °SYMPTOMS  °· Feeling sick to your stomach (nauseous). °· Throwing up (vomiting). °· Blood in the urine (hematuria). °· Pain that usually spreads (radiates) to the groin. °· Frequency or urgency of urination. °DIAGNOSIS  °· Taking a history and physical exam. °· Blood or urine tests. °· CT scan. °· Occasionally, an examination of the inside of the urinary bladder (cystoscopy) is performed. °TREATMENT  °· Observation. °· Increasing your fluid intake. °· Extracorporeal shock wave lithotripsy--This is a noninvasive procedure that uses shock waves to break up kidney stones. °· Surgery may be needed if you have severe pain or persistent obstruction. There are various surgical procedures. Most of the procedures are performed with the use of small instruments. Only small incisions are needed to accommodate these instruments, so recovery time is minimized. °The size, location, and chemical composition are all important variables that will determine the proper choice of action for you. Talk to your health care provider to better understand your situation so that you will minimize the risk of injury to yourself and your kidney.  °HOME CARE INSTRUCTIONS  °· Drink enough water and fluids to keep your urine clear or pale yellow. This will help you to pass the stone or stone fragments. °· Strain  all urine through the provided strainer. Keep all particulate matter and stones for your health care provider to see. The stone causing the pain may be as small as a grain of salt. It is very important to use the strainer each and every time you pass your urine. The collection of your stone will allow your health care provider to analyze it and verify that a stone has actually passed. The stone analysis will often identify what you can do to reduce the incidence of recurrences. °· Only take over-the-counter or prescription medicines for pain, discomfort, or fever as directed by your health care provider. °· Keep all follow-up visits as told by your health care provider. This is important. °· Get follow-up X-rays if required. The absence of pain does not always mean that the stone has passed. It may have only stopped moving. If the urine remains completely obstructed, it can cause loss of kidney function or even complete destruction of the kidney. It is your responsibility to make sure X-rays and follow-ups are completed. Ultrasounds of the kidney can show blockages and the status of the kidney. Ultrasounds are not associated with any radiation and can be performed easily in a matter of minutes. °· Make changes to your daily diet as told by your health care provider. You may be told to: °¨ Limit the amount of salt that you eat. °¨ Eat 5 or more servings of fruits and vegetables each day. °¨ Limit the amount of meat, poultry, fish, and eggs that you eat. °· Collect a 24-hour urine sample as told by your health care provider. You may need to collect another urine sample every 6-12   months. SEEK MEDICAL CARE IF:  You experience pain that is progressive and unresponsive to any pain medicine you have been prescribed. SEEK IMMEDIATE MEDICAL CARE IF:   Pain cannot be controlled with the prescribed medicine.  You have a fever or shaking chills.  The severity or intensity of pain increases over 18 hours and is not  relieved by pain medicine.  You develop a new onset of abdominal pain.  You feel faint or pass out.  You are unable to urinate.   This information is not intended to replace advice given to you by your health care provider. Make sure you discuss any questions you have with your health care provider.  Follow-up with urology in the morning for further evaluation of kidney stone. If you experience fevers, worsening of your symptoms, vomiting, chills return to the emergency Department immediately.

## 2015-01-20 ENCOUNTER — Inpatient Hospital Stay (HOSPITAL_COMMUNITY)
Admission: AD | Admit: 2015-01-20 | Discharge: 2015-01-23 | DRG: 694 | Disposition: A | Discharge status: Home / Self Care | Payer: Commercial Managed Care - HMO | Source: Ambulatory Visit | Attending: Urology | Admitting: Urology

## 2015-01-20 ENCOUNTER — Observation Stay (HOSPITAL_COMMUNITY): Payer: Commercial Managed Care - HMO

## 2015-01-20 ENCOUNTER — Encounter (HOSPITAL_COMMUNITY): Payer: Self-pay | Admitting: *Deleted

## 2015-01-20 DIAGNOSIS — N1330 Unspecified hydronephrosis: Secondary | ICD-10-CM | POA: Diagnosis present

## 2015-01-20 DIAGNOSIS — N200 Calculus of kidney: Principal | ICD-10-CM | POA: Diagnosis present

## 2015-01-20 DIAGNOSIS — D869 Sarcoidosis, unspecified: Secondary | ICD-10-CM | POA: Diagnosis present

## 2015-01-20 DIAGNOSIS — Z936 Other artificial openings of urinary tract status: Secondary | ICD-10-CM

## 2015-01-20 DIAGNOSIS — H9190 Unspecified hearing loss, unspecified ear: Secondary | ICD-10-CM | POA: Diagnosis present

## 2015-01-20 DIAGNOSIS — B958 Unspecified staphylococcus as the cause of diseases classified elsewhere: Secondary | ICD-10-CM | POA: Diagnosis present

## 2015-01-20 DIAGNOSIS — J45909 Unspecified asthma, uncomplicated: Secondary | ICD-10-CM | POA: Diagnosis present

## 2015-01-20 DIAGNOSIS — N179 Acute kidney failure, unspecified: Secondary | ICD-10-CM | POA: Diagnosis present

## 2015-01-20 DIAGNOSIS — Z833 Family history of diabetes mellitus: Secondary | ICD-10-CM

## 2015-01-20 DIAGNOSIS — Z79899 Other long term (current) drug therapy: Secondary | ICD-10-CM

## 2015-01-20 DIAGNOSIS — N136 Pyonephrosis: Secondary | ICD-10-CM | POA: Diagnosis present

## 2015-01-20 DIAGNOSIS — Z8249 Family history of ischemic heart disease and other diseases of the circulatory system: Secondary | ICD-10-CM

## 2015-01-20 DIAGNOSIS — Z809 Family history of malignant neoplasm, unspecified: Secondary | ICD-10-CM

## 2015-01-20 DIAGNOSIS — Z825 Family history of asthma and other chronic lower respiratory diseases: Secondary | ICD-10-CM

## 2015-01-20 DIAGNOSIS — I10 Essential (primary) hypertension: Secondary | ICD-10-CM | POA: Diagnosis present

## 2015-01-20 DIAGNOSIS — E860 Dehydration: Secondary | ICD-10-CM | POA: Diagnosis present

## 2015-01-20 DIAGNOSIS — N201 Calculus of ureter: Secondary | ICD-10-CM

## 2015-01-20 DIAGNOSIS — Z79891 Long term (current) use of opiate analgesic: Secondary | ICD-10-CM

## 2015-01-20 DIAGNOSIS — I959 Hypotension, unspecified: Secondary | ICD-10-CM | POA: Diagnosis present

## 2015-01-20 LAB — PROTIME-INR
INR: 1.09 (ref 0.00–1.49)
Prothrombin Time: 14.3 seconds (ref 11.6–15.2)

## 2015-01-20 LAB — APTT: aPTT: 47 seconds — ABNORMAL HIGH (ref 24–37)

## 2015-01-20 MED ORDER — SODIUM CHLORIDE 0.9 % IV SOLN
INTRAVENOUS | Status: DC
  Administered 2015-01-20 – 2015-01-21 (×2): 125 mL/h via INTRAVENOUS
  Administered 2015-01-21: via INTRAVENOUS

## 2015-01-20 MED ORDER — MEPERIDINE HCL 25 MG/ML IJ SOLN: 25.0000 mg | Freq: Once | INTRAMUSCULAR | Status: AC

## 2015-01-20 MED ORDER — LIDOCAINE HCL 1 % IJ SOLN
INTRAMUSCULAR | Status: AC
  Filled 2015-01-20: qty 20

## 2015-01-20 MED ORDER — MIDAZOLAM HCL 2 MG/2ML IJ SOLN
INTRAMUSCULAR | Status: AC
  Filled 2015-01-20: qty 4

## 2015-01-20 MED ORDER — VANCOMYCIN HCL IN DEXTROSE 750-5 MG/150ML-% IV SOLN
750.0000 mg | Freq: Two times a day (BID) | INTRAVENOUS | Status: DC
  Administered 2015-01-20 – 2015-01-21 (×2): 750 mg via INTRAVENOUS
  Filled 2015-01-20 (×2): qty 150

## 2015-01-20 MED ORDER — DOCUSATE SODIUM 100 MG PO CAPS
100.0000 mg | ORAL_CAPSULE | Freq: Two times a day (BID) | ORAL | Status: DC
  Administered 2015-01-20 – 2015-01-22 (×5): 100 mg via ORAL
  Filled 2015-01-20 (×5): qty 1

## 2015-01-20 MED ORDER — MAGNESIUM HYDROXIDE 400 MG/5ML PO SUSP: 30.0000 mL | Freq: Every day | ORAL | Status: DC | PRN

## 2015-01-20 MED ORDER — FENTANYL CITRATE (PF) 100 MCG/2ML IJ SOLN
INTRAMUSCULAR | Status: AC | PRN
  Administered 2015-01-20: 25 ug via INTRAVENOUS
  Administered 2015-01-20: 50 ug via INTRAVENOUS
  Administered 2015-01-20: 25 ug via INTRAVENOUS

## 2015-01-20 MED ORDER — BUDESONIDE 0.5 MG/2ML IN SUSP
0.5000 mg | Freq: Two times a day (BID) | RESPIRATORY_TRACT | Status: DC
  Administered 2015-01-20 – 2015-01-23 (×5): 0.5 mg via RESPIRATORY_TRACT
  Filled 2015-01-20 (×5): qty 2

## 2015-01-20 MED ORDER — LEVOFLOXACIN IN D5W 750 MG/150ML IV SOLN
750.0000 mg | INTRAVENOUS | Status: DC
  Administered 2015-01-20: 750 mg via INTRAVENOUS
  Filled 2015-01-20: qty 150

## 2015-01-20 MED ORDER — MORPHINE SULFATE (PF) 2 MG/ML IV SOLN
2.0000 mg | INTRAVENOUS | Status: DC | PRN
  Administered 2015-01-21 – 2015-01-23 (×6): 2 mg via INTRAVENOUS
  Filled 2015-01-20 (×6): qty 1

## 2015-01-20 MED ORDER — IBUPROFEN 200 MG PO TABS
400.0000 mg | ORAL_TABLET | Freq: Once | ORAL | Status: AC
  Administered 2015-01-20: 400 mg via ORAL
  Filled 2015-01-20: qty 2

## 2015-01-20 MED ORDER — MEPERIDINE HCL 100 MG/ML IJ SOLN
INTRAMUSCULAR | Status: AC
  Administered 2015-01-20: 25 mg
  Filled 2015-01-20: qty 1

## 2015-01-20 MED ORDER — FENTANYL CITRATE (PF) 100 MCG/2ML IJ SOLN
INTRAMUSCULAR | Status: AC
  Filled 2015-01-20: qty 2

## 2015-01-20 MED ORDER — VANCOMYCIN HCL IN DEXTROSE 750-5 MG/150ML-% IV SOLN
750.0000 mg | Freq: Once | INTRAVENOUS | Status: AC
  Administered 2015-01-20: 750 mg via INTRAVENOUS
  Filled 2015-01-20: qty 150

## 2015-01-20 MED ORDER — IOHEXOL 300 MG/ML  SOLN
20.0000 mL | Freq: Once | INTRAMUSCULAR | Status: AC | PRN
  Administered 2015-01-20: 50 mL

## 2015-01-20 MED ORDER — MIDAZOLAM HCL 2 MG/2ML IJ SOLN
INTRAMUSCULAR | Status: AC | PRN
  Administered 2015-01-20 (×3): 1 mg via INTRAVENOUS

## 2015-01-20 MED ORDER — ALBUTEROL SULFATE (2.5 MG/3ML) 0.083% IN NEBU: 2.5000 mg | INHALATION_SOLUTION | Freq: Four times a day (QID) | RESPIRATORY_TRACT | Status: DC | PRN

## 2015-01-20 MED ORDER — ONDANSETRON HCL 4 MG/2ML IJ SOLN: 4.0000 mg | INTRAMUSCULAR | Status: DC | PRN

## 2015-01-20 MED ORDER — ACETAMINOPHEN 325 MG PO TABS
650.0000 mg | ORAL_TABLET | ORAL | Status: DC | PRN
  Administered 2015-01-20 – 2015-01-22 (×6): 650 mg via ORAL
  Filled 2015-01-20 (×6): qty 2

## 2015-01-20 NOTE — Discharge Instructions (Signed)
Percutaneous Nephrostomy Home Guide  A nephrostomy tube allows urine to leave your body when a medical condition prevents it from leaving your kidney normally. Urine is normally carried from the kidneys to the bladder through narrow tubes called ureters. The ureter can become obstructed due to conditions such as kidney stones, tumors, infection, or blood clots. A nephrostomy tube is a hollow, flexible tube placed into the kidney to restore the flow of urine. The tube is placed on the right or left side of your lower back and is connected to an external drainage bag.  PERSONAL HYGIENE  · You may shower unless otherwise told by your health care provider. Prepare for a shower by placing a plastic covering over the nephrostomy tube dressing.  · Change the dressing immediately after showering. Make sure your skin around the nephrostomy tube exit site is dry.  · Avoid immersing your nephrostomy tube in water, such as taking a bath or swimming.  NEPHROSTOMY TUBE CARE  · Your nephrostomy tube is connected to a leg bag or bedside drainage bag. Always keep your tubing, leg bag, or bedside drainage bag below the level of your kidney so that your urine drains freely.  · Your activity is not restricted as long as your activity does not pull or tug on your tube.  · During the day, if you are connecting your nephrostomy tube to a leg bag, ensure that your tubing does not have any kinks and that your urine is draining freely. One helpful technique to prevent kinking or inadvertent dislodging of your nephrostomy tubing is to gently wrap an elastic bandage over the tubing. This will help to secure the tubing in place. Make sure there is no tension on the tubing so it does not become dislodged.  · At night, you may want to connect your nephrostomy tube to a larger bedside drainage bag.  EMPTYING THE LEG BAG OR BEDSIDE DRAINAGE BAG   The leg bag or bedside drainage bag should be emptied when it becomes  full and before going to sleep.  Most leg bags and bedside drainage bags have a drain at the bottom that allows urine to be emptied.  1. Hold the leg bag or bedside drainage bag over a toilet or collection container. Use a measuring container if you are directed to measure your urine.  2. Open the drain and allow the urine to drain.  3. Once all the urine is drained from the leg bag or bedside drainage bag, close the drain fully to avoid urine leakage.  4. Flush urine down toilet. If a collection container was used, rinse the container.  NEPHROSTOMY TUBE EXIT SITE CARE  The exit site for your nephrostomy tube is covered with a bandage (dressing). Clean your exit site and change your dressing as directed by your health care provider or if your dressing becomes wet.  Supplies Needed:  · Mild soap and water.  · 4×4 inch (10x10 cm) split gauze pads.  · 4×4 inch (10x10 cm) gauze pads.  · Paper tape.  Exit Site Care and Dressing Change:For the first 2 weeks after having a nephrostomy tube inserted, you should change the dressing every day. After 2 weeks, you can change the dressing 2 times per week, whenever the dressing becomes wet, or as told by your health care provider. Because of the location of your nephrostomy tube, you may need help from another person to complete dressing changes. The steps to changing a dressing are:  1. Wash hands   well with soap and water.  2. Gently remove the tapes and dressing from around the nephrostomy tube. Be careful not to pull on the tube while removing the dressing. Avoid using scissors to remove the dressing since this may lead to accidental damage to the tube.  3. Wash the skin around the tube with the mild soap and water, rinse well, and dry with a clean cloth.  4. Inspect the skin around the drain for redness, swelling, and foul-smelling yellow or green discharge.  5. If the drain was sutured to the skin, inspect the suture to verify that it is still anchored in the skin.  6. Place two split gauze pads in and  around the tube exit site. Do not apply ointments or alcohol to the site.  7. Place a gauze pad on top of the split gauze pad.  8. Coil the tube on top of the gauze. The tubing should rest on the gauze, not on the skin.  9. Place tape around each edge of the gauze pad.  10. Secure the nephrostomy tubing. Ensure that the nephrostomy tube does not kink or become pinched. The tubing should rest on the gauze pad and not on the skin.  11. Dispose of used supplies properly.  FLUSHING YOUR NEPHROSTOMY TUBE   Flush your nephrostomy tube as directed by your health care provider. Flushing of a nephrostomy tube is easier if a three-way stopcock is placed between the tube and the leg bag or bedside drainage bag. One connection of the three-way stopcock connects to your tube, the second connects to the leg bag or bedside drainage bag, and the third connection is usually covered with a cap. The three-way stopcock lever points to the direction on the stopcock that is closed to flow. Normally, the lever points in the direction of the cap to allow urine to drain from the tube to the leg bag or bedside drainage bag.  Supplies Needed:  · Rubbing alcohol wipe.  · 10 mL 0.9% saline syringe.  Flushing Your Nephrostomy Tube  1. Gather needed supplies.  2. Move the lever of the three-way stopcock so that it points toward the leg bag or bedside drainage bag.  3. Clean the cap with a rubbing alcohol wipe and then screw the tip of a 10 mL 0.9% saline syringe onto the cap.  4. Using the syringe plunger, slowly push the 10 mL 0.9% saline in the syringe over 5-10 seconds. If resistance is met or pain occurs while pushing, stop pushing the saline immediately.  5. Remove the syringe from the cap.  6. Return the stopcock lever to the usual position which is pointing in the direction of the cap.  7. Dispose of used supplies properly.  REPLACING YOUR LEG BAG OR BEDSIDE DRAINAGE BAG   Replace your leg bag or bedside drainage bag, three-way stopcock,  and any extension tubing as directed by your health care provider. Make sure you always have an extra drainage bag and connecting tubing available.  1. Empty urine from your leg bag or bedside drainage bag.  2. Gather new leg bag or bedside drainage bag, three-way stopcock, and any extension tubing.  3. Remove the leg bag or bedside drainage bag, three-way stopcock, and any extension tubing from the nephrostomy tube.  4. Attach the new leg bag or bedside drainage bag, three-way stopcock, and any extension tubing to the nephrostomy tube.  5. Dispose of the used leg bag or bedside drainage bag, three-way stopcock, and any extension   difficulty or pain with flushing the tube.  You notice a decrease in your urine output not explained by drinking less fluids.  Your nephrostomy tube comes out or the suture securing the tube comes free.   This information is not intended to replace advice given to you by your health care provider. Make sure you discuss any questions you have with your health care provider.   Document Released: 01/10/2013 Document Revised: 03/27/2013 Document Reviewed: 01/10/2013 Elsevier Interactive Patient Education 2016 [employer] Percutaneous Nephrostomy Percutaneous nephrostomy is the insertion of a flexible tube into your kidney through your back. This is done to provide access to an obstructed kidney. The goal of this procedure is to allow the urine that is produced in the kidney to drain, which will relieve pressure or infection from damaging your kidney. This will allow your health care provider to identify the cause of the obstruction and plan appropriate  treatment. LET [facility] CARE PROVIDER KNOW ABOUT:  Any allergies you have.  All medicines you are taking, including vitamins, herbs, eye drops, creams, and over-the-counter medicines.  Previous problems you or members of your family have had with the use of anesthetics.  Any blood disorders you have.  Previous surgeries you have had.  Medical conditions you have.  Possibility of pregnancy, if this applies. RISKS AND COMPLICATIONS Generally, this is a safe procedure. However, as with any procedure, problems can occur. Possible problems include:  Infection.  Damage to the organs surrounding your kidney. BEFORE THE PROCEDURE Your health care provider may want you to have blood tests. These tests can help tell how well your kidneys and liver are working. They can also show how well your blood clots. If you take anticoagulant medicine, sometimes called blood thinners, ask your health care provider when you should stop taking them. Make arrangements for someone to take you home after the procedure, if needed. PROCEDURE The procedure is performed as follows: 5. An intravenous IV catheter will be inserted into one of the veins in your arm. Medicine will be able to flow directly into your body through this catheter. You may be given medicines through this tube to help prevent nausea and pain, and antibiotics to help prevent infection.  6. You will be placed on your stomach and given medicine that numbs the site (local anesthetic) where the percutaneous nephrostomy tube will be inserted. 7. You will be given a medicine that makes you go to sleep (general anesthetic). 8. The percutaneous nephrostomy tube, which is thin and flexible, will be inserted into a needle. 9. The needle will be inserted into your body and guided to your kidney with the help of an imaging method that uses X-ray images (fluoroscopy). 10. A dye will be injected through the nephrostomy tube. Then, X-ray images that  highlight your kidney will be taken. 11. The needle is then removed, but the nephrostomy tube will be left in your kidney. The tube will drain urine from your kidney to a collection bag outside your body. The tube is usually secured to your skin with stitches (sutures). AFTER THE PROCEDURE   You will stay in a recovery area until the sedation has worn off. Your blood pressure and pulse will be checked.  You will need to remain lying down for several hours.   This information is not intended to replace advice given to you by your health care provider. Make sure you discuss any questions you have with your health care provider.   Document Released: 01/10/2013  Document Revised: 04/12/2014 Document Reviewed: 01/10/2013 Elsevier Interactive Patient Education [employer].

## 2015-01-20 NOTE — Progress Notes (Signed)
  Pt s/p right Nx tube. Eating dinner. Has some chills.   UOP in right Nx with mild hematuria.   S/p right Perc nx tube. Cx's pending. On vanc and levaquin.

## 2015-01-20 NOTE — Procedures (Signed)
Successful RT PCN INSERTION NO COMP STABLE FULL REPORT IN PACS

## 2015-01-20 NOTE — Progress Notes (Signed)
Patient ID: [name], male   DOB: 01/01/1976, 39 y.o.   MRN: [id number]    Referring Physician(s): Manny,T  Chief Complaint: Right ureteral stone/ hydronephrosis   Subjective: Patient familiar to IR service from prior right percutaneous nephrostomy secondary to obstructing ureteral stone on 01/14/12. He is admitted now with complaints of right-sided flank pain for the past 2 weeks with associated nausea and vomiting. CT of the abdomen and pelvis on 01/19/15 revealed severe right hydronephrosis secondary to a large proximal right ureteral stone with additional nonobstructing bilateral renal stones. Request now received for right percutaneous nephrostomy. Patient currently denies fever, headache, chest pain, cough, dyspnea hematuria/dysuria or abnormal bleeding. Past Medical History  Diagnosis Date  . Sarcoidosis (HCC)   . Hypertension   . Asthma     last asthma attack last year in 2012  . Hearing impairment     wears hearing aids  . Wears glasses   . Allergy   . Renal stone    Past Surgical History  Procedure Laterality Date  . None    . Esophageal dilation    . Cystoscopy/retrograde/ureteroscopy  01/12/2012    Procedure: CYSTOSCOPY/RETROGRADE/URETEROSCOPY;  Surgeon: Sebastian Ache, MD;  Location: [facility];  Service: Urology;  Laterality: Right;  . Nephrolithotomy  02/11/2012    Procedure: NEPHROLITHOTOMY PERCUTANEOUS;  Surgeon: Sebastian Ache, MD;  Location: [facility];  Service: Urology;  Laterality: Right;      Allergies: Review of patient's allergies indicates no known allergies.  Medications: Prior to Admission medications   Medication Sig Start Date End Date Taking? Authorizing Provider  albuterol (PROVENTIL HFA;VENTOLIN HFA) 108 (90 BASE) MCG/ACT inhaler Inhale 2 puffs into the lungs every 6 (six) hours as needed for wheezing. 01/11/14   Kermit Balo Tysinger, PA-C  ciprofloxacin (CIPRO) 500 MG tablet Take 1 tablet (500 mg total) by mouth 2 (two) times daily. 06/26/14   Kermit Balo Tysinger, PA-C  ciprofloxacin (CIPRO) 500 MG tablet Take 1 tablet (500 mg total) by mouth 2 (two) times daily. 01/19/15   Samantha Tripp Dowless, PA-C  HYDROcodone-acetaminophen (NORCO/VICODIN) 5-325 MG per tablet Take 1 tablet by mouth every 6 (six) hours as needed for moderate pain. 06/26/14   Kermit Balo Tysinger, PA-C  ibuprofen (ADVIL,MOTRIN) 200 MG tablet Take 400 mg by mouth every 6 (six) hours as needed.    Historical Provider, MD  mometasone (ASMANEX 30 METERED DOSES) 220 MCG/INH inhaler Inhale 1 puff into the lungs 2 (two) times daily. 01/11/14   Kermit Balo Tysinger, PA-C  oxyCODONE-acetaminophen (PERCOCET/ROXICET) 5-325 MG tablet Take 2 tablets by mouth every 4 (four) hours as needed for severe pain. 01/19/15   Samantha Tripp Dowless, PA-C     Vital Signs: BP 145/95 mmHg  Pulse 105  Temp(Src) 99.3 F (37.4 C) (Oral)  Resp 20  Ht 6' (1.829 m)  Wt 164 lb 4 oz (74.503 kg)  BMI 22.27 kg/m2  SpO2 97%  Physical Exam patient awake, alert. Chest clear to auscultation bilaterally. Heart with slightly tachycardic but regular rhythm. Abdomen soft, positive bowel sounds, mild right lateral abdominal/flank discomfort to palpation; extremities with full range of motion and no edema.  Imaging: Ct Renal Stone Study  01/19/2015  CLINICAL DATA:  Right flank pain for 2 weeks with nausea and vomiting. Difficulty urinating. History of sarcoidosis. EXAM: CT ABDOMEN AND PELVIS WITHOUT CONTRAST TECHNIQUE: Multidetector CT imaging of the abdomen and pelvis was performed following the standard protocol without IV contrast. COMPARISON:  CT abdomen and pelvis 01/04/2012. FINDINGS: Multiple small  nodules in the lung bases, more conspicuous on the right, are unchanged and compatible with the patient's history sarcoidosis. The lung bases are otherwise clear. No pleural or pericardial effusion. The patient has severe right hydronephrosis due to a 1.2 cm AP by 0.9 cm transverse by 1.6 cm craniocaudal proximal ureteral  stone. The stone is just below the ureteropelvic junction. 2 additional nonobstructing stones in the right kidney each measure approximately 0.4 cm. 3 nonobstructing left renal stones are also seen. The largest is in the midpole measuring 1 cm in diameter. No other urinary tract stones are identified. The spleen measures approximately 19.5 cm craniocaudal, not notably changed. The gallbladder, liver, adrenal glands, pancreas and biliary tree are unremarkable. A few small retroperitoneal lymph nodes are unchanged. No pathologically enlarged nodes by CT size criteria are seen. There is no fluid collection. The stomach and small and large bowel are unremarkable. No focal bony abnormality is seen. IMPRESSION: Severe right hydronephrosis due to a large proximal right ureteral stone. Additional nonobstructing bilateral renal stones are seen as described above. Micronodularity in the lung bases bilaterally is unchanged and likely related to sarcoidosis. Unchanged splenomegaly. Electronically Signed   By: Drusilla Kannerhomas  Dalessio M.D.   On: 01/19/2015 19:07    Labs:  CBC:  Recent Labs  06/26/14 0001 01/19/15 1803  WBC 4.4 4.3  HGB 13.5 13.1  HCT 39.6 39.1  PLT 274 206    COAGS: No results for input(s): INR, APTT in the last 8760 hours.  BMP:  Recent Labs  06/26/14 0001 01/19/15 1803  NA 138 135  K 4.8 3.7  CL 103 99*  CO2 27 27  GLUCOSE 93 121*  BUN 15 14  CALCIUM 9.6 9.5  CREATININE 1.18 1.65*  GFRNONAA  --  50*  GFRAA  --  58*    LIVER FUNCTION TESTS:  Recent Labs  06/26/14 0001  BILITOT 0.8  AST 22  ALT 14  ALKPHOS 100  PROT 7.9  ALBUMIN 4.1    Assessment and Plan: Pt with past medical history significant for sarcoidosis, hypertension, asthma, nephrolithiasis with prior right percutaneous nephrostomy 2013, recently admitted with right flank pain, nausea/ vomiting, mildly elevated creatinine at 1.65 and findings of severe right hydronephrosis secondary to a large right proximal  ureteral stone by CT on 10/16. Request now received from urology for right percutaneous nephrostomy. Imaging studies have been reviewed by Dr. Miles CostainShick. Risks and benefits discussed with the patient including, but not limited to infection, bleeding, significant bleeding causing loss or decrease in renal function or damage to adjacent structures. All of the patient's questions were answered, patient is agreeable to proceed.Consent signed and in chart. Procedure scheduled for later this afternoon.     Signed: D. Jeananne RamaKevin Allred 01/20/2015, 2:01 PM   I spent a total of 20 minutes at the the patient's bedside AND on the patient's hospital floor or unit, greater than 50% of which was counseling/coordinating care for right percutaneous nephrostomy

## 2015-01-20 NOTE — Progress Notes (Signed)
ANTIBIOTIC CONSULT NOTE - INITIAL  Pharmacy Consult for vanco/levofloxacin Indication: UTI  No Known Allergies  Patient Measurements: Height: 6' (182.9 cm) Weight: 164 lb 4 oz (74.503 kg) IBW/kg (Calculated) : 77.6 Adjusted Body Weight:   Vital Signs: Temp: 99.3 F (37.4 C) (10/17 1323) Temp Source: Oral (10/17 1323) BP: 145/95 mmHg (10/17 1323) Pulse Rate: 105 (10/17 1323) Intake/Output from previous day:   Intake/Output from this shift:    Labs:  Recent Labs  01/19/15 1803  WBC 4.3  HGB 13.1  PLT 206  CREATININE 1.65*   Estimated Creatinine Clearance: 62.1 mL/min (by C-G formula based on Cr of 1.65). No results for input(s): VANCOTROUGH, VANCOPEAK, VANCORANDOM, GENTTROUGH, GENTPEAK, GENTRANDOM, TOBRATROUGH, TOBRAPEAK, TOBRARND, AMIKACINPEAK, AMIKACINTROU, AMIKACIN in the last 72 hours.   Microbiology: Recent Results (from the past 720 hour(s))  Urine culture     Status: None (Preliminary result)   Collection Time: 01/19/15  5:50 PM  Result Value Ref Range Status   Specimen Description URINE, RANDOM  Final   Special Requests NONE  Final   Culture NO GROWTH < 24 HOURS  Final   Report Status PENDING  Incomplete    Medical History: Past Medical History  Diagnosis Date  . Sarcoidosis (HCC)   . Hypertension   . Asthma     last asthma attack last year in 2012  . Hearing impairment     wears hearing aids  . Wears glasses   . Allergy   . Renal stone    Assessment: [age] YOM with history of kidney stone and nephrostomy tube, presents with flank pain with stone on CT.  Has h/o of Oxacillin-sensitive CoNS in urine culture (01/2014)  10/16 urine: pending  Renal: Scr was elevated on 10/16 labs WBC WNL  Goal of Therapy:  Vancomycin trough level 10-15 mcg/ml  Plan:   Vancomycin 750mg  IV q12h  Watch renal function  levofloxaxin 750mg  IV q24h  Await culture results  Juliette Alcideustin Zeigler, PharmD, BCPS.   Pager: [phone] 01/20/2015,1:45 PM

## 2015-01-20 NOTE — H&P (Signed)
History of Present Illness Consultation for right proximal ureteral stone referred by Dr. Fredderick PhenixBelfi. Patient of Dr. Berneice HeinrichManny. PCP PA Tysinger. He was seen yesterday in the emergency department with severe right flank pain which had worsened over about a 2 week period. His white count was 4, vitals stable, creatinine slightly elevated at 1.65. UA showed 21-50 whites and 11-20 red blood culture has come back negative. CT scan of the abdomen and pelvis was obtained which revealed a 16 x 8 mm right proximal ureteral stone at the UPJ with hydronephrosis (visible on scout, 1200 HU). There was a 3-4 mm right lower pole stone. A 10 mm left renal stone. I reviewed all the images. He was started on Cipro.    His UA today shows no bacteria. Prior cultures have grown staph sensitive to Levaquin, nitrofurantoin and vancomycin but otherwise showing significant resistance.    Today, he continues to have severe right flank pain, nausea vomiting. He has a temperature of 101.8 in the office. His UA shows a few red cells and a few white cells and no bacteria.      GU hx:   1 - Surgical Nephrolithiasis - s/p Rt PCNL 02/11/2012 for severely impacted rt proximal ureteral stone that failed prior attempt at ureteroscopic approach. Composition CaOx. First stone.    Surveillance:  11/2012 - KUB stone free    2 - Metabolic Stone Disease / Hypercalciuria:  2013: BMP,PTH,Uric Acid - normal, 24 hr urine - high calcium + low volume, Composition - CaOx.    Started on regimen of indapamide 2.5 daily + increased fluid intake. Poor compliance.     PMH: HTN, sarcoid   Past Medical History Problems  1. History of Asthma (J45.909) 2. History of Calculus of right ureter (N20.1) 3. History of Hydronephrosis, right (N13.30) 4. History of Nephrolithiasis Of The Right Kidney  Surgical History Problems  1. History of Cystoscopy With Ureteroscopy Right 2. History of No Surgical Problems 3. History of Percutaneous  Change Of Nephrostomy Tube 4. History of Percutaneous Lithotomy 5. History of Renal Endoscopy Via Nephrostomy With Ureteral Catheterizati  Current Meds 1. Ciprofloxacin HCl - 500 MG Oral Tablet;  Therapy: (Recorded:17Oct2016) to Recorded 2. Indapamide 2.5 MG Oral Tablet; TAKE 1 TABLET DAILY;  Therapy: 28Aug2014 to (Evaluate:23Aug2015)  Requested for: 28Aug2014; Last  Rx:28Aug2014 Ordered 3. Oxycodone-Acetaminophen 5-325 MG Oral Tablet;  Therapy: (Recorded:17Oct2016) to Recorded  Allergies Medication  1. No Known Drug Allergies  Family History Problems  1. Family history of Acute Myocardial Infarction 2. Family history of Diabetes Mellitus 3. Family history of Hypertension  Social History Problems    Denied: History of Alcohol Use (History)   Caffeine Use   Marital History - Single   Never A Smoker  Review of Systems  Gastrointestinal: nausea, vomiting and diarrhea.  Constitutional: night sweats, feeling tired (fatigue) and recent weight loss.  Eyes: blurred vision.  ENT: sore throat and sinus problems.  Cardiovascular: chest pain.  Respiratory: shortness of breath and cough.  Endocrine: polydipsia.  Musculoskeletal: back pain.  Neurological: headache.    Vitals Vital Signs [Data Includes: Last 1 Day]  Recorded: 17Oct2016 11:22AM  Height: 6 ft  Weight: 166 lb  BMI Calculated: 22.51 BSA Calculated: 1.97 Blood Pressure: 144 / 94 Temperature: 101.8 F Heart Rate: 111  Physical Exam Constitutional: Well nourished and well developed . No acute distress.  Pulmonary: No respiratory distress and normal respiratory rhythm and effort.  Cardiovascular: Heart rate and rhythm are normal . No peripheral edema.  Neuro/Psych:. Mood and affect are appropriate.    Results/Data Urine [Data Includes: Last 1 Day]   17Oct2016  COLOR YELLOW   APPEARANCE CLOUDY   SPECIFIC GRAVITY 1.025   pH 6.0   GLUCOSE NEGATIVE   BILIRUBIN NEGATIVE   KETONE NEGATIVE   BLOOD 2+    PROTEIN NEGATIVE   NITRITE NEGATIVE   LEUKOCYTE ESTERASE 2+   SQUAMOUS EPITHELIAL/HPF NONE SEEN HPF  WBC 20-40 WBC/HPF  RBC 20-40 RBC/HPF  BACTERIA NONE SEEN HPF  CRYSTALS NONE SEEN HPF  CASTS NONE SEEN LPF  Yeast NONE SEEN HPF   Old records or history reviewed:Marland Kitchen  The following images/tracing/specimen were independently visualized: Marland Kitchen    Procedure He was given 12 mg IM morphine and 12.5 mg IM Phenergan today.     Assessment Assessed  1. Hydronephrosis with urinary obstruction due to renal calculus (N13.2) 2. Nephrolithiasis (N20.0) 3. Fever (R50.9) 4. Acute pyelonephritis (N10)  Plan Health Maintenance  1. UA With REFLEX; [Do Not Release]; Status:Complete;   Done: 17Oct2016 11:10AM Hydronephrosis with urinary obstruction due to renal calculus  2. INTERVENTIONAL RADIOLOGY; Status:Hold For - Appointment,PreCert,Print,Records;  Requested for:17Oct2016;  Nephrolithiasis  3. Administered: Morphine Sulfate 4 MG/ML Injection Solution 4. Follow-up Schedule Surgery Office  Follow-up  Status: Hold For - Appointment   Requested for: 17Oct2016  Discussion/Summary  Right UPJ stone, fever - given his prior difficult access, large stone and high HU, I discussed with him the nature risks and benefits of cystoscopy with right retrograde pyelogram and right ureteral stent placement versus right percutaneous nephrostomy tube. We talked about immediate drainage of the kidney with delayed stone management. We discussed management of the stone including nature, r/b of ESWL, URS and PCNL. Again given the stone size, location and intensity of PCNL would give him the most reasonable chance of success without requiring a staged procedure. All questions answered. He elected to proceed with right perc. nephrostomy. Also, discussed the importance of long-term follow-up and compliance with medication to help prevent stones.   I spoke with Dr. Denny Levy over at [facility] interventional  radiology and they will  be on the look out for the patient. We spoke to bed 2  control and patient will be admitted to [facility] at the current time. I reminded the patient to remain nothing by mouth.       1 Amended By: Jerilee Field; Jan 20 2015 12:40 PM EST  2 Amended By: Jerilee Field; Jan 20 2015 1:36 PM EST  Signatures Electronically signed by : Jerilee Field, M.D.; Jan 20 2015 12:36PM EST Electronically signed by : Jerilee Field, M.D.; Jan 20 2015 12:40PM EST Electronically signed by : Jerilee Field, M.D.; Jan 20 2015  1:37PM EST

## 2015-01-21 DIAGNOSIS — N200 Calculus of kidney: Secondary | ICD-10-CM | POA: Diagnosis present

## 2015-01-21 DIAGNOSIS — Z936 Other artificial openings of urinary tract status: Secondary | ICD-10-CM | POA: Diagnosis not present

## 2015-01-21 DIAGNOSIS — Z8249 Family history of ischemic heart disease and other diseases of the circulatory system: Secondary | ICD-10-CM | POA: Diagnosis not present

## 2015-01-21 DIAGNOSIS — N136 Pyonephrosis: Secondary | ICD-10-CM | POA: Diagnosis present

## 2015-01-21 DIAGNOSIS — R109 Unspecified abdominal pain: Secondary | ICD-10-CM | POA: Diagnosis present

## 2015-01-21 DIAGNOSIS — H9190 Unspecified hearing loss, unspecified ear: Secondary | ICD-10-CM | POA: Diagnosis present

## 2015-01-21 DIAGNOSIS — Z79891 Long term (current) use of opiate analgesic: Secondary | ICD-10-CM | POA: Diagnosis not present

## 2015-01-21 DIAGNOSIS — N179 Acute kidney failure, unspecified: Secondary | ICD-10-CM | POA: Diagnosis present

## 2015-01-21 DIAGNOSIS — Z79899 Other long term (current) drug therapy: Secondary | ICD-10-CM | POA: Diagnosis not present

## 2015-01-21 DIAGNOSIS — Z809 Family history of malignant neoplasm, unspecified: Secondary | ICD-10-CM | POA: Diagnosis not present

## 2015-01-21 DIAGNOSIS — Z833 Family history of diabetes mellitus: Secondary | ICD-10-CM | POA: Diagnosis not present

## 2015-01-21 DIAGNOSIS — I10 Essential (primary) hypertension: Secondary | ICD-10-CM | POA: Diagnosis present

## 2015-01-21 DIAGNOSIS — D869 Sarcoidosis, unspecified: Secondary | ICD-10-CM | POA: Diagnosis present

## 2015-01-21 DIAGNOSIS — I959 Hypotension, unspecified: Secondary | ICD-10-CM | POA: Diagnosis present

## 2015-01-21 DIAGNOSIS — B958 Unspecified staphylococcus as the cause of diseases classified elsewhere: Secondary | ICD-10-CM | POA: Diagnosis present

## 2015-01-21 DIAGNOSIS — J45909 Unspecified asthma, uncomplicated: Secondary | ICD-10-CM | POA: Diagnosis present

## 2015-01-21 DIAGNOSIS — Z825 Family history of asthma and other chronic lower respiratory diseases: Secondary | ICD-10-CM | POA: Diagnosis not present

## 2015-01-21 DIAGNOSIS — E860 Dehydration: Secondary | ICD-10-CM | POA: Diagnosis present

## 2015-01-21 LAB — BASIC METABOLIC PANEL
ANION GAP: 9 (ref 5–15)
BUN: 26 mg/dL — ABNORMAL HIGH (ref 6–20)
CALCIUM: 8.9 mg/dL (ref 8.9–10.3)
CO2: 23 mmol/L (ref 22–32)
CREATININE: 2.39 mg/dL — AB (ref 0.61–1.24)
Chloride: 101 mmol/L (ref 101–111)
GFR, EST AFRICAN AMERICAN: 37 mL/min — AB (ref >60)
GFR, EST NON AFRICAN AMERICAN: 32 mL/min — AB (ref >60)
Glucose, Bld: 108 mg/dL — ABNORMAL HIGH (ref 65–99)
Potassium: 4.2 mmol/L (ref 3.5–5.1)
SODIUM: 133 mmol/L — AB (ref 135–145)

## 2015-01-21 LAB — CBC
HEMATOCRIT: 39.4 % (ref 39.0–52.0)
Hemoglobin: 13.6 g/dL (ref 13.0–17.0)
MCH: 29.2 pg (ref 26.0–34.0)
MCHC: 34.5 g/dL (ref 30.0–36.0)
MCV: 84.5 fL (ref 78.0–100.0)
PLATELETS: 179 10*3/uL (ref 150–400)
RBC: 4.66 MIL/uL (ref 4.22–5.81)
RDW: 13.1 % (ref 11.5–15.5)
WBC: 10.9 10*3/uL — AB (ref 4.0–10.5)

## 2015-01-21 MED ORDER — VANCOMYCIN HCL 10 G IV SOLR
1250.0000 mg | INTRAVENOUS | Status: DC
  Administered 2015-01-22 – 2015-01-23 (×2): 1250 mg via INTRAVENOUS
  Filled 2015-01-21 (×2): qty 1250

## 2015-01-21 MED ORDER — LEVOFLOXACIN IN D5W 750 MG/150ML IV SOLN: 750.0000 mg | INTRAVENOUS | Status: DC

## 2015-01-21 NOTE — Progress Notes (Signed)
Patient ID: [name], male   DOB: 1976-02-06, 39 y.o.   MRN: [id number]    Referring Physician(s): Eskridge  Chief Complaint:  Right hydronephrosis, ureteral stone  Subjective:  Pt doing ok; eating lunch; sore at PCN site rt flank; denies N/V; had temp elevation postprocedure, currently afebrile  Allergies: Review of patient's allergies indicates no known allergies.  Medications: Prior to Admission medications   Medication Sig Start Date End Date Taking? Authorizing Provider  albuterol (PROVENTIL HFA;VENTOLIN HFA) 108 (90 BASE) MCG/ACT inhaler Inhale 2 puffs into the lungs every 6 (six) hours as needed for wheezing. 01/11/14  Yes Kermit Balo Tysinger, PA-C  ciprofloxacin (CIPRO) 500 MG tablet Take 1 tablet (500 mg total) by mouth 2 (two) times daily. Patient taking differently: Take 500 mg by mouth 2 (two) times daily. Started 10/17 for 7 days 01/19/15  Yes Samantha Tripp Dowless, PA-C  mometasone (ASMANEX 30 METERED DOSES) 220 MCG/INH inhaler Inhale 1 puff into the lungs 2 (two) times daily. Patient taking differently: Inhale 1 puff into the lungs 2 (two) times daily as needed (for shortness of breath and wheezing).  01/11/14  Yes Kermit Balo Tysinger, PA-C  oxyCODONE-acetaminophen (PERCOCET/ROXICET) 5-325 MG tablet Take 2 tablets by mouth every 4 (four) hours as needed for severe pain. 01/19/15  Yes Samantha Tripp Dowless, PA-C  ciprofloxacin (CIPRO) 500 MG tablet Take 1 tablet (500 mg total) by mouth 2 (two) times daily. Patient not taking: Reported on 01/20/2015 06/26/14   Kermit Balo Tysinger, PA-C  HYDROcodone-acetaminophen (NORCO/VICODIN) 5-325 MG per tablet Take 1 tablet by mouth every 6 (six) hours as needed for moderate pain. Patient not taking: Reported on 01/20/2015 06/26/14   Kermit Balo Tysinger, PA-C     Vital Signs: BP 105/68 mmHg  Pulse 83  Temp(Src) 97.5 F (36.4 C) (Oral)  Resp 19  Ht 6' (1.829 m)  Wt 164 lb 4 oz (74.503 kg)  BMI 22.27 kg/m2  SpO2 99%  Physical Exam  rt PCN intact, dressing dry, site mild-mod tender, output 300 cc yellow urine; cx's pend  Imaging: Ct Renal Stone Study  01/19/2015  CLINICAL DATA:  Right flank pain for 2 weeks with nausea and vomiting. Difficulty urinating. History of sarcoidosis. EXAM: CT ABDOMEN AND PELVIS WITHOUT CONTRAST TECHNIQUE: Multidetector CT imaging of the abdomen and pelvis was performed following the standard protocol without IV contrast. COMPARISON:  CT abdomen and pelvis 01/04/2012. FINDINGS: Multiple small nodules in the lung bases, more conspicuous on the right, are unchanged and compatible with the patient's history sarcoidosis. The lung bases are otherwise clear. No pleural or pericardial effusion. The patient has severe right hydronephrosis due to a 1.2 cm AP by 0.9 cm transverse by 1.6 cm craniocaudal proximal ureteral stone. The stone is just below the ureteropelvic junction. 2 additional nonobstructing stones in the right kidney each measure approximately 0.4 cm. 3 nonobstructing left renal stones are also seen. The largest is in the midpole measuring 1 cm in diameter. No other urinary tract stones are identified. The spleen measures approximately 19.5 cm craniocaudal, not notably changed. The gallbladder, liver, adrenal glands, pancreas and biliary tree are unremarkable. A few small retroperitoneal lymph nodes are unchanged. No pathologically enlarged nodes by CT size criteria are seen. There is no fluid collection. The stomach and small and large bowel are unremarkable. No focal bony abnormality is seen. IMPRESSION: Severe right hydronephrosis due to a large proximal right ureteral stone. Additional nonobstructing bilateral renal stones are seen as described above. Micronodularity in the lung  bases bilaterally is unchanged and likely related to sarcoidosis. Unchanged splenomegaly. Electronically Signed   By: Drusilla Kannerhomas  Dalessio M.D.   On: 01/19/2015 19:07   Ir Nephrostomy Placement Right  01/20/2015  CLINICAL DATA:   Obstructing right UPJ 16 mm calculus, flank pain, fever EXAM: 1. ULTRASOUND GUIDANCE FOR PUNCTURE OF THE RIGHT RENAL COLLECTING SYSTEM. 2. RIGHT PERCUTANEOUS NEPHROSTOMY TUBE PLACEMENT. COMPARISON:  None. ANESTHESIA/SEDATION: 3.0 mg IV Versed; 100 mcg IV Fentanyl. Total Moderate Sedation Time Fifteen minutes CONTRAST:  10 cc ml Omnipaque 300 MEDICATIONS: 1 g vancomycin. Antibiotic was administered in an appropriate time frame prior to skin puncture. FLUOROSCOPY TIME:  1 minutes 48 seconds, 15 mGy PROCEDURE: The procedure, risks, benefits, and alternatives were explained to the patient. Questions regarding the procedure were encouraged and answered. The patient understands and consents to the procedure. The right flank region was prepped with Betadine in a sterile fashion, and a sterile drape was applied covering the operative field. A sterile gown and sterile gloves were used for the procedure. Local anesthesia was provided with 1% Lidocaine. Ultrasound was used to localize the right kidney. Under direct ultrasound guidance, a 21 gauge needle was advanced into the renal collecting system. Ultrasound image documentation was performed. Aspiration of urine sample was performed followed by contrast injection. A transitional dilator was advanced over a guidewire. Percutaneous tract dilatation was then performed over the guidewire. A 10 -French percutaneous nephrostomy tube was then advanced and formed in the collecting system. Catheter position was confirmed by fluoroscopy after contrast injection. The catheter was secured at the skin with a Prolene retention suture and Stat-Lock device. A gravity bag was placed. COMPLICATIONS: None. FINDINGS: Imaging confirms percutaneous needle access of a posterior mid to lower pole calyx. 10 French nephrostomy catheter advanced and formed in the renal pelvis without difficulty. Obstructing UPJ calculus present appear IMPRESSION: Successful ultrasound and fluoroscopic 10 French right  nephrostomy insertion Electronically Signed   By: Judie PetitM.  Shick M.D.   On: 01/20/2015 16:34    Labs:  CBC:  Recent Labs  06/26/14 0001 01/19/15 1803 01/21/15 0510  WBC 4.4 4.3 10.9*  HGB 13.5 13.1 13.6  HCT 39.6 39.1 39.4  PLT 274 206 179    COAGS:  Recent Labs  01/20/15 1440  INR 1.09  APTT 47*    BMP:  Recent Labs  06/26/14 0001 01/19/15 1803 01/21/15 0510  NA 138 135 133*  K 4.8 3.7 4.2  CL 103 99* 101  CO2 27 27 23   GLUCOSE 93 121* 108*  BUN 15 14 26*  CALCIUM 9.6 9.5 8.9  CREATININE 1.18 1.65* 2.39*  GFRNONAA  --  50* 32*  GFRAA  --  58* 37*    LIVER FUNCTION TESTS:  Recent Labs  06/26/14 0001  BILITOT 0.8  AST 22  ALT 14  ALKPHOS 100  PROT 7.9  ALBUMIN 4.1    Assessment and Plan: Pt s/p right PCN 10/17 secondary to right hydronephrosis/obstructing UPJ stone; check final urine cx's /sens and blood cx's; hydrate; creat today 2.39(1.65  ) , WBC 10.9, hgb 13.6; other plans as per urology   Signed: D. Jeananne RamaKevin Marzell Isakson 01/21/2015, 1:38 PM   I spent a total of 15 minutes at the the patient's bedside AND on the patient's hospital floor or unit, greater than 50% of which was counseling/coordinating care for right perc nephrostomy

## 2015-01-21 NOTE — Progress Notes (Addendum)
  Subjective: The patient is doing well.  No complaints except mild discomfort from neph tube.  Febrile throughout night but AF this morning.  Voiding well with large volume per pt.  I/O recorded by RN in chart is from neph tube only.  Cr up to 2.39 from 1.65 01/19/15 and WBC up to 10.9 from 4.3 01/19/15.  Cultures are pending.  Pt currently on Vanc and Levaquin.   Objective: Vital signs in last 24 hours: Temp:  [97.5 F (36.4 C)-102.5 F (39.2 C)] 97.5 F (36.4 C) (10/18 0420) Pulse Rate:  [83-118] 83 (10/18 0420) Resp:  [16-23] 19 (10/18 0420) BP: (105-166)/(63-118) 105/68 mmHg (10/18 0420) SpO2:  [93 %-100 %] 99 % (10/18 0420) Weight:  [74.503 kg (164 lb 4 oz)] 74.503 kg (164 lb 4 oz) (10/17 1323)  Intake/Output from previous day: 10/17 0701 - 10/18 0700 In: 3066.3 [P.O.:960; I.V.:1806.3; IV Piggyback:300] Out: 610 [Urine:610] Intake/Output this shift:    Physical Exam:  General: Alert and oriented. Abdomen: Soft, Nondistended. Neph tube: dressing C/D/I; urine clear/yellow  Lab Results:  Recent Labs  01/19/15 1803 01/21/15 0510  HGB 13.1 13.6  HCT 39.1 39.4   Lab Results  Component Value Date   CREATININE 2.39* 01/21/2015    Assessment/Plan: Large right ureteral stone with hydro and UTI:  1.) Neph tube placed yesterday and draining well  2.) Cr up today--monitor closely.  Continue IVF.  Pharm adjusting Abx accordingly   3.) WBC up today--f/u cultures; continue IV ABx   4.) Pt will need PCNL at later date for stone tx  5.) Ambulate; SCDs      DANCY, AMANDA 01/21/2015, 11:14 AM  Patient seen and examined. I discussed patient with PA Dancy and agree with her assessment and plan. He is feeling better. Taking good PO. I d/c'd IVF. URINE Cx growing staph again - sensitivities pending. CBC and BMP pending for AM.

## 2015-01-21 NOTE — Progress Notes (Signed)
ANTIBIOTIC CONSULT NOTE - Follow-up  Pharmacy Consult for vanco/levofloxacin Indication: UTI  No Known Allergies  Patient Measurements: Height: 6' (182.9 cm) Weight: 164 lb 4 oz (74.503 kg) IBW/kg (Calculated) : 77.6 Adjusted Body Weight:   Vital Signs: Temp: 97.5 F (36.4 C) (10/18 0420) Temp Source: Oral (10/18 0420) BP: 105/68 mmHg (10/18 0420) Pulse Rate: 83 (10/18 0420) Intake/Output from previous day: 10/17 0701 - 10/18 0700 In: 3066.3 [P.O.:960; I.V.:1806.3; IV Piggyback:300] Out: 610 [Urine:610] Intake/Output from this shift:    Labs:  Recent Labs  01/19/15 1803 01/21/15 0510  WBC 4.3 10.9*  HGB 13.1 13.6  PLT 206 179  CREATININE 1.65* 2.39*   Estimated Creatinine Clearance: 42.9 mL/min (by C-G formula based on Cr of 2.39). No results for input(s): VANCOTROUGH, VANCOPEAK, VANCORANDOM, GENTTROUGH, GENTPEAK, GENTRANDOM, TOBRATROUGH, TOBRAPEAK, TOBRARND, AMIKACINPEAK, AMIKACINTROU, AMIKACIN in the last 72 hours.   Microbiology: Recent Results (from the past 720 hour(s))  Urine culture     Status: None (Preliminary result)   Collection Time: 01/19/15  5:50 PM  Result Value Ref Range Status   Specimen Description URINE, RANDOM  Final   Special Requests NONE  Final   Culture CULTURE REINCUBATED FOR BETTER GROWTH  Final   Report Status PENDING  Incomplete  Culture, Urine     Status: None (Preliminary result)   Collection Time: 01/20/15 10:55 PM  Result Value Ref Range Status   Specimen Description   Final    URINE, CATHETERIZED NEPHROSTOMY Performed at [facility]     Special Requests NONE  Final   Culture PENDING  Incomplete   Report Status PENDING  Incomplete    Medical History: Assessment: [age] YOM with history of kidney stone and nephrostomy tube, presents with flank pain with stone on CT.  Has h/o of Oxacillin-sensitive CoNS in urine culture (01/2014)  10/16 urine: re-incubated 10/17 urine: pending 10/17 blood: pending  Renal: Scr was  elevated on 10/16 labs, trending up (s/p R PCN tube placement by IR 10/17) WBC slightly elevated Tm 102.5  Goal of Therapy:  Vancomycin trough level 10-15 mcg/ml  Plan:  Day #2 antibiotics Based on worsening SCr, adjust antibiotics as follows  Vancomycin 750mg  IV q12h to 1250mg  IV q24h  Watch renal function  levofloxaxin 750mg  IV q24h to q48h  Await culture results  Juliette Alcideustin Amear Strojny, PharmD, BCPS.   Pager: [phone] 01/21/2015,10:43 AM

## 2015-01-22 LAB — CBC
HEMATOCRIT: 34.6 % — AB (ref 39.0–52.0)
HEMOGLOBIN: 12 g/dL — AB (ref 13.0–17.0)
MCH: 29.2 pg (ref 26.0–34.0)
MCHC: 34.7 g/dL (ref 30.0–36.0)
MCV: 84.2 fL (ref 78.0–100.0)
Platelets: 171 10*3/uL (ref 150–400)
RBC: 4.11 MIL/uL — ABNORMAL LOW (ref 4.22–5.81)
RDW: 13.1 % (ref 11.5–15.5)
WBC: 7.1 10*3/uL (ref 4.0–10.5)

## 2015-01-22 LAB — BASIC METABOLIC PANEL
ANION GAP: 10 (ref 5–15)
BUN: 21 mg/dL — ABNORMAL HIGH (ref 6–20)
CO2: 22 mmol/L (ref 22–32)
Calcium: 8.9 mg/dL (ref 8.9–10.3)
Chloride: 101 mmol/L (ref 101–111)
Creatinine, Ser: 1.82 mg/dL — ABNORMAL HIGH (ref 0.61–1.24)
GFR calc Af Amer: 52 mL/min — ABNORMAL LOW (ref >60)
GFR calc non Af Amer: 45 mL/min — ABNORMAL LOW (ref >60)
GLUCOSE: 104 mg/dL — AB (ref 65–99)
POTASSIUM: 3.6 mmol/L (ref 3.5–5.1)
Sodium: 133 mmol/L — ABNORMAL LOW (ref 135–145)

## 2015-01-22 MED ORDER — LEVOFLOXACIN IN D5W 750 MG/150ML IV SOLN
750.0000 mg | INTRAVENOUS | Status: DC
  Administered 2015-01-22: 750 mg via INTRAVENOUS
  Filled 2015-01-22: qty 150

## 2015-01-22 MED ORDER — SODIUM CHLORIDE 0.9 % IV SOLN
INTRAVENOUS | Status: DC
  Administered 2015-01-22 – 2015-01-23 (×2): via INTRAVENOUS

## 2015-01-22 MED ORDER — LEVOFLOXACIN IN D5W 750 MG/150ML IV SOLN: 750.0000 mg | INTRAVENOUS | Status: DC

## 2015-01-22 MED ORDER — ACETAMINOPHEN 500 MG PO TABS
1000.0000 mg | ORAL_TABLET | Freq: Three times a day (TID) | ORAL | Status: DC | PRN
  Administered 2015-01-22: 1000 mg via ORAL
  Filled 2015-01-22: qty 2

## 2015-01-22 NOTE — Progress Notes (Signed)
ANTIBIOTIC CONSULT NOTE - Follow-up  Pharmacy Consult for vanco/levofloxacin Indication: UTI  No Known Allergies  Patient Measurements: Height: 6' (182.9 cm) Weight: 164 lb 4 oz (74.503 kg) IBW/kg (Calculated) : 77.6 Adjusted Body Weight:   Vital Signs: Temp: 98.6 F (37 C) (10/19 0600) Temp Source: Oral (10/19 0600) BP: 116/83 mmHg (10/19 0453) Pulse Rate: 101 (10/19 0453) Intake/Output from previous day: 10/18 0701 - 10/19 0700 In: 510 [P.O.:360; IV Piggyback:150] Out: 1340 [Urine:1340] Intake/Output from this shift: Total I/O In: 120 [P.O.:120] Out: 300 [Urine:300]  Labs:  Recent Labs  01/19/15 1803 01/21/15 0510 01/22/15 0544  WBC 4.3 10.9* 7.1  HGB 13.1 13.6 12.0*  PLT 206 179 171  CREATININE 1.65* 2.39* 1.82*   Estimated Creatinine Clearance: 56.3 mL/min (by C-G formula based on Cr of 1.82). No results for input(s): VANCOTROUGH, VANCOPEAK, VANCORANDOM, GENTTROUGH, GENTPEAK, GENTRANDOM, TOBRATROUGH, TOBRAPEAK, TOBRARND, AMIKACINPEAK, AMIKACINTROU, AMIKACIN in the last 72 hours.   Microbiology: Recent Results (from the past 720 hour(s))  Urine culture     Status: None (Preliminary result)   Collection Time: 01/19/15  5:50 PM  Result Value Ref Range Status   Specimen Description URINE, RANDOM  Final   Special Requests NONE  Final   Culture   Final    >=100,000 COLONIES/mL STAPHYLOCOCCUS SPECIES (COAGULASE NEGATIVE) CULTURE REINCUBATED FOR BETTER GROWTH    Report Status PENDING  Incomplete  Culture, Urine     Status: None (Preliminary result)   Collection Time: 01/20/15 10:55 PM  Result Value Ref Range Status   Specimen Description   Final    URINE, CATHETERIZED NEPHROSTOMY Performed at [facility]    Special Requests NONE  Final   Culture PENDING  Incomplete   Report Status PENDING  Incomplete   Assessment: [age] YOM with history of kidney stone and nephrostomy tube, presented to [facility] on 10/16 with flank pain with stone noted on CT.  He also  has h/o of Oxacillin-sensitive CoNS in urine culture (01/2014)  S/p R PCN tube placement by IR 10/17)  10/17 urine: pending 10/17 blood: pending  Today, 01/22/2015: - day #3 abx - Tmax 101, wbc down wnl - scr down 1.82 (crcl~56), UOP 0.7  Goal of Therapy:  Vancomycin trough level 10-15 mcg/ml  Plan:   Cont Vancomycin 1250mg  q24h  Change levofloxaxin 750mg  IV q24h  BMP in AM  F/u renal function and cultures

## 2015-01-22 NOTE — Progress Notes (Signed)
Patient ID: [name], male   DOB: January 10, 1976, 39 y.o.   MRN: [id number]    Referring Physician(s): [place]  Chief Complaint:  Right hydronephrosis/ureteral stone  Subjective:  Pt doing ok; had few chills/temp elevation earlier this am, currently afebrile; still with some rt flank soreness; denies N/V; ate breakfast; voiding ok  Allergies: Review of patient's allergies indicates no known allergies.  Medications: Prior to Admission medications   Medication Sig Start Date End Date Taking? Authorizing Provider  albuterol (PROVENTIL HFA;VENTOLIN HFA) 108 (90 BASE) MCG/ACT inhaler Inhale 2 puffs into the lungs every 6 (six) hours as needed for wheezing. 01/11/14  Yes Kermit Balo Tysinger, PA-C  ciprofloxacin (CIPRO) 500 MG tablet Take 1 tablet (500 mg total) by mouth 2 (two) times daily. Patient taking differently: Take 500 mg by mouth 2 (two) times daily. Started 10/17 for 7 days 01/19/15  Yes Samantha Tripp Dowless, PA-C  mometasone (ASMANEX 30 METERED DOSES) 220 MCG/INH inhaler Inhale 1 puff into the lungs 2 (two) times daily. Patient taking differently: Inhale 1 puff into the lungs 2 (two) times daily as needed (for shortness of breath and wheezing).  01/11/14  Yes Kermit Balo Tysinger, PA-C  oxyCODONE-acetaminophen (PERCOCET/ROXICET) 5-325 MG tablet Take 2 tablets by mouth every 4 (four) hours as needed for severe pain. 01/19/15  Yes Samantha Tripp Dowless, PA-C  ciprofloxacin (CIPRO) 500 MG tablet Take 1 tablet (500 mg total) by mouth 2 (two) times daily. Patient not taking: Reported on 01/20/2015 06/26/14   Kermit Balo Tysinger, PA-C  HYDROcodone-acetaminophen (NORCO/VICODIN) 5-325 MG per tablet Take 1 tablet by mouth every 6 (six) hours as needed for moderate pain. Patient not taking: Reported on 01/20/2015 06/26/14   Kermit Balo Tysinger, PA-C     Vital Signs: BP 116/83 mmHg  Pulse 101  Temp(Src) 98.6 F (37 C) (Oral)  Resp 19  Ht 6' (1.829 m)  Wt 164 lb 4 oz (74.503 kg)  BMI 22.27  kg/m2  SpO2 96%  Physical Exam awake/alert; rt PCN intact, dressing dry, site mildly tender; output 300cc recorded with 175 cc in bag now yellow urine; cath flushes ok; cx's pend  Imaging: Ct Renal Stone Study  01/19/2015  CLINICAL DATA:  Right flank pain for 2 weeks with nausea and vomiting. Difficulty urinating. History of sarcoidosis. EXAM: CT ABDOMEN AND PELVIS WITHOUT CONTRAST TECHNIQUE: Multidetector CT imaging of the abdomen and pelvis was performed following the standard protocol without IV contrast. COMPARISON:  CT abdomen and pelvis 01/04/2012. FINDINGS: Multiple small nodules in the lung bases, more conspicuous on the right, are unchanged and compatible with the patient's history sarcoidosis. The lung bases are otherwise clear. No pleural or pericardial effusion. The patient has severe right hydronephrosis due to a 1.2 cm AP by 0.9 cm transverse by 1.6 cm craniocaudal proximal ureteral stone. The stone is just below the ureteropelvic junction. 2 additional nonobstructing stones in the right kidney each measure approximately 0.4 cm. 3 nonobstructing left renal stones are also seen. The largest is in the midpole measuring 1 cm in diameter. No other urinary tract stones are identified. The spleen measures approximately 19.5 cm craniocaudal, not notably changed. The gallbladder, liver, adrenal glands, pancreas and biliary tree are unremarkable. A few small retroperitoneal lymph nodes are unchanged. No pathologically enlarged nodes by CT size criteria are seen. There is no fluid collection. The stomach and small and large bowel are unremarkable. No focal bony abnormality is seen. IMPRESSION: Severe right hydronephrosis due to a large proximal right ureteral stone.  Additional nonobstructing bilateral renal stones are seen as described above. Micronodularity in the lung bases bilaterally is unchanged and likely related to sarcoidosis. Unchanged splenomegaly. Electronically Signed   By: Drusilla Kannerhomas  Dalessio  M.D.   On: 01/19/2015 19:07   Ir Nephrostomy Placement Right  01/20/2015  CLINICAL DATA:  Obstructing right UPJ 16 mm calculus, flank pain, fever EXAM: 1. ULTRASOUND GUIDANCE FOR PUNCTURE OF THE RIGHT RENAL COLLECTING SYSTEM. 2. RIGHT PERCUTANEOUS NEPHROSTOMY TUBE PLACEMENT. COMPARISON:  None. ANESTHESIA/SEDATION: 3.0 mg IV Versed; 100 mcg IV Fentanyl. Total Moderate Sedation Time Fifteen minutes CONTRAST:  10 cc ml Omnipaque 300 MEDICATIONS: 1 g vancomycin. Antibiotic was administered in an appropriate time frame prior to skin puncture. FLUOROSCOPY TIME:  1 minutes 48 seconds, 15 mGy PROCEDURE: The procedure, risks, benefits, and alternatives were explained to the patient. Questions regarding the procedure were encouraged and answered. The patient understands and consents to the procedure. The right flank region was prepped with Betadine in a sterile fashion, and a sterile drape was applied covering the operative field. A sterile gown and sterile gloves were used for the procedure. Local anesthesia was provided with 1% Lidocaine. Ultrasound was used to localize the right kidney. Under direct ultrasound guidance, a 21 gauge needle was advanced into the renal collecting system. Ultrasound image documentation was performed. Aspiration of urine sample was performed followed by contrast injection. A transitional dilator was advanced over a guidewire. Percutaneous tract dilatation was then performed over the guidewire. A 10 -French percutaneous nephrostomy tube was then advanced and formed in the collecting system. Catheter position was confirmed by fluoroscopy after contrast injection. The catheter was secured at the skin with a Prolene retention suture and Stat-Lock device. A gravity bag was placed. COMPLICATIONS: None. FINDINGS: Imaging confirms percutaneous needle access of a posterior mid to lower pole calyx. 10 French nephrostomy catheter advanced and formed in the renal pelvis without difficulty. Obstructing  UPJ calculus present appear IMPRESSION: Successful ultrasound and fluoroscopic 10 French right nephrostomy insertion Electronically Signed   By: Judie PetitM.  Shick M.D.   On: 01/20/2015 16:34    Labs:  CBC:  Recent Labs  06/26/14 0001 01/19/15 1803 01/21/15 0510 01/22/15 0544  WBC 4.4 4.3 10.9* 7.1  HGB 13.5 13.1 13.6 12.0*  HCT 39.6 39.1 39.4 34.6*  PLT 274 206 179 171    COAGS:  Recent Labs  01/20/15 1440  INR 1.09  APTT 47*    BMP:  Recent Labs  06/26/14 0001 01/19/15 1803 01/21/15 0510 01/22/15 0544  NA 138 135 133* 133*  K 4.8 3.7 4.2 3.6  CL 103 99* 101 101  CO2 27 27 23 22   GLUCOSE 93 121* 108* 104*  BUN 15 14 26* 21*  CALCIUM 9.6 9.5 8.9 8.9  CREATININE 1.18 1.65* 2.39* 1.82*  GFRNONAA  --  50* 32* 45*  GFRAA  --  58* 37* 52*    LIVER FUNCTION TESTS:  Recent Labs  06/26/14 0001  BILITOT 0.8  AST 22  ALT 14  ALKPHOS 100  PROT 7.9  ALBUMIN 4.1    Assessment and Plan: Pt s/p right PCN 10/17 secondary to right hydronephrosis/obstructing UPJ stone; check final urine cx's /sens and blood cx's; hydrate;creat down to 1.82 (2.39), WBC nl; hgb 12.0; stone management per urology.   Signed: D. Jeananne RamaKevin Dovie Kapusta 01/22/2015, 10:06 AM   I spent a total of 15 minutes at the the patient's bedside AND on the patient's hospital floor or unit, greater than 50% of which was counseling/coordinating care  for right perc nephrostomy

## 2015-01-22 NOTE — Progress Notes (Signed)
Subjective:  1 - Large Right Renal Stone - s/p right nephrostomy 01/20/15 for large UPJ stone with hydro in setting of UTI/urosepsis.   2 - Urosepsis - UCX 10/17 staph (has had previously), BCX pending. Presented with fevers, tachycardia, pyuria + bacteruria c/w urosepsis. Now on levaquin + vanc per pharmacy consult.  3 - Acute Renal Failure - Baseline Cr <1.5. Cr rise ton 2.39 10/17 without oliguria or hyperkalemia. On Vanc + Levaquin and some tachycardia.  Today "[name]" is w/o complaints. Still with sawtooth fever curve, Tm peaks seem to be trending down.  Objective: Vital signs in last 24 hours: Temp:  [98.6 F (37 C)-101 F (38.3 C)] 98.6 F (37 C) (10/19 0600) Pulse Rate:  [95-101] 101 (10/19 0453) Resp:  [18-20] 19 (10/19 0453) BP: (108-141)/(72-87) 116/83 mmHg (10/19 0453) SpO2:  [96 %-99 %] 96 % (10/19 0858) Last BM Date: 01/17/15  Intake/Output from previous day: 10/18 0701 - 10/19 0700 In: 510 [P.O.:360; IV Piggyback:150] Out: 1340 [Urine:1340] Intake/Output this shift: Total I/O In: 120 [P.O.:120] Out: 300 [Urine:300]  General appearance: alert, cooperative, appears stated age and at functional baseline Head: Normocephalic, without obvious abnormality, atraumatic Nose: Nares normal. Septum midline. Mucosa normal. No drainage or sinus tenderness. Throat: lips, mucosa, and tongue normal; teeth and gums normal Neck: supple, symmetrical, trachea midline Back: symmetric, no curvature. ROM normal. No CVA tenderness. Resp: non-labored on room air Cardio: mild regular tachycardia.  GI: soft, non-tender; bowel sounds normal; no masses,  no organomegaly Extremities: extremities normal, atraumatic, no cyanosis or edema Pulses: 2+ and symmetric Skin: Skin color, texture, turgor normal. No rashes or lesions Lymph nodes: Cervical, supraclavicular, and axillary nodes normal. Neurologic: Grossly normal Incision/Wound: Rt neph tube c/di with clear urine.   Lab Results:    Recent Labs  01/21/15 0510 01/22/15 0544  WBC 10.9* 7.1  HGB 13.6 12.0*  HCT 39.4 34.6*  PLT 179 171   BMET  Recent Labs  01/21/15 0510 01/22/15 0544  NA 133* 133*  K 4.2 3.6  CL 101 101  CO2 23 22  GLUCOSE 108* 104*  BUN 26* 21*  CREATININE 2.39* 1.82*  CALCIUM 8.9 8.9   PT/INR  Recent Labs  01/20/15 1440  LABPROT 14.3  INR 1.09   ABG No results for input(s): PHART, HCO3 in the last 72 hours.  Invalid input(s): PCO2, PO2  Studies/Results: Ir Nephrostomy Placement Right  01/20/2015  CLINICAL DATA:  Obstructing right UPJ 16 mm calculus, flank pain, fever EXAM: 1. ULTRASOUND GUIDANCE FOR PUNCTURE OF THE RIGHT RENAL COLLECTING SYSTEM. 2. RIGHT PERCUTANEOUS NEPHROSTOMY TUBE PLACEMENT. COMPARISON:  None. ANESTHESIA/SEDATION: 3.0 mg IV Versed; 100 mcg IV Fentanyl. Total Moderate Sedation Time Fifteen minutes CONTRAST:  10 cc ml Omnipaque 300 MEDICATIONS: 1 g vancomycin. Antibiotic was administered in an appropriate time frame prior to skin puncture. FLUOROSCOPY TIME:  1 minutes 48 seconds, 15 mGy PROCEDURE: The procedure, risks, benefits, and alternatives were explained to the patient. Questions regarding the procedure were encouraged and answered. The patient understands and consents to the procedure. The right flank region was prepped with Betadine in a sterile fashion, and a sterile drape was applied covering the operative field. A sterile gown and sterile gloves were used for the procedure. Local anesthesia was provided with 1% Lidocaine. Ultrasound was used to localize the right kidney. Under direct ultrasound guidance, a 21 gauge needle was advanced into the renal collecting system. Ultrasound image documentation was performed. Aspiration of urine sample was performed followed by contrast injection.  A transitional dilator was advanced over a guidewire. Percutaneous tract dilatation was then performed over the guidewire. A 10 -French percutaneous nephrostomy tube was then  advanced and formed in the collecting system. Catheter position was confirmed by fluoroscopy after contrast injection. The catheter was secured at the skin with a Prolene retention suture and Stat-Lock device. A gravity bag was placed. COMPLICATIONS: None. FINDINGS: Imaging confirms percutaneous needle access of a posterior mid to lower pole calyx. 10 French nephrostomy catheter advanced and formed in the renal pelvis without difficulty. Obstructing UPJ calculus present appear IMPRESSION: Successful ultrasound and fluoroscopic 10 French right nephrostomy insertion Electronically Signed   By: Judie Petit.  Shick M.D.   On: 01/20/2015 16:34    Anti-infectives: Anti-infectives    Start     Dose/Rate Route Frequency Ordered Stop   01/22/15 1600  levofloxacin (LEVAQUIN) IVPB 750 mg  Status:  Discontinued     750 mg 100 mL/hr over 90 Minutes Intravenous Every 48 hours 01/21/15 1047 01/22/15 1020   01/22/15 1100  levofloxacin (LEVAQUIN) IVPB 750 mg  Status:  Discontinued     750 mg 100 mL/hr over 90 Minutes Intravenous Every 48 hours 01/22/15 1020 01/22/15 1022   01/22/15 1100  levofloxacin (LEVAQUIN) IVPB 750 mg     750 mg 100 mL/hr over 90 Minutes Intravenous Every 24 hours 01/22/15 1022     01/22/15 0600  vancomycin (VANCOCIN) 1,250 mg in sodium chloride 0.9 % 250 mL IVPB     1,250 mg 166.7 mL/hr over 90 Minutes Intravenous Every 24 hours 01/21/15 1047     01/20/15 2200  vancomycin (VANCOCIN) IVPB 750 mg/150 ml premix  Status:  Discontinued     750 mg 150 mL/hr over 60 Minutes Intravenous Every 12 hours 01/20/15 1352 01/21/15 1047   01/20/15 1600  levofloxacin (LEVAQUIN) IVPB 750 mg  Status:  Discontinued     750 mg 100 mL/hr over 90 Minutes Intravenous Every 24 hours 01/20/15 1352 01/21/15 1047   01/20/15 1500  vancomycin (VANCOCIN) IVPB 750 mg/150 ml premix     750 mg 150 mL/hr over 60 Minutes Intravenous  Once 01/20/15 1352 01/20/15 1525      Assessment/Plan:  1 - Large Right Renal Stone - s/p  right nephrostomy. Will need right PCNL, likely single stage in elective setting, not during this hospitalization as he must completely clear infectious parameters first.   2 - Urosepsis - appreciate pharmacy assistance. Continue current regiemn pending any furhter CX / sensitivity data.   3 - Acute Renal Failure - Likely multifactorial with some pre-hospitalization obstruction as well as some pre-renal dehydration / mild hypotension. Improving. Continue 1/2 MIVF.  4 - Remain in house until [facility] final and afebrile x 24 hrs.   [facility], [name] 01/22/2015

## 2015-01-23 ENCOUNTER — Other Ambulatory Visit: Payer: Self-pay | Admitting: Urology

## 2015-01-23 LAB — BASIC METABOLIC PANEL
ANION GAP: 7 (ref 5–15)
BUN: 18 mg/dL (ref 6–20)
CALCIUM: 8.9 mg/dL (ref 8.9–10.3)
CO2: 23 mmol/L (ref 22–32)
Chloride: 106 mmol/L (ref 101–111)
Creatinine, Ser: 1.44 mg/dL — ABNORMAL HIGH (ref 0.61–1.24)
GFR calc Af Amer: >60 mL/min (ref >60)
GFR calc non Af Amer: 59 mL/min — ABNORMAL LOW (ref >60)
GLUCOSE: 101 mg/dL — AB (ref 65–99)
POTASSIUM: 3.6 mmol/L (ref 3.5–5.1)
Sodium: 136 mmol/L (ref 135–145)

## 2015-01-23 MED ORDER — SULFAMETHOXAZOLE-TRIMETHOPRIM 800-160 MG PO TABS: 1.0000 | ORAL_TABLET | Freq: Two times a day (BID) | ORAL | Status: DC

## 2015-01-23 MED ORDER — SENNOSIDES-DOCUSATE SODIUM 8.6-50 MG PO TABS: 1.0000 | ORAL_TABLET | Freq: Two times a day (BID) | ORAL | Status: DC

## 2015-01-23 MED ORDER — TRAMADOL HCL 50 MG PO TABS: 50.0000 mg | ORAL_TABLET | Freq: Four times a day (QID) | ORAL | Status: DC | PRN

## 2015-01-23 NOTE — Progress Notes (Signed)
Patient discharged to home with family, discharge instructions reviewed with patient who verbalized understanding. New RX given to patient. 

## 2015-01-23 NOTE — Discharge Summary (Signed)
Physician Discharge Summary  Patient ID: [name] MRN: [id number] DOB/AGE: [birth year] 39 y.o.  Admit date: 01/20/2015 Discharge date: 01/23/2015  Admission Diagnoses: Large right renal pelvis stone  Discharge Diagnoses:  Active Problems:   Right ureteral stone   Hydronephrosis of right kidney   Hydronephrosis, right   Nephrolithiasis Acute renal failure (resolved at discharge)  Discharged Condition: good  Hospital Course:   1 - Large Right Renal Stone - s/p right nephrostomy 01/20/15 for large UPJ stone with hydro in setting of UTI/urosepsis. He will require right percutaneous nephrostolithotomy in elective setting for definitive management.   2 - Urosepsis - UCX 10/17 staph (has had previously), BCX pending / no growth to date at discharge. Presented with fevers, tachycardia, pyuria + bacteruria c/w urosepsis. Treated in house with levaquin + vanc per pharmacy consult. Afebtrile x 24 hours by time of discharge.   3 - Acute Renal Failure - Baseline Cr <1.5. Cr rise ton 2.39 10/17 without oliguria or hyperkalemia. On Vanc + Levaquin and some tachycardia. Nearly resolved by discharge with Cr 1.44 on day of discharge.    Consults: pharmacy, interventional radiology  Significant Diagnostic Studies: labs: as per above  Treatments: IV hydration and right nephrostomy  Discharge Exam: Blood pressure 117/76, pulse 78, temperature 98.3 F (36.8 C), temperature source Oral, resp. rate 20, height 6' (1.829 m), weight 74.503 kg (164 lb 4 oz), SpO2 98 %. General appearance: alert, cooperative, appears stated age and girlfriend at bedside Head: Normocephalic, without obvious abnormality, atraumatic Eyes: negative Nose: Nares normal. Septum midline. Mucosa normal. No drainage or sinus tenderness. Throat: lips, mucosa, and tongue normal; teeth and gums normal Neck: supple, symmetrical, trachea midline Back: symmetric, no curvature. ROM normal. No CVA tenderness., Rt neph tube site  c/d/i wtih clear yellow urine in applaicne.  Resp: non-labored Cardio: Nl rate GI: soft, non-tender; bowel sounds normal; no masses,  no organomegaly Male genitalia: normal Extremities: extremities normal, atraumatic, no cyanosis or edema Pulses: 2+ and symmetric Skin: Skin color, texture, turgor normal. No rashes or lesions Lymph nodes: Cervical, supraclavicular, and axillary nodes normal. Neurologic: Grossly normal  Disposition: 01-Home or Self Care     Medication List    STOP taking these medications        ciprofloxacin 500 MG tablet  Commonly known as:  CIPRO     HYDROcodone-acetaminophen 5-325 MG tablet  Commonly known as:  NORCO/VICODIN     oxyCODONE-acetaminophen 5-325 MG tablet  Commonly known as:  PERCOCET/ROXICET      TAKE these medications        albuterol 108 (90 BASE) MCG/ACT inhaler  Commonly known as:  PROVENTIL HFA;VENTOLIN HFA  Inhale 2 puffs into the lungs every 6 (six) hours as needed for wheezing.     mometasone 220 MCG/INH inhaler  Commonly known as:  ASMANEX 30 METERED DOSES  Inhale 1 puff into the lungs 2 (two) times daily.     senna-docusate 8.6-50 MG tablet  Commonly known as:  Senokot-S  Take 1 tablet by mouth 2 (two) times daily. While taking pain meds to prevent constipation.     sulfamethoxazole-trimethoprim 800-160 MG tablet  Commonly known as:  BACTRIM DS,SEPTRA DS  Take 1 tablet by mouth 2 (two) times daily. X 7 days now. Also begin 3 days before next Urology surgery     traMADol 50 MG tablet  Commonly known as:  ULTRAM  Take 1 tablet (50 mg total) by mouth every 6 (six) hours as needed for moderate pain.  From kidney stone           Follow-up Information    Follow up with [name].   Specialty:  Urology   Why:  We will call you to schedule next stone surgery   Contact information:   [address] [place] [address] [phone]       Signed: Sebastian AcheMANNY, Socorro Jones 01/23/2015, 7:27 AM

## 2015-01-24 LAB — URINE CULTURE
Culture: >=100000
Culture: 10000

## 2015-01-24 NOTE — Patient Instructions (Addendum)
[name]  01/24/2015   Your procedure is scheduled on: Thursday 01/30/15  [facility]  [facility] to 3rd floor to  Short Stay Center at 1100AM.  Call this number if you have problems the morning of surgery [phone]   Remember: ONLY 1 PERSON MAY GO WITH YOU TO SHORT STAY TO GET  READY MORNING OF YOUR SURGERY.  Do not eat food or drink liquids :After Midnight. You may have clear liquids until 7:00 AM morning of surgery.     Take these medicines the morning of surgery with A SIP OF WATER: albuterol inhaler if needed and bring inhaler, mometasone( asmanex ) inhaler if needed and bring inhaler, Oxycodone if needed              You may not have any metal on your body including hair pins and              piercings  Do not wear jewelry, make-up, lotions, powders or perfumes, deodorant             Do not wear nail polish.  Do not shave  48 hours prior to surgery.              Men may shave face and neck.   Do not bring valuables to the hospital. [facility] IS NOT             RESPONSIBLE   FOR VALUABLES.  Contacts, dentures or bridgework may not be worn into surgery.  Leave suitcase in the car. After surgery it may be brought to your room.     Patients discharged the day of surgery will not be allowed to drive home.  Name and phone number of your driver:  Special Instructions: N/A              Please read over the following fact sheets you were given: _____________________________________________________________________             [facility] Pre[facility] Before surgery, you can play an important role.  Because skin is not sterile, your skin needs to be as free of germs as possible.  You can reduce the number of germs on your skin by washing with CHG (chlorahexidine gluconate) soap before surgery.  CHG is an antiseptic cleaner which kills germs and bonds with the skin to continue killing germs even after  washing. Please DO NOT use if you have an allergy to CHG or antibacterial soaps.  If your skin becomes reddened/irritated stop using the CHG and inform your nurse when you arrive at Short Stay. Do not shave (including legs and underarms) for at least 48 hours prior to the first CHG shower.  You may shave your face/neck. Please follow these instructions carefully:  1.  Shower with CHG Soap the night before surgery and the  morning of Surgery.  2.  If you choose to wash your hair, wash your hair first as usual with your  normal  shampoo.  3.  After you shampoo, rinse your hair and body thoroughly to remove the  shampoo.                           4.  Use CHG as you would any other liquid soap.  You can apply chg directly  to the skin and wash  Gently with a scrungie or clean washcloth.  5.  Apply the CHG Soap to your body ONLY FROM THE NECK DOWN.   Do not use on face/ open                           Wound or open sores. Avoid contact with eyes, ears mouth and genitals (private parts).                       Wash face,  Genitals (private parts) with your normal soap.             6.  Wash thoroughly, paying special attention to the area where your surgery  will be performed.  7.  Thoroughly rinse your body with warm water from the neck down.  8.  DO NOT shower/wash with your normal soap after using and rinsing off  the CHG Soap.                9.  Pat yourself dry with a clean towel.            10.  Wear clean pajamas.            11.  Place clean sheets on your bed the night of your first shower and do not  sleep with pets. Day of Surgery : Do not apply any lotions/deodorants the morning of surgery.  Please wear clean clothes to the hospital/surgery center.  FAILURE TO FOLLOW THESE INSTRUCTIONS MAY RESULT IN THE CANCELLATION OF YOUR SURGERY PATIENT SIGNATURE_________________________________  NURSE  SIGNATURE__________________________________  ________________________________________________________________________    CLEAR LIQUID DIET   Foods Allowed                                                                     Foods Excluded  Coffee and tea, regular and decaf                             liquids that you cannot  Plain Jell-O in any flavor                                             see through such as: Fruit ices (not with fruit pulp)                                     milk, soups, orange juice  Iced Popsicles                                    All solid food Carbonated beverages, regular and diet                                    Cranberry, grape and apple juices Sports drinks like Gatorade Lightly seasoned clear broth or consume(fat free) Sugar, honey syrup  Sample Menu Breakfast                                Lunch                                     Supper Cranberry juice                    Beef broth                            Chicken broth Jell-O                                     Grape juice                           Apple juice Coffee or tea                        Jell-O                                      Popsicle                                                Coffee or tea                        Coffee or tea  _____________________________________________________________________

## 2015-01-25 ENCOUNTER — Telehealth (HOSPITAL_COMMUNITY): Payer: Self-pay

## 2015-01-25 NOTE — Telephone Encounter (Signed)
Post ED Visit - Positive Culture Follow-up  Culture report reviewed by antimicrobial stewardship pharmacist:  []  Celedonio MiyamotoJeremy Frens, Pharm.D., BCPS []  Georgina PillionElizabeth Martin, [address] BCPS []  Deer ParkMinh Pham, [address]Ph[address]D., BCPS, AAHIVP []  Estella HuskMichelle Turner, Pharm.D., BCPS, AAHIVP []  Colgate PalmoliveCristy Reyes, [address]Ph[address] [x]  Tennis Mustassie Stewart, Pharm.D.  Positive urine culture Treated with cipro, organism sensitive to the same and no further patient follow-up is required at this time.  Ashley JacobsFesterman, Maahi Lannan C 01/25/2015, 10:58 AM

## 2015-01-26 LAB — CULTURE, BLOOD (ROUTINE X 2)
CULTURE: NO GROWTH
Culture: NO GROWTH

## 2015-01-28 NOTE — Progress Notes (Signed)
ekg 01-20-15 epic bmet 01-23-15 epic Cbc 01-22-15 epic Pt, ptt 01-20-15 epic

## 2015-01-29 ENCOUNTER — Encounter (HOSPITAL_COMMUNITY)
Admission: RE | Admit: 2015-01-29 | Discharge: 2015-01-29 | Disposition: A | Discharge status: Home / Self Care | Payer: Commercial Managed Care - HMO | Source: Ambulatory Visit | Attending: Urology | Admitting: Urology

## 2015-01-29 ENCOUNTER — Encounter (HOSPITAL_COMMUNITY): Payer: Self-pay

## 2015-01-29 ENCOUNTER — Ambulatory Visit (HOSPITAL_COMMUNITY)
Admission: RE | Admit: 2015-01-29 | Discharge: 2015-01-29 | Disposition: A | Discharge status: Home / Self Care | Payer: Commercial Managed Care - HMO | Source: Ambulatory Visit | Attending: Anesthesiology | Admitting: Anesthesiology

## 2015-01-29 DIAGNOSIS — Z01811 Encounter for preprocedural respiratory examination: Secondary | ICD-10-CM

## 2015-01-30 ENCOUNTER — Inpatient Hospital Stay (HOSPITAL_COMMUNITY): Payer: Commercial Managed Care - HMO | Admitting: Registered Nurse

## 2015-01-30 ENCOUNTER — Encounter (HOSPITAL_COMMUNITY): Payer: Self-pay | Admitting: *Deleted

## 2015-01-30 ENCOUNTER — Inpatient Hospital Stay (HOSPITAL_COMMUNITY): Payer: Commercial Managed Care - HMO

## 2015-01-30 ENCOUNTER — Inpatient Hospital Stay (HOSPITAL_COMMUNITY)
Admission: RE | Admit: 2015-01-30 | Discharge: 2015-01-31 | DRG: 661 | Disposition: A | Discharge status: Home / Self Care | Payer: Commercial Managed Care - HMO | Source: Ambulatory Visit | Attending: Urology | Admitting: Urology

## 2015-01-30 ENCOUNTER — Encounter (HOSPITAL_COMMUNITY)
Admission: RE | Disposition: A | Discharge status: Home / Self Care | Payer: Self-pay | Source: Ambulatory Visit | Attending: Urology

## 2015-01-30 DIAGNOSIS — H9190 Unspecified hearing loss, unspecified ear: Secondary | ICD-10-CM | POA: Diagnosis present

## 2015-01-30 DIAGNOSIS — Z833 Family history of diabetes mellitus: Secondary | ICD-10-CM | POA: Diagnosis not present

## 2015-01-30 DIAGNOSIS — Z825 Family history of asthma and other chronic lower respiratory diseases: Secondary | ICD-10-CM

## 2015-01-30 DIAGNOSIS — Z809 Family history of malignant neoplasm, unspecified: Secondary | ICD-10-CM

## 2015-01-30 DIAGNOSIS — Z936 Other artificial openings of urinary tract status: Secondary | ICD-10-CM

## 2015-01-30 DIAGNOSIS — J45909 Unspecified asthma, uncomplicated: Secondary | ICD-10-CM | POA: Diagnosis present

## 2015-01-30 DIAGNOSIS — N200 Calculus of kidney: Secondary | ICD-10-CM

## 2015-01-30 DIAGNOSIS — D869 Sarcoidosis, unspecified: Secondary | ICD-10-CM | POA: Diagnosis present

## 2015-01-30 DIAGNOSIS — Z87891 Personal history of nicotine dependence: Secondary | ICD-10-CM | POA: Diagnosis not present

## 2015-01-30 DIAGNOSIS — N202 Calculus of kidney with calculus of ureter: Principal | ICD-10-CM | POA: Diagnosis present

## 2015-01-30 DIAGNOSIS — Z8249 Family history of ischemic heart disease and other diseases of the circulatory system: Secondary | ICD-10-CM | POA: Diagnosis not present

## 2015-01-30 HISTORY — PX: URETEROSCOPY: SHX842

## 2015-01-30 HISTORY — PX: NEPHROLITHOTOMY: SHX5134

## 2015-01-30 LAB — HEMOGLOBIN AND HEMATOCRIT, BLOOD
HCT: 37.3 % — ABNORMAL LOW (ref 39.0–52.0)
Hemoglobin: 12.8 g/dL — ABNORMAL LOW (ref 13.0–17.0)

## 2015-01-30 SURGERY — NEPHROLITHOTOMY PERCUTANEOUS
Anesthesia: General | Laterality: Right

## 2015-01-30 MED ORDER — FENTANYL CITRATE (PF) 100 MCG/2ML IJ SOLN
25.0000 ug | INTRAMUSCULAR | Status: DC | PRN
  Administered 2015-01-30: 50 ug via INTRAVENOUS

## 2015-01-30 MED ORDER — SUFENTANIL CITRATE 50 MCG/ML IV SOLN
INTRAVENOUS | Status: AC
  Filled 2015-01-30: qty 1

## 2015-01-30 MED ORDER — FENTANYL CITRATE (PF) 100 MCG/2ML IJ SOLN
INTRAMUSCULAR | Status: DC | PRN
  Administered 2015-01-30: 150 ug via INTRAVENOUS
  Administered 2015-01-30: 100 ug via INTRAVENOUS

## 2015-01-30 MED ORDER — CEFAZOLIN SODIUM-DEXTROSE 2-3 GM-% IV SOLR
2.0000 g | Freq: Once | INTRAVENOUS | Status: AC
  Administered 2015-01-30: 2 g via INTRAVENOUS
  Filled 2015-01-30: qty 50

## 2015-01-30 MED ORDER — SODIUM CHLORIDE 0.9 % IR SOLN
Status: DC | PRN
  Administered 2015-01-30: 1000 mL

## 2015-01-30 MED ORDER — ACETAMINOPHEN 500 MG PO TABS
1000.0000 mg | ORAL_TABLET | Freq: Four times a day (QID) | ORAL | Status: AC
  Administered 2015-01-30 – 2015-01-31 (×4): 1000 mg via ORAL
  Filled 2015-01-30 (×5): qty 2

## 2015-01-30 MED ORDER — SENNOSIDES-DOCUSATE SODIUM 8.6-50 MG PO TABS
1.0000 | ORAL_TABLET | Freq: Two times a day (BID) | ORAL | Status: DC
Start: 2015-01-30 — End: 2015-01-31
  Administered 2015-01-30 – 2015-01-31 (×2): 1 via ORAL
  Filled 2015-01-30 (×2): qty 1

## 2015-01-30 MED ORDER — HYDROMORPHONE HCL 1 MG/ML IJ SOLN
0.5000 mg | INTRAMUSCULAR | Status: DC | PRN
  Administered 2015-01-30 – 2015-01-31 (×5): 1 mg via INTRAVENOUS
  Filled 2015-01-30 (×5): qty 1

## 2015-01-30 MED ORDER — ONDANSETRON HCL 4 MG/2ML IJ SOLN
INTRAMUSCULAR | Status: AC
  Filled 2015-01-30: qty 2

## 2015-01-30 MED ORDER — PROPOFOL 10 MG/ML IV BOLUS
INTRAVENOUS | Status: AC
  Filled 2015-01-30: qty 20

## 2015-01-30 MED ORDER — MIDAZOLAM HCL 2 MG/2ML IJ SOLN
INTRAMUSCULAR | Status: AC
  Filled 2015-01-30: qty 4

## 2015-01-30 MED ORDER — DEXAMETHASONE SODIUM PHOSPHATE 10 MG/ML IJ SOLN
INTRAMUSCULAR | Status: DC | PRN
  Administered 2015-01-30: 10 mg via INTRAVENOUS

## 2015-01-30 MED ORDER — ONDANSETRON HCL 4 MG/2ML IJ SOLN
INTRAMUSCULAR | Status: DC | PRN
  Administered 2015-01-30: 4 mg via INTRAVENOUS

## 2015-01-30 MED ORDER — FENTANYL CITRATE (PF) 100 MCG/2ML IJ SOLN
INTRAMUSCULAR | Status: AC
  Filled 2015-01-30: qty 2

## 2015-01-30 MED ORDER — DEXTROSE 5 % IV SOLN: 2.0000 g | INTRAVENOUS | Status: DC

## 2015-01-30 MED ORDER — SODIUM CHLORIDE 0.9 % IV SOLN
INTRAVENOUS | Status: DC
Start: 2015-01-30 — End: 2015-01-31
  Administered 2015-01-30 – 2015-01-31 (×2): via INTRAVENOUS

## 2015-01-30 MED ORDER — GLYCOPYRROLATE 0.2 MG/ML IJ SOLN
INTRAMUSCULAR | Status: DC | PRN
  Administered 2015-01-30: 0.2 mg via INTRAVENOUS
  Administered 2015-01-30: 0.4 mg via INTRAVENOUS

## 2015-01-30 MED ORDER — ROCURONIUM BROMIDE 100 MG/10ML IV SOLN
INTRAVENOUS | Status: DC | PRN
  Administered 2015-01-30: 60 mg via INTRAVENOUS

## 2015-01-30 MED ORDER — PROMETHAZINE HCL 25 MG/ML IJ SOLN: 6.2500 mg | INTRAMUSCULAR | Status: DC | PRN

## 2015-01-30 MED ORDER — NEOSTIGMINE METHYLSULFATE 10 MG/10ML IV SOLN
INTRAVENOUS | Status: DC | PRN
  Administered 2015-01-30: 3 mg via INTRAVENOUS

## 2015-01-30 MED ORDER — DEXAMETHASONE SODIUM PHOSPHATE 10 MG/ML IJ SOLN
INTRAMUSCULAR | Status: AC
  Filled 2015-01-30: qty 1

## 2015-01-30 MED ORDER — IOHEXOL 300 MG/ML  SOLN
INTRAMUSCULAR | Status: DC | PRN
  Administered 2015-01-30: 25 mL

## 2015-01-30 MED ORDER — LACTATED RINGERS IV SOLN
INTRAVENOUS | Status: DC
  Administered 2015-01-30: 16:00:00 via INTRAVENOUS

## 2015-01-30 MED ORDER — SUFENTANIL CITRATE 50 MCG/ML IV SOLN
INTRAVENOUS | Status: DC | PRN
  Administered 2015-01-30 (×4): 10 ug via INTRAVENOUS

## 2015-01-30 MED ORDER — SODIUM CHLORIDE 0.9 % IJ SOLN
INTRAMUSCULAR | Status: AC
  Filled 2015-01-30: qty 10

## 2015-01-30 MED ORDER — LIDOCAINE HCL (CARDIAC) 20 MG/ML IV SOLN
INTRAVENOUS | Status: DC | PRN
  Administered 2015-01-30: 100 mg via INTRAVENOUS
  Administered 2015-01-30: 25 mg via INTRATRACHEAL

## 2015-01-30 MED ORDER — LABETALOL HCL 5 MG/ML IV SOLN
INTRAVENOUS | Status: DC | PRN
  Administered 2015-01-30: 2.5 mg via INTRAVENOUS

## 2015-01-30 MED ORDER — ROCURONIUM BROMIDE 100 MG/10ML IV SOLN
INTRAVENOUS | Status: AC
  Filled 2015-01-30: qty 1

## 2015-01-30 MED ORDER — ONDANSETRON HCL 4 MG/2ML IJ SOLN
4.0000 mg | INTRAMUSCULAR | Status: DC | PRN
  Administered 2015-01-31: 4 mg via INTRAVENOUS
  Filled 2015-01-30: qty 2

## 2015-01-30 MED ORDER — ALBUTEROL SULFATE (2.5 MG/3ML) 0.083% IN NEBU: 2.5000 mg | INHALATION_SOLUTION | Freq: Four times a day (QID) | RESPIRATORY_TRACT | Status: DC | PRN

## 2015-01-30 MED ORDER — FENTANYL CITRATE (PF) 250 MCG/5ML IJ SOLN
INTRAMUSCULAR | Status: AC
  Filled 2015-01-30: qty 25

## 2015-01-30 MED ORDER — HEMOSTATIC AGENTS (NO CHARGE) OPTIME
TOPICAL | Status: DC | PRN
  Administered 2015-01-30: 1 via TOPICAL

## 2015-01-30 MED ORDER — LACTATED RINGERS IV SOLN
INTRAVENOUS | Status: DC | PRN
  Administered 2015-01-30 (×2): via INTRAVENOUS

## 2015-01-30 MED ORDER — LIDOCAINE HCL (CARDIAC) 20 MG/ML IV SOLN
INTRAVENOUS | Status: AC
  Filled 2015-01-30: qty 5

## 2015-01-30 MED ORDER — SODIUM CHLORIDE 0.9 % IR SOLN
Status: DC | PRN
  Administered 2015-01-30: 6000 mL

## 2015-01-30 MED ORDER — VANCOMYCIN HCL IN DEXTROSE 1-5 GM/200ML-% IV SOLN
1000.0000 mg | INTRAVENOUS | Status: AC
  Administered 2015-01-30: 1000 mg via INTRAVENOUS
  Filled 2015-01-30: qty 200

## 2015-01-30 MED ORDER — ALBUTEROL SULFATE HFA 108 (90 BASE) MCG/ACT IN AERS: 2.0000 | INHALATION_SPRAY | Freq: Four times a day (QID) | RESPIRATORY_TRACT | Status: DC | PRN

## 2015-01-30 MED ORDER — CEFAZOLIN SODIUM-DEXTROSE 2-3 GM-% IV SOLR
INTRAVENOUS | Status: AC
  Filled 2015-01-30: qty 50

## 2015-01-30 MED ORDER — OXYCODONE HCL 5 MG PO TABS: 5.0000 mg | ORAL_TABLET | ORAL | Status: DC | PRN

## 2015-01-30 MED ORDER — PROPOFOL 10 MG/ML IV BOLUS
INTRAVENOUS | Status: DC | PRN
  Administered 2015-01-30: 200 mg via INTRAVENOUS

## 2015-01-30 MED ORDER — 0.9 % SODIUM CHLORIDE (POUR BTL) OPTIME
TOPICAL | Status: DC | PRN
  Administered 2015-01-30: 1000 mL

## 2015-01-30 MED ORDER — MIDAZOLAM HCL 5 MG/5ML IJ SOLN
INTRAMUSCULAR | Status: DC | PRN
  Administered 2015-01-30 (×2): 1 mg via INTRAVENOUS
  Administered 2015-01-30: 2 mg via INTRAVENOUS

## 2015-01-30 SURGICAL SUPPLY — 57 items
APPLICATOR SURGIFLO ENDO (HEMOSTASIS) ×3 IMPLANT
BAG URINE DRAINAGE (UROLOGICAL SUPPLIES) IMPLANT
BAG URO CATCHER STRL LF (DRAPE) ×3 IMPLANT
BASKET LASER NITINOL 1.9FR (BASKET) ×3 IMPLANT
BASKET ZERO TIP NITINOL 2.4FR (BASKET) IMPLANT
BENZOIN TINCTURE PRP APPL 2/3 (GAUZE/BANDAGES/DRESSINGS) ×3 IMPLANT
BLADE SURG 15 STRL LF DISP TIS (BLADE) ×2 IMPLANT
BLADE SURG 15 STRL SS (BLADE) ×1
CATH FOLEY 2W COUNCIL 20FR 5CC (CATHETERS) IMPLANT
CATH FOLEY 2WAY SLVR  5CC 16FR (CATHETERS) ×1
CATH FOLEY 2WAY SLVR 5CC 16FR (CATHETERS) ×2 IMPLANT
CATH IMAGER II 65CM (CATHETERS) ×3 IMPLANT
CATH INTERMIT  6FR 70CM (CATHETERS) ×3 IMPLANT
CATH ROBINSON RED A/P 20FR (CATHETERS) IMPLANT
CATH X-FORCE N30 NEPHROSTOMY (TUBING) IMPLANT
CHLORAPREP W/TINT 26ML (MISCELLANEOUS) ×3 IMPLANT
COVER SURGICAL LIGHT HANDLE (MISCELLANEOUS) ×3 IMPLANT
DRAPE C-ARM 42X120 X-RAY (DRAPES) ×3 IMPLANT
DRAPE LINGEMAN PERC (DRAPES) ×3 IMPLANT
DRAPE SHEET LG 3/4 BI-LAMINATE (DRAPES) ×3 IMPLANT
DRAPE SURG IRRIG POUCH 19X23 (DRAPES) ×3 IMPLANT
DRSG PAD ABDOMINAL 8X10 ST (GAUZE/BANDAGES/DRESSINGS) ×6 IMPLANT
DRSG TEGADERM 8X12 (GAUZE/BANDAGES/DRESSINGS) ×3 IMPLANT
FIBER LASER FLEXIVA 1000 (UROLOGICAL SUPPLIES) IMPLANT
FIBER LASER FLEXIVA 200 (UROLOGICAL SUPPLIES) IMPLANT
FIBER LASER FLEXIVA 365 (UROLOGICAL SUPPLIES) IMPLANT
FIBER LASER FLEXIVA 550 (UROLOGICAL SUPPLIES) IMPLANT
FIBER LASER TRAC TIP (UROLOGICAL SUPPLIES) IMPLANT
GAUZE SPONGE 4X4 12PLY STRL (GAUZE/BANDAGES/DRESSINGS) ×3 IMPLANT
GLOVE BIOGEL M STRL SZ7.5 (GLOVE) ×9 IMPLANT
GOWN STRL REUS W/TWL LRG LVL3 (GOWN DISPOSABLE) ×6 IMPLANT
GUIDEWIRE AMPLAZ .035X145 (WIRE) ×6 IMPLANT
GUIDEWIRE ANG ZIPWIRE 038X150 (WIRE) ×3 IMPLANT
GUIDEWIRE STR DUAL SENSOR (WIRE) ×3 IMPLANT
KIT BASIN OR (CUSTOM PROCEDURE TRAY) ×3 IMPLANT
MANIFOLD NEPTUNE II (INSTRUMENTS) ×3 IMPLANT
NEEDLE TROCAR 18X15 ECHO (NEEDLE) IMPLANT
NEEDLE TROCAR 18X20 (NEEDLE) IMPLANT
NS IRRIG 1000ML POUR BTL (IV SOLUTION) IMPLANT
PACK CYSTO (CUSTOM PROCEDURE TRAY) ×3 IMPLANT
PROBE LITHOCLAST ULTRA 3.8X403 (UROLOGICAL SUPPLIES) IMPLANT
PROBE PNEUMATIC 1.0MMX570MM (UROLOGICAL SUPPLIES) ×3 IMPLANT
SET IRRIG Y TYPE TUR BLADDER L (SET/KITS/TRAYS/PACK) IMPLANT
SET WARMING FLUID IRRIGATION (MISCELLANEOUS) IMPLANT
SHEATH PEELAWAY SET 9 (SHEATH) ×3 IMPLANT
SPONGE LAP 4X18 X RAY DECT (DISPOSABLE) ×3 IMPLANT
STENT CONTOUR 6FRX26X.038 (STENTS) ×3 IMPLANT
STONE CATCHER W/TUBE ADAPTER (UROLOGICAL SUPPLIES) ×3 IMPLANT
SUT SILK 2 0 30  PSL (SUTURE)
SUT SILK 2 0 30 PSL (SUTURE) IMPLANT
SYR 20CC LL (SYRINGE) ×6 IMPLANT
SYR 50ML LL SCALE MARK (SYRINGE) ×3 IMPLANT
SYRINGE 10CC LL (SYRINGE) ×3 IMPLANT
TOWEL OR 17X26 10 PK STRL BLUE (TOWEL DISPOSABLE) ×3 IMPLANT
TUBE FEEDING 8FR 16IN STR KANG (MISCELLANEOUS) IMPLANT
TUBING CONNECTING 10 (TUBING) ×6 IMPLANT
WATER STERILE IRR 1500ML POUR (IV SOLUTION) IMPLANT

## 2015-01-30 NOTE — Brief Op Note (Signed)
01/30/2015  2:20 PM  PATIENT:  [name]  39 y.o. male  PRE-OPERATIVE DIAGNOSIS:  LARGE RIGHT RENAL PELVIS STONE  POST-OPERATIVE DIAGNOSIS:  LARGE RIGHT RENAL PELVIS STONE  PROCEDURE:  Procedure(s): NEPHROLITHOTOMY PERCUTANEOUS, NEPHROSTOGRAM (Right) URETEROSCOPY WITH BASKETING  SURGEON:  Surgeon(s) and Role:    * Sebastian Acheheodore Leeah Politano, MD - Primary  PHYSICIAN ASSISTANT:   ASSISTANTS: none   ANESTHESIA:   general  EBL:  Total I/O In: 1000 [I.V.:1000] Out: 50 [Blood:50]  BLOOD ADMINISTERED:none  DRAINS: foley to gravity   LOCAL MEDICATIONS USED:  NONE  SPECIMEN:  Source of Specimen:  right renal and ureteral stone fragments  DISPOSITION OF SPECIMEN:  [facility] for compositional analysis  COUNTS:  YES  TOURNIQUET:  * No tourniquets in log *  DICTATION: .Other Dictation: Dictation Number [id number]  PLAN OF CARE: Admit to inpatient   PATIENT DISPOSITION:  PACU - hemodynamically stable.   Delay start of Pharmacological VTE agent (>24hrs) due to surgical blood loss or risk of bleeding: yes

## 2015-01-30 NOTE — Progress Notes (Signed)
Hgb. And Hct. Drawn by lab. 

## 2015-01-30 NOTE — Progress Notes (Signed)
Pt arrived to unit from PACU and was received by Charline BillsKristyn RN

## 2015-01-30 NOTE — Anesthesia Procedure Notes (Signed)
Procedure Name: Intubation Date/Time: 01/30/2015 12:53 PM Performed by: Illene SilverEVANS, Xin Klawitter E Pre-anesthesia Checklist: Patient identified, Emergency Drugs available, Suction available and Patient being monitored Patient Re-evaluated:Patient Re-evaluated prior to inductionOxygen Delivery Method: Circle System Utilized Preoxygenation: Pre-oxygenation with 100% oxygen Intubation Type: IV induction Ventilation: Mask ventilation without difficulty Laryngoscope Size: Miller and 3 Grade View: Grade II Tube type: Oral Number of attempts: 2 (1st pass esophageal unable to lift epiglottis  with MAC 4) Airway Equipment and Method: Stylet and Oral airway ( very large epiglottis) Placement Confirmation: ETT inserted through vocal cords under direct vision,  positive ETCO2 and breath sounds checked- equal and bilateral Secured at: 21 cm Tube secured with: Tape (chips in front teeth as preop) Dental Injury: Teeth and Oropharynx as per pre-operative assessment  Difficulty Due To: Difficult Airway- due to anterior larynx

## 2015-01-30 NOTE — OR Nursing (Signed)
Stone taken per Dr. Manny 

## 2015-01-30 NOTE — Transfer of Care (Signed)
Immediate Anesthesia Transfer of Care Note  Patient: Tyrone SageClifton E Tretter  Procedure(s) Performed: Procedure(s): NEPHROLITHOTOMY PERCUTANEOUS, NEPHROSTOGRAM (Right) URETEROSCOPY WITH BASKETING  Patient Location: PACU  Anesthesia Type:General  Level of Consciousness: awake, alert , oriented and patient cooperative  Airway & Oxygen Therapy: Patient Spontanous Breathing and Patient connected to face mask oxygen  Post-op Assessment: Report given to RN, Post -op Vital signs reviewed and stable and Patient moving all extremities X 4  Post vital signs: stable  Last Vitals:  Filed Vitals:   01/30/15 1046  BP: 123/75  Pulse: 75  Temp: 36.4 C  Resp: 18    Complications: No apparent anesthesia complications

## 2015-01-30 NOTE — H&P (Signed)
[name] is an 39 y.o. male.    Chief Complaint: Pre-Op Right Percutaneous Nephrstolithotomy  HPI:   1 - Large Right Renal Stone - s/p right nephrostomy 01/20/15 for large UPJ stone with hydro in setting of UTI/urosepsis. He has h/o sarcoid with recurrent stones and variable compliance to stone sruveillance.   Today "[name]" is seen to proceed with right percutneous nephrostolithotomy. Most recent final UCX with staph for which he has been on specific ABX w/o interval fevers. Most recent Cr 1.44.    Past Medical History  Diagnosis Date  . Sarcoidosis (HCC)   . Asthma     last asthma attack last year in 2012  . Hearing impairment     wears hearing aids  . Wears glasses   . Allergy   . Renal stone   . Hypertension     off bp meds  last 2 years     Past Surgical History  Procedure Laterality Date  . None    . Esophageal dilation    . Cystoscopy/retrograde/ureteroscopy  01/12/2012    Procedure: CYSTOSCOPY/RETROGRADE/URETEROSCOPY;  Surgeon: Sebastian Ache, MD;  Location: [facility];  Service: Urology;  Laterality: Right;  . Nephrolithotomy  02/11/2012    Procedure: NEPHROLITHOTOMY PERCUTANEOUS;  Surgeon: Sebastian Ache, MD;  Location: [facility];  Service: Urology;  Laterality: Right;    Family History  Problem Relation Age of Onset  . Hypertension Mother   . Asthma Mother   . Diabetes Father   . Heart attack Father   . Cancer Paternal Grandmother   . Liver disease Sister    Social History:  reports that he quit smoking about 7 years ago. His smoking use included Cigarettes. He has a .4 pack-year smoking history. He quit smokeless tobacco use about 21 months ago. He reports that he does not drink alcohol or use illicit drugs.  Allergies: No Known Allergies  No prescriptions prior to admission    No results found for this or any previous visit (from the past 48 hour(s)). Dg Chest 2 View  01/29/2015  CLINICAL DATA:  Preoperative nephrolithotomy, right side. History of  sarcoidosis. EXAM: CHEST  2 VIEW COMPARISON:  January 11, 2014 FINDINGS: There is fibrotic change in both upper lobes, stable and consistent with history of previous sarcoidosis. There is no frank edema or consolidation. Heart size and pulmonary vascularity are normal. There is no appreciable adenopathy. No focal bone lesions. Nephrostomy catheter present on right. IMPRESSION: Scarring and fibrosis in the upper lobes, stable and consistent with history of sarcoidosis. No adenopathy appreciable on current examination. No frank edema or consolidation. No change in cardiac silhouette. The appearance of the lungs and cardiac silhouette is essentially stable compared to prior study. Electronically Signed   By: Bretta Bang III M.D.   On: 01/29/2015 14:29    Review of Systems  Constitutional: Negative.  Negative for fever, chills and malaise/fatigue.  HENT: Negative.   Eyes: Negative.   Respiratory: Negative.   Gastrointestinal: Negative.   Genitourinary: Negative.   Musculoskeletal: Negative.   Skin: Negative.   Neurological: Negative.   Endo/Heme/Allergies: Negative.   Psychiatric/Behavioral: Negative.     There were no vitals taken for this visit. Physical Exam  Constitutional: He appears well-developed.  HENT:  Head: Normocephalic.  Eyes: Pupils are equal, round, and reactive to light.  Neck: Normal range of motion.  Cardiovascular: Normal rate.   Respiratory: Effort normal.  GI: Soft.  Genitourinary:  Rt neph tube with clear urine  Musculoskeletal: Normal  range of motion.  Neurological: He is alert.  Skin: Skin is warm.  Psychiatric: He has a normal mood and affect. His behavior is normal. Judgment and thought content normal.     Assessment/Plan  1 - Large Right Renal Stone - We rediscussed percutaneous nephrostolithotomy (PCNL) in detail including need for percutaneous access which is sometimes gained by the surgeon, and other times by radiology or through existing  nephrostomy tubes if present. We specifically rediscussed that often times tubes remain in after surgery until we are confident all stone has been treated. We rementioned that staged surgery is needed in over 50% of cases of very large or complex stone. We then rediscussed general risks including bleeding, infection, damage to kidney / ureter / bladder, loss of kidney, as well as anesthetic risks and rare but serious surgical complications including DVT, PE, MI, and mortality.  He voiced understanding and desire to proceed today as planned.   Victoriano Campion 01/30/2015, 6:55 AM

## 2015-01-30 NOTE — Progress Notes (Signed)
Lab results noted- Hgb. 12.8-  Hct. 37.3

## 2015-01-30 NOTE — Anesthesia Postprocedure Evaluation (Signed)
  Anesthesia Post-op Note  Patient: [name]  Procedure(s) Performed: Procedure(s) (LRB): NEPHROLITHOTOMY PERCUTANEOUS, NEPHROSTOGRAM (Right) URETEROSCOPY WITH BASKETING  Patient Location: PACU  Anesthesia Type: General  Level of Consciousness: awake and alert   Airway and Oxygen Therapy: Patient Spontanous Breathing  Post-op Pain: mild  Post-op Assessment: Post-op Vital signs reviewed, Patient's Cardiovascular Status Stable, Respiratory Function Stable, Patent Airway and No signs of Nausea or vomiting  Last Vitals:  Filed Vitals:   01/30/15 1559  BP: 132/80  Pulse: 70  Temp: 36.6 C  Resp: 14    Post-op Vital Signs: stable   Complications: No apparent anesthesia complications

## 2015-01-30 NOTE — Anesthesia Preprocedure Evaluation (Addendum)
Anesthesia Evaluation  Patient identified by MRN, date of birth, ID band Patient awake    Reviewed: Allergy & Precautions, NPO status , Patient's Chart, lab work & pertinent test results  Airway Mallampati: II  TM Distance: >3 FB Neck ROM: Full    Dental no notable dental hx.    Pulmonary asthma , former smoker,  H/O sarcoidosis.  CXR stable, chronic changes.   Pulmonary exam normal breath sounds clear to auscultation       Cardiovascular hypertension, negative cardio ROS Normal cardiovascular exam Rhythm:Regular Rate:Normal     Neuro/Psych negative neurological ROS  negative psych ROS   GI/Hepatic negative GI ROS, Neg liver ROS,   Endo/Other  negative endocrine ROS  Renal/GU Renal InsufficiencyRenal diseaseElevated creatinine is improving. Cr 1.44. K 3.6  negative genitourinary   Musculoskeletal negative musculoskeletal ROS (+)   Abdominal   Peds negative pediatric ROS (+)  Hematology negative hematology ROS (+)   Anesthesia Other Findings   Reproductive/Obstetrics negative OB ROS                          Anesthesia Physical Anesthesia Plan  ASA: II  Anesthesia Plan: General   Post-op Pain Management:    Induction: Intravenous  Airway Management Planned: Oral ETT  Additional Equipment:   Intra-op Plan:   Post-operative Plan: Extubation in OR  Informed Consent: I have reviewed the patients History and Physical, chart, labs and discussed the procedure including the risks, benefits and alternatives for the proposed anesthesia with the patient or authorized representative who has indicated his/her understanding and acceptance.   Dental advisory given  Plan Discussed with: CRNA  Anesthesia Plan Comments:        Anesthesia Quick Evaluation

## 2015-01-31 LAB — BASIC METABOLIC PANEL
ANION GAP: 7 (ref 5–15)
BUN: 22 mg/dL — ABNORMAL HIGH (ref 6–20)
CALCIUM: 9.3 mg/dL (ref 8.9–10.3)
CO2: 24 mmol/L (ref 22–32)
Chloride: 103 mmol/L (ref 101–111)
Creatinine, Ser: 1.36 mg/dL — ABNORMAL HIGH (ref 0.61–1.24)
GLUCOSE: 119 mg/dL — AB (ref 65–99)
POTASSIUM: 4.3 mmol/L (ref 3.5–5.1)
SODIUM: 134 mmol/L — AB (ref 135–145)

## 2015-01-31 LAB — HEMOGLOBIN AND HEMATOCRIT, BLOOD
HEMATOCRIT: 36.2 % — AB (ref 39.0–52.0)
HEMOGLOBIN: 12.5 g/dL — AB (ref 13.0–17.0)

## 2015-01-31 MED ORDER — SULFAMETHOXAZOLE-TRIMETHOPRIM 800-160 MG PO TABS: 1.0000 | ORAL_TABLET | Freq: Two times a day (BID) | ORAL | Status: DC

## 2015-01-31 MED ORDER — OXYCODONE-ACETAMINOPHEN 5-325 MG PO TABS: 1.0000 | ORAL_TABLET | Freq: Four times a day (QID) | ORAL | Status: DC | PRN

## 2015-01-31 MED ORDER — SENNOSIDES-DOCUSATE SODIUM 8.6-50 MG PO TABS: 1.0000 | ORAL_TABLET | Freq: Two times a day (BID) | ORAL | Status: DC

## 2015-01-31 NOTE — Op Note (Signed)
NAME:  [name], [name]               ACCOUNT NO.:  [id number]  MEDICAL RECORD NO.:  [id number]  LOCATION:  1417                         [facility]  PHYSICIAN:  Sebastian Acheheodore Farris Blash, MD     DATE OF BIRTH:  08/04/1975  DATE OF PROCEDURE:  01/30/2015                              OPERATIVE REPORT  DIAGNOSIS:  Large right renal and ureteral stone.  PROCEDURE: 1. Right percutaneous nephrostolithotomy, stone less than 2 cm. 2. Right antegrade nephrostogram interpretation. 3. Placement of right ureteral stent 6 x 26 Contour, no tether. 4. Right antegrade ureteroscopy, basketing of stone.  ESTIMATED BLOOD LOSS:  100 mL.  COMPLICATIONS:  None.  SPECIMENS:  Right renal and ureteral stone fragments for composition analysis.  FINDINGS: 1. Large right UPJ stone as anticipated, this was approximately 14 mm,     this was amenable to simple manual fracturing and extraction. 2. No significant intrarenal stones other than Christian aforementioned UPJ     stone. 3. Small proximal right ureteral stone that was amenable to     ureteroscopic basketing. 4. Successful placement of right ureteral stent, proximal in renal     pelvis and distal in urinary bladder.  DRAINS:  One Foley catheter to straight drain.  INDICATION:  [name] is a pleasant 39 year old gentleman with history of recurrent nephrolithiasis.  He is status post large volume percutaneous procedure in 2013.  He ultimately was lost to follow up after this.  Despite recommendations for continued stone surveillance and metabolic evaluation, he subsequently re-presented last month with a febrile and septic large right UPJ stone.  At that time, he underwent nephrostomy tube placement.  Urine cultures were consistent with several pockets which he had had previously.  He cleared his infectious parameters, he has been on culture-specific antibiotics and now presents for definitive stone management with percutaneous approach.  Informed consent was  obtained and placed in medical record.  PROCEDURE IN DETAIL:  [name] verified, procedure being right percutaneous nephrostolithotomy was confirmed. Procedure was carried out.  Time-out was performed.  Intravenous antibiotics administered.  General endotracheal anesthesia was introduced.  Christian patient was placed into a complete prone position, applying ProneView apparatus, chest rolls, padding of his knees and ankles, sequential compression devices, blankets rolled to Christian shoulders to take pressure off his shoulders.  A Foley catheter had previously been placed per urethra to straight drain.  10 degrees of table flexion was used to maximize space between Christian 12th rib and iliac crest.  A sterile field was created by prepping and draping Christian patient's right flank including Christian in situ nephrostomy tube after capping it allowing to dry appropriately.  Antegrade nephrostogram was obtained.  Right antegrade nephrostogram revealed a single system right kidney and right ureter.  There was a large filling defect in Christian area of Christian UPJ consistent with a known stone.  A 0.038 ZIPwire was then advanced over Christian renal pelvis.  Christian nephrostomy tube was removed, and Christian ZIPwire was guided via Christian Imager catheter down Christian level of Christian urinary bladder and exchanged for a Super stiff wire via Christian Imager catheter.  Christian skin incision was then made at Christian site approximately  1.5 cm in diameter, and Christian fascia was dilated using hemostats under fluoroscopic guidance, and Christian dual-lumen introducer was advanced to Christian level of proximal ureter through which Christian ZIPwire was once again advanced to Christian level of urinary bladder and exchanged for a second Super Stiff wire via Christian Imager catheter.  Using Christian NephroMax balloon dilation apparatus, Christian balloon was advanced across Christian kidney into Christian level of Christian renal pelvis, inflated to a pressure of 18 atmospheres, held for 90 seconds.  There  was appropriate wasting and then released at Christian level of Christian perinephric fascia.  Christian access sheath was advanced under fluoroscopic guidance to Christian level of renal pelvis, taking great care to avoid medial perforation which did not occur.  This was left in situ, Christian balloon in place for 90 seconds. Christian balloon was removed and rigid nephroscopy was performed.  Rigid nephroscopy revealed excellent position of sheath in Christian renal pelvis.  This allowed direct visualization of Christian large UPJ stone.  This was somewhat ovoid in appearance.  This was grasped with cold graspers and fractured into 2 fragments that were then each amenable to simple extraction, these were set aside for compositional analysis.  Flexible nephroscopy was performed using a 16-French flexible cystoscope and all accessible calices were inspected x2.  No additional stone fragments were noted.  A separate Sensor wire was advanced down Christian level of Christian ureter over which a flexible ureteroscope was advanced to Christian level of Christian proximal ureter.  Flexible ureteroscopy in antegrade fashion revealed a single dominant upper ureteral stone.  This appeared to be amenable to simple basketing and Escape-type basket was used to grasp Christian stone on its long axis and was removed and set aside.  Christian ureter became somewhat more narrow below this point which did not easily allow flexible ureteroscopy of Christian distal two-thirds of Christian ureter; however, antegrade nephrostogram had revealed no obvious filling defects within this area so that we had achieved Christian goals of procedure today being confirmation of stone-free status on Christian right.  Final antegrade nephrostogram revealed no evidence of perforation, free flow of contrast down Christian level of Christian right ureter at Christian level of urinary bladder.  Over Christian working wire, which was Christian Super Stiff wire, a 6 x 26 Contour-type stent was placed in Christian antegrade fashion using fluoroscopic and nephroscopic  guidance, proximal end in renal pelvis and distal end in urinary bladder, verifying this did not change position under fluoroscopy.  Remaining safety wire was removed.  Christian sheath was removed with application of 10 mL of FloSeal along Christian percutaneous tract.  Pressure was held on this with manual pressure for 3 minutes. This was closed at Christian level of skin using interrupted Vicryl x3 and percutaneous dressing was applied.  Procedure was terminated.  Christian patient tolerated Christian procedure well.  There were no immediate periprocedural complications.  Christian patient was taken to postanesthesia care unit in stable condition.          ______________________________ Sebastian Ache, MD     TM/MEDQ  D:  01/30/2015  T:  01/31/2015  Job:  [id number]

## 2015-01-31 NOTE — Discharge Instructions (Signed)
1 - You may have urinary urgency (bladder spasms) and bloody urine on / off with stent in place. This is normal. ° °2 - Call MD or go to ER for fever >102, severe pain / nausea / vomiting not relieved by medications, or acute change in medical status ° °

## 2015-01-31 NOTE — Care Management Note (Signed)
Case Management Note  Patient Details  Name: [name] MRN: [id number] Date of Birth: 05-25-1975  Subjective/Objective:  39 y/o m admitted w/r ureteral stone. S/p R PCN. From home.                  Action/Plan:d/c home no needs or orders.   Expected Discharge Date:                  Expected Discharge Plan:  Home/Self Care  In-House Referral:     Discharge planning Services  CM Consult  Post Acute Care Choice:    Choice offered to:     DME Arranged:    DME Agency:     HH Arranged:    HH Agency:     Status of Service:  Completed, signed off  Medicare Important Message Given:    Date Medicare IM Given:    Medicare IM give by:    Date Additional Medicare IM Given:    Additional Medicare Important Message give by:     If discussed at Long Length of Stay Meetings, dates discussed:    Additional Comments:  Lanier ClamMahabir, Lilliah Priego, RN 01/31/2015, 9:42 AM

## 2015-01-31 NOTE — Discharge Summary (Signed)
Physician Discharge Summary  Patient ID: [name] MRN: [id number] DOB/AGE: 1975/08/07 39 y.o.  Admit date: 01/30/2015 Discharge date: 01/31/2015  Admission Diagnoses: Rt Large Renal-Ureteral Stone  Discharge Diagnoses:  Active Problems:   Staghorn kidney stones   Discharged Condition: good  Hospital Course:   Rt Large Renal-Ureteral Stone - Pt underwent right percutaneous nephrostolithotomy with JJ stent placmeent and antegrade ureterosopy on 10/27, the day of admission, without acute complications.  By POD 1, the day of discharge, Hgb stable, Pain controlled, tollerating PO meds, ambulatory, and felt to be adequate for discharge.   Consults: None  Significant Diagnostic Studies: labs: Hgb >12. Cr <1.5  Treatments: surgery: right percutaneous nephrostolithotomy with JJ stent placmeent and antegrade ureterosopy on 10/27,  Discharge Exam: Blood pressure 125/80, pulse 58, temperature 97.7 F (36.5 C), temperature source Oral, resp. rate 16, height 5' 11.5" (1.816 m), weight 71.47 kg (157 lb 9 oz), SpO2 99 %. General appearance: alert, cooperative and appears stated age Head: Normocephalic, without obvious abnormality, atraumatic Eyes: negative Nose: Nares normal. Septum midline. Mucosa normal. No drainage or sinus tenderness. Throat: lips, mucosa, and tongue normal; teeth and gums normal Neck: supple, symmetrical, trachea midline Back: symmetric, no curvature. ROM normal. No CVA tenderness. Resp: non-labored on room air Cardio: Nl rate GI: soft, non-tender; bowel sounds normal; no masses,  no organomegaly Male genitalia: normal Extremities: extremities normal, atraumatic, no cyanosis or edema Pulses: 2+ and symmetric Skin: Skin color, texture, turgor normal. No rashes or lesions Lymph nodes: Cervical, supraclavicular, and axillary nodes normal. Neurologic: Grossly normal Incision/Wound: Rt flank site c/d/i with dry dressing. No severe CVAT / hematoma /  ecchymoses  Disposition: 01-Home or Self Care     Medication List    STOP taking these medications        traMADol 50 MG tablet  Commonly known as:  ULTRAM      TAKE these medications        albuterol 108 (90 BASE) MCG/ACT inhaler  Commonly known as:  PROVENTIL HFA;VENTOLIN HFA  Inhale 2 puffs into the lungs every 6 (six) hours as needed for wheezing.     ibuprofen 200 MG tablet  Commonly known as:  ADVIL,MOTRIN  Take 600 mg by mouth every 6 (six) hours as needed (pain).     mometasone 220 MCG/INH inhaler  Commonly known as:  ASMANEX 30 METERED DOSES  Inhale 1 puff into the lungs 2 (two) times daily.     oxyCODONE-acetaminophen 5-325 MG tablet  Commonly known as:  ROXICET  Take 1-2 tablets by mouth every 6 (six) hours as needed for moderate pain or severe pain. Post-operatively     senna-docusate 8.6-50 MG tablet  Commonly known as:  Senokot-S  Take 1 tablet by mouth 2 (two) times daily. While taking pain meds to prevent constipation.     sulfamethoxazole-trimethoprim 800-160 MG tablet  Commonly known as:  BACTRIM DS,SEPTRA DS  Take 1 tablet by mouth 2 (two) times daily. X 3 days. Begin day before next Urology appointment.           Follow-up Information    Follow up with Sebastian AcheMANNY, Maci Eickholt, MD On 02/13/2015.   Specialty:  Urology   Why:  at 12:00 for MD visit and office stent removal   Contact information:   [address] [place] [id number] [phone]       Signed: Sebastian AcheMANNY, Brennen Gardiner 01/31/2015, 6:58 AM

## 2015-08-19 ENCOUNTER — Other Ambulatory Visit: Payer: Self-pay | Admitting: Urology

## 2015-08-20 ENCOUNTER — Ambulatory Visit (HOSPITAL_BASED_OUTPATIENT_CLINIC_OR_DEPARTMENT_OTHER)
Admission: RE | Admit: 2015-08-20 | Discharge: 2015-08-20 | Disposition: A | Discharge status: Home / Self Care | Payer: Commercial Managed Care - HMO | Source: Ambulatory Visit | Attending: Urology | Admitting: Urology

## 2015-08-20 ENCOUNTER — Encounter (HOSPITAL_BASED_OUTPATIENT_CLINIC_OR_DEPARTMENT_OTHER)
Admission: RE | Disposition: A | Discharge status: Home / Self Care | Payer: Self-pay | Source: Ambulatory Visit | Attending: Urology

## 2015-08-20 ENCOUNTER — Encounter (HOSPITAL_BASED_OUTPATIENT_CLINIC_OR_DEPARTMENT_OTHER): Payer: Self-pay | Admitting: *Deleted

## 2015-08-20 ENCOUNTER — Ambulatory Visit (HOSPITAL_BASED_OUTPATIENT_CLINIC_OR_DEPARTMENT_OTHER): Payer: Commercial Managed Care - HMO | Admitting: Anesthesiology

## 2015-08-20 DIAGNOSIS — N12 Tubulo-interstitial nephritis, not specified as acute or chronic: Secondary | ICD-10-CM | POA: Diagnosis not present

## 2015-08-20 DIAGNOSIS — N202 Calculus of kidney with calculus of ureter: Secondary | ICD-10-CM | POA: Diagnosis present

## 2015-08-20 DIAGNOSIS — Z87442 Personal history of urinary calculi: Secondary | ICD-10-CM | POA: Diagnosis not present

## 2015-08-20 DIAGNOSIS — N132 Hydronephrosis with renal and ureteral calculous obstruction: Secondary | ICD-10-CM | POA: Diagnosis not present

## 2015-08-20 DIAGNOSIS — Z87891 Personal history of nicotine dependence: Secondary | ICD-10-CM | POA: Insufficient documentation

## 2015-08-20 HISTORY — PX: STONE EXTRACTION WITH BASKET: SHX5318

## 2015-08-20 HISTORY — PX: HOLMIUM LASER APPLICATION: SHX5852

## 2015-08-20 HISTORY — PX: CYSTOSCOPY WITH URETEROSCOPY AND STENT PLACEMENT: SHX6377

## 2015-08-20 HISTORY — PX: CYSTOSCOPY W/ RETROGRADES: SHX1426

## 2015-08-20 LAB — POCT I-STAT, CHEM 8
BUN: 25 mg/dL — ABNORMAL HIGH (ref 6–20)
Calcium, Ion: 1.31 mmol/L — ABNORMAL HIGH (ref 1.12–1.23)
Chloride: 100 mmol/L — ABNORMAL LOW (ref 101–111)
Creatinine, Ser: 1.5 mg/dL — ABNORMAL HIGH (ref 0.61–1.24)
Glucose, Bld: 87 mg/dL (ref 65–99)
HCT: 46 % (ref 39.0–52.0)
Hemoglobin: 15.6 g/dL (ref 13.0–17.0)
Potassium: 3.9 mmol/L (ref 3.5–5.1)
Sodium: 142 mmol/L (ref 135–145)
TCO2: 30 mmol/L (ref 0–100)

## 2015-08-20 SURGERY — CYSTOURETEROSCOPY, WITH STENT INSERTION
Anesthesia: General | Site: Renal | Laterality: Right

## 2015-08-20 MED ORDER — ONDANSETRON HCL 4 MG/2ML IJ SOLN
INTRAMUSCULAR | Status: DC | PRN
  Administered 2015-08-20: 4 mg via INTRAVENOUS

## 2015-08-20 MED ORDER — OXYBUTYNIN CHLORIDE 5 MG PO TABS
5.0000 mg | ORAL_TABLET | ORAL | Status: AC
  Administered 2015-08-20: 5 mg via ORAL
  Filled 2015-08-20: qty 1

## 2015-08-20 MED ORDER — FENTANYL CITRATE (PF) 100 MCG/2ML IJ SOLN
INTRAMUSCULAR | Status: AC
  Filled 2015-08-20: qty 2

## 2015-08-20 MED ORDER — KETOROLAC TROMETHAMINE 30 MG/ML IJ SOLN
INTRAMUSCULAR | Status: DC | PRN
  Administered 2015-08-20: 30 mg via INTRAVENOUS

## 2015-08-20 MED ORDER — GENTAMICIN SULFATE 40 MG/ML IJ SOLN
380.0000 mg | Freq: Once | INTRAVENOUS | Status: AC
  Administered 2015-08-20: 380 mg via INTRAVENOUS
  Filled 2015-08-20: qty 9.5

## 2015-08-20 MED ORDER — LIDOCAINE HCL (CARDIAC) 20 MG/ML IV SOLN
INTRAVENOUS | Status: AC
  Filled 2015-08-20: qty 5

## 2015-08-20 MED ORDER — MIDAZOLAM HCL 5 MG/5ML IJ SOLN
INTRAMUSCULAR | Status: DC | PRN
  Administered 2015-08-20: 2 mg via INTRAVENOUS

## 2015-08-20 MED ORDER — PROPOFOL 10 MG/ML IV BOLUS
INTRAVENOUS | Status: AC
  Filled 2015-08-20: qty 40

## 2015-08-20 MED ORDER — FENTANYL CITRATE (PF) 100 MCG/2ML IJ SOLN
INTRAMUSCULAR | Status: DC | PRN
  Administered 2015-08-20: 50 ug via INTRAVENOUS
  Administered 2015-08-20 (×2): 25 ug via INTRAVENOUS

## 2015-08-20 MED ORDER — OXYCODONE-ACETAMINOPHEN 5-325 MG PO TABS
1.0000 | ORAL_TABLET | Freq: Once | ORAL | Status: DC
  Filled 2015-08-20: qty 1

## 2015-08-20 MED ORDER — PROPOFOL 10 MG/ML IV BOLUS
INTRAVENOUS | Status: DC | PRN
  Administered 2015-08-20: 200 mg via INTRAVENOUS

## 2015-08-20 MED ORDER — MIDAZOLAM HCL 2 MG/2ML IJ SOLN
INTRAMUSCULAR | Status: AC
  Filled 2015-08-20: qty 2

## 2015-08-20 MED ORDER — LIDOCAINE HCL (CARDIAC) 20 MG/ML IV SOLN
INTRAVENOUS | Status: DC | PRN
  Administered 2015-08-20: 100 mg via INTRAVENOUS

## 2015-08-20 MED ORDER — LACTATED RINGERS IV SOLN
INTRAVENOUS | Status: DC
  Administered 2015-08-20 (×2): via INTRAVENOUS
  Filled 2015-08-20: qty 1000

## 2015-08-20 MED ORDER — HYDROMORPHONE HCL 1 MG/ML IJ SOLN
0.2500 mg | INTRAMUSCULAR | Status: DC | PRN
  Administered 2015-08-20: 0.5 mg via INTRAVENOUS
  Filled 2015-08-20: qty 1

## 2015-08-20 MED ORDER — SULFAMETHOXAZOLE-TRIMETHOPRIM 800-160 MG PO TABS: 1.0000 | ORAL_TABLET | Freq: Two times a day (BID) | ORAL | Status: DC

## 2015-08-20 MED ORDER — HYDROMORPHONE HCL 1 MG/ML IJ SOLN
INTRAMUSCULAR | Status: AC
  Filled 2015-08-20: qty 1

## 2015-08-20 MED ORDER — OXYCODONE-ACETAMINOPHEN 5-325 MG PO TABS
ORAL_TABLET | ORAL | Status: AC
  Filled 2015-08-20: qty 1

## 2015-08-20 MED ORDER — GENTAMICIN IN SALINE 1.6-0.9 MG/ML-% IV SOLN
80.0000 mg | INTRAVENOUS | Status: DC
  Filled 2015-08-20: qty 50

## 2015-08-20 MED ORDER — OXYBUTYNIN CHLORIDE 5 MG PO TABS
ORAL_TABLET | ORAL | Status: AC
  Filled 2015-08-20: qty 1

## 2015-08-20 MED ORDER — DIATRIZOATE MEGLUMINE 30 % UR SOLN
URETHRAL | Status: DC | PRN
  Administered 2015-08-20: 20 mL

## 2015-08-20 MED ORDER — DEXAMETHASONE SODIUM PHOSPHATE 4 MG/ML IJ SOLN
INTRAMUSCULAR | Status: DC | PRN
  Administered 2015-08-20: 10 mg via INTRAVENOUS

## 2015-08-20 MED ORDER — OXYCODONE-ACETAMINOPHEN 5-325 MG PO TABS
1.0000 | ORAL_TABLET | Freq: Four times a day (QID) | ORAL | Status: DC | PRN
  Administered 2015-08-20 (×2): 1 via ORAL
  Filled 2015-08-20: qty 2

## 2015-08-20 MED ORDER — SODIUM CHLORIDE 0.9 % IR SOLN
Status: DC | PRN
  Administered 2015-08-20: 4000 mL

## 2015-08-20 MED ORDER — PROMETHAZINE HCL 25 MG/ML IJ SOLN
6.2500 mg | INTRAMUSCULAR | Status: DC | PRN
  Filled 2015-08-20: qty 1

## 2015-08-20 SURGICAL SUPPLY — 43 items
BAG DRAIN URO-CYSTO SKYTR STRL (DRAIN) ×2 IMPLANT
BASKET LASER NITINOL 1.9FR (BASKET) ×2 IMPLANT
BASKET STNLS GEMINI 4WIRE 3FR (BASKET) IMPLANT
BASKET ZERO TIP NITINOL 2.4FR (BASKET) IMPLANT
CANISTER SUCT LVC 12 LTR MEDI- (MISCELLANEOUS) IMPLANT
CATH INTERMIT  6FR 70CM (CATHETERS) ×2 IMPLANT
CATH URET 5FR 28IN CONE TIP (BALLOONS)
CATH URET 5FR 28IN OPEN ENDED (CATHETERS) IMPLANT
CATH URET 5FR 70CM CONE TIP (BALLOONS) IMPLANT
CLOTH BEACON ORANGE TIMEOUT ST (SAFETY) ×2 IMPLANT
ELECT REM PT RETURN 9FT ADLT (ELECTROSURGICAL)
ELECTRODE REM PT RTRN 9FT ADLT (ELECTROSURGICAL) IMPLANT
FIBER LASER FLEXIVA 365 (UROLOGICAL SUPPLIES) IMPLANT
FIBER LASER TRAC TIP (UROLOGICAL SUPPLIES) ×2 IMPLANT
GLOVE BIO SURGEON STRL SZ 6.5 (GLOVE) ×2 IMPLANT
GLOVE BIO SURGEON STRL SZ7 (GLOVE) ×2 IMPLANT
GLOVE BIO SURGEON STRL SZ7.5 (GLOVE) ×2 IMPLANT
GLOVE BIOGEL PI IND STRL 6.5 (GLOVE) ×2 IMPLANT
GLOVE BIOGEL PI INDICATOR 6.5 (GLOVE) ×2
GOWN STRL REUS W/ TWL LRG LVL3 (GOWN DISPOSABLE) ×2 IMPLANT
GOWN STRL REUS W/ TWL XL LVL3 (GOWN DISPOSABLE) ×1 IMPLANT
GOWN STRL REUS W/TWL LRG LVL3 (GOWN DISPOSABLE) ×4 IMPLANT
GOWN STRL REUS W/TWL XL LVL3 (GOWN DISPOSABLE) ×1
GUIDEWIRE 0.038 PTFE COATED (WIRE) IMPLANT
GUIDEWIRE ANG ZIPWIRE 038X150 (WIRE) ×2 IMPLANT
GUIDEWIRE STR DUAL SENSOR (WIRE) ×2 IMPLANT
IV NS 1000ML (IV SOLUTION) ×1
IV NS 1000ML BAXH (IV SOLUTION) ×1 IMPLANT
IV NS IRRIG 3000ML ARTHROMATIC (IV SOLUTION) ×4 IMPLANT
KIT BALLIN UROMAX 15FX10 (LABEL) IMPLANT
KIT BALLN UROMAX 15FX4 (MISCELLANEOUS) IMPLANT
KIT BALLN UROMAX 26 75X4 (MISCELLANEOUS)
KIT ROOM TURNOVER WOR (KITS) ×2 IMPLANT
MANIFOLD NEPTUNE II (INSTRUMENTS) ×2 IMPLANT
NS IRRIG 500ML POUR BTL (IV SOLUTION) ×2 IMPLANT
PACK CYSTO (CUSTOM PROCEDURE TRAY) ×2 IMPLANT
SET HIGH PRES BAL DIL (LABEL)
SHEATH ACCESS URETERAL 38CM (SHEATH) ×2 IMPLANT
STENT POLARIS 5FRX28 (STENTS) ×2 IMPLANT
SYRINGE 10CC LL (SYRINGE) ×2 IMPLANT
SYRINGE IRR TOOMEY STRL 70CC (SYRINGE) IMPLANT
TUBE CONNECTING 12X1/4 (SUCTIONS) IMPLANT
TUBE FEEDING 8FR 16IN STR KANG (MISCELLANEOUS) IMPLANT

## 2015-08-20 NOTE — Transfer of Care (Signed)
Immediate Anesthesia Transfer of Care NoteLast Vitals:  Filed Vitals:   08/20/15 1138  BP: 134/93  Pulse: 64  Temp: 36.5 C  Resp: 16    Last Pain:  Filed Vitals:   08/20/15 1235  PainSc: 6       Patients Stated Pain Goal: 10 (08/20/15 1158)  Immediate Anesthesia Transfer of Care Note  Patient: Christian SageClifton E Jones  Procedure(s) Performed: Procedure(s) (LRB): CYSTOSCOPY WITH URETEROSCOPY AND STENT PLACEMENT (Right) HOLMIUM LASER APPLICATION (Right) CYSTOSCOPY WITH RETROGRADE PYELOGRAM (Right) STONE EXTRACTION WITH BASKET (Right)  Patient Location: PACU  Anesthesia Type: General  Level of Consciousness: awake, alert  and oriented  Airway & Oxygen Therapy: Patient Spontanous Breathing and Patient connected to nasal cannula oxygen  Post-op Assessment: Report given to PACU RN and Post -op Vital signs reviewed and stable  Post vital signs: Reviewed and stable  Complications: No apparent anesthesia complications

## 2015-08-20 NOTE — Anesthesia Preprocedure Evaluation (Addendum)
Anesthesia Evaluation  Patient identified by MRN, date of birth, ID band Patient awake    Reviewed: Allergy & Precautions, NPO status , Patient's Chart, lab work & pertinent test results  Airway Mallampati: II  TM Distance: >3 FB Neck ROM: Full    Dental no notable dental hx. (+) Teeth Intact, Dental Advisory Given   Pulmonary asthma , former smoker,  H/O sarcoidosis.  CXR stable, chronic changes.   Pulmonary exam normal        Cardiovascular hypertension, negative cardio ROS Normal cardiovascular exam     Neuro/Psych negative neurological ROS  negative psych ROS   GI/Hepatic negative GI ROS, Neg liver ROS,   Endo/Other  negative endocrine ROS  Renal/GU Renal InsufficiencyRenal diseaseElevated creatinine is improving. Cr 1.44. K 3.6  negative genitourinary   Musculoskeletal negative musculoskeletal ROS (+)   Abdominal   Peds negative pediatric ROS (+)  Hematology negative hematology ROS (+)   Anesthesia Other Findings   Reproductive/Obstetrics negative OB ROS                            Anesthesia Physical  Anesthesia Plan  ASA: II  Anesthesia Plan: General   Post-op Pain Management:    Induction: Intravenous  Airway Management Planned: LMA  Additional Equipment:   Intra-op Plan:   Post-operative Plan: Extubation in OR  Informed Consent: I have reviewed the patients History and Physical, chart, labs and discussed the procedure including the risks, benefits and alternatives for the proposed anesthesia with the patient or authorized representative who has indicated his/her understanding and acceptance.   Dental advisory given  Plan Discussed with: CRNA and Anesthesiologist  Anesthesia Plan Comments:        Anesthesia Quick Evaluation

## 2015-08-20 NOTE — Anesthesia Procedure Notes (Signed)
Procedure Name: LMA Insertion Date/Time: 08/20/2015 2:38 PM Performed by: Norva PavlovALLAWAY, Brantlee Hinde G Pre-anesthesia Checklist: Patient identified, Emergency Drugs available, Suction available and Patient being monitored Patient Re-evaluated:Patient Re-evaluated prior to inductionOxygen Delivery Method: Circle System Utilized Preoxygenation: Pre-oxygenation with 100% oxygen Intubation Type: IV induction Ventilation: Mask ventilation without difficulty LMA: LMA inserted LMA Size: 4.0 Number of attempts: 1 Airway Equipment and Method: bite block Placement Confirmation: positive ETCO2 Tube secured with: Tape Dental Injury: Teeth and Oropharynx as per pre-operative assessment

## 2015-08-20 NOTE — Discharge Instructions (Signed)
1 - You may have urinary urgency (bladder spasms) and bloody urine on / off with stent in place. This is normal.  2 - Call MD or go to ER for fever >102, severe pain / nausea / vomiting not relieved by medications, or acute change in medical status  3  Remove tethered stent on Friday morning  at home by pulling on string, then blue-white plastic tubing, and discarding. Dr. Berneice HeinrichManny is in office Friday if any issues arise.  [facility] [phone] Post Ureteroscopy With or Without Stent Instructions  Definitions:  Ureter: The duct that transports urine from the kidney to the bladder. Stent:   A plastic hollow tube that is placed into the ureter, from the kidney to the                 bladder to prevent the ureter from swelling shut.  GENERAL INSTRUCTIONS:  Despite the fact that no skin incisions were used, the area around the ureter and bladder is raw and irritated. The stent is a foreign body which will further irritate the bladder wall. This irritation is manifested by increased frequency of urination, both day and night, and by an increase in the urge to urinate. In some, the urge to urinate is present almost always. Sometimes the urge is strong enough that you may not be able to stop yourself from urinating. The only real cure is to remove the stent and then give time for the bladder wall to heal which can't be done until the danger of the ureter swelling shut has passed, which varies.  You may see some blood in your urine while the stent is in place and a few days afterwards. Do not be alarmed, even if the urine was clear for a while. Get off your feet and drink lots of fluids until clearing occurs. If you start to pass clots or don't improve, call us.  DIET: You may return to your normal diet immediately. Because of the raw surface of your bladder, alcohol, spicy foods, acid type foods and drinks with caffeine may cause irritation or frequency and should be used in  moderation. To keep your urine flowing freely and to avoid constipation, drink plenty of fluids during the day ( 8-10 glasses ). Tip: Avoid cranberry juice because it is very acidic.  ACTIVITY: Your physical activity doesn't need to be restricted. However, if you are very active, you may see some blood in your urine. We suggest that you reduce your activity under these circumstances until the bleeding has stopped.  BOWELS: It is important to keep your bowels regular during the postoperative period. Straining with bowel movements can cause bleeding. A bowel movement every other day is reasonable. Use a mild laxative if needed, such as Milk of Magnesia 2-3 tablespoons, or 2 Dulcolax tablets. Call if you continue to have problems. If you have been taking narcotics for pain, before, during or after your surgery, you may be constipated. Take a laxative if necessary.   MEDICATION: You should resume your pre-surgery medications unless told not to. In addition you will often be given an antibiotic to prevent infection. These should be taken as prescribed until the bottles are finished unless you are having an unusual reaction to one of the drugs.  PROBLEMS YOU SHOULD REPORT TO US:  Fevers over 100.5 Fahrenheit.  Heavy bleeding, or clots ( See above notes about blood in urine ).  Inability to urinate.  Drug reactions ( hives, rash, nausea, vomiting, diarrhea ).  Severe burning or pain with urination that is not improving.  FOLLOW-UP: You will need a follow-up appointment to monitor your progress. Call for this appointment at the number listed above. Usually the first appointment will be about three to fourteen days after your surgery.   Post Anesthesia Home Care Instructions  Activity: Get plenty of rest for the remainder of the day. A responsible adult should stay with you for 24 hours following the procedure.  For the next 24 hours, DO NOT: -Drive a car -Advertising copywriter -Drink  alcoholic beverages -Take any medication unless instructed by your physician -Make any legal decisions or sign important papers.  Meals: Start with liquid foods such as gelatin or soup. Progress to regular foods as tolerated. Avoid greasy, spicy, heavy foods. If nausea and/or vomiting occur, drink only clear liquids until the nausea and/or vomiting subsides. Call your physician if vomiting continues.  Special Instructions/Symptoms: Your throat may feel dry or sore from the anesthesia or the breathing tube placed in your throat during surgery. If this causes discomfort, gargle with warm salt water. The discomfort should disappear within 24 hours.  If you had a scopolamine patch placed behind your ear for the management of post- operative nausea and/or vomiting:  1. The medication in the patch is effective for 72 hours, after which it should be removed.  Wrap patch in a tissue and discard in the trash. Wash hands thoroughly with soap and water. 2. You may remove the patch earlier than 72 hours if you experience unpleasant side effects which may include dry mouth, dizziness or visual disturbances. 3. Avoid touching the patch. Wash your hands with soap and water after contact with the patch.

## 2015-08-20 NOTE — Brief Op Note (Signed)
08/20/2015  2:23 PM  PATIENT:  Tyrone Sagelifton E Loewe  40 y.o. male  PRE-OPERATIVE DIAGNOSIS:  RIGHT URETERAL, RENAL STONE  POST-OPERATIVE DIAGNOSIS:  RIGHT URETERAL, RENAL STONE  PROCEDURE:  Procedure(s): CYSTOSCOPY WITH URETEROSCOPY AND STENT PLACEMENT (Right) HOLMIUM LASER APPLICATION (Right) CYSTOSCOPY WITH RETROGRADE PYELOGRAM (Right) STONE EXTRACTION WITH BASKET (Right)  SURGEON:  Surgeon(s) and Role:    * Sebastian Acheheodore Manasa Spease, MD - Primary  PHYSICIAN ASSISTANT:   ASSISTANTS: none   ANESTHESIA:   general  EBL:  Total I/O In: 800 [I.V.:800] Out: -   BLOOD ADMINISTERED:none  DRAINS: none   LOCAL MEDICATIONS USED:  NONE  SPECIMEN:  Source of Specimen:  Rt renal / ureteral stone fragments  DISPOSITION OF SPECIMEN:  Given to pt's family  COUNTS:  YES  TOURNIQUET:  * No tourniquets in log *  DICTATION: .Other Dictation: Dictation Number [id number]  PLAN OF CARE: Discharge to home after PACU  PATIENT DISPOSITION:  PACU - hemodynamically stable.   Delay start of Pharmacological VTE agent (>24hrs) due to surgical blood loss or risk of bleeding: not applicable

## 2015-08-20 NOTE — H&P (Signed)
[name] is an 40 y.o. male.    Chief Complaint: Pre-op RIGHT ureteroscopic stone manipulation  HPI:   1 - Recurrent Nephrolithiasis -   02/11/2012 - Rt PCNL for severely impacted rt proximal ureteral stone that failed prior URS.  Composition CaOx. First stone. 11/2012 - KUB stone free ==> did not keep follow up and non-compliant medical management 01/2015 - Rt PCNL for 1.8cm UPJ stone.   08/2015 - Rt flank pain, colickly with some nausea, feels similar to prior sotnes. No fevers. UCX negative. CT with 7mm Rt prox ureteral stone and 7mm lower pole stone and mod hydro.  2 - Metabolic Stone Disease / Hypercalciuria: 2013: BMP,PTH,Uric Acid - normal, 24 hr urine - high calcium + low volume, Composition - CaOx. ==> Indapamide 2.5 + vigorous hydration  3 - Recurrent Pyelonephritis - episode staph pyelo 2013 and again 2016 during episodes of obstructing stone. No h/o diabetes or other chronic infections.   Today [name] is seen to proceed with right ureteroscopic stone manipulaiton with  Goal of right side stone free. No iterval fevers. UCX from this week negative.    Past Medical History  Diagnosis Date  . Sarcoidosis (HCC)   . Asthma     last asthma attack last year in 2012  . Hearing impairment     wears hearing aids  . Wears glasses   . Allergy   . Renal stone   . Hypertension     off bp meds  last 2 years     Past Surgical History  Procedure Laterality Date  . None    . Esophageal dilation    . Cystoscopy/retrograde/ureteroscopy  01/12/2012    Procedure: CYSTOSCOPY/RETROGRADE/URETEROSCOPY;  Surgeon: Sebastian Ache, MD;  Location: [facility];  Service: Urology;  Laterality: Right;  . Nephrolithotomy  02/11/2012    Procedure: NEPHROLITHOTOMY PERCUTANEOUS;  Surgeon: Sebastian Ache, MD;  Location: [facility];  Service: Urology;  Laterality: Right;  . Nephrolithotomy Right 01/30/2015    Procedure: NEPHROLITHOTOMY PERCUTANEOUS, NEPHROSTOGRAM;  Surgeon: Sebastian Ache, MD;  Location: [facility];  Service: Urology;  Laterality: Right;  . Ureteroscopy  01/30/2015    Procedure: URETEROSCOPY WITH BASKETING;  Surgeon: Sebastian Ache, MD;  Location: [facility];  Service: Urology;;    Family History  Problem Relation Age of Onset  . Hypertension Mother   . Asthma Mother   . Diabetes Father   . Heart attack Father   . Cancer Paternal Grandmother   . Liver disease Sister    Social History:  reports that he quit smoking about 8 years ago. His smoking use included Cigarettes. He has a .4 pack-year smoking history. He quit smokeless tobacco use about 2 years ago. He reports that he does not drink alcohol or use illicit drugs.  Allergies: No Known Allergies  No prescriptions prior to admission    No results found for this or any previous visit (from the past 48 hour(s)). No results found.  Review of Systems  Constitutional: Negative.  Negative for fever and chills.  HENT: Negative.   Eyes: Negative.   Respiratory: Negative.   Cardiovascular: Negative.   Gastrointestinal: Negative.   Genitourinary: Negative.   Musculoskeletal: Negative.   Skin: Negative.   Neurological: Negative.   Endo/Heme/Allergies: Negative.   Psychiatric/Behavioral: Negative.     There were no vitals taken for this visit. Physical Exam  Constitutional: He is oriented to person, place, and time. He appears well-developed.  HENT:  Head: Normocephalic.  Eyes: Pupils are equal, round,  and reactive to light.  Neck: Normal range of motion.  Cardiovascular: Normal rate.   Respiratory: Effort normal.  GI: Soft.  Genitourinary:  Mild Rt CVAT  Musculoskeletal: Normal range of motion.  Neurological: He is alert and oriented to person, place, and time.  Skin: Skin is warm.  Psychiatric: He has a normal mood and affect. His behavior is normal. Judgment and thought content normal.     Assessment/Plan  1 - Surgical Nephrolithiasis - Now stone free again after 2nd PCNL. STRONGLY recommend medical  management and yearly imaging surveillance with goal of managing stones when small / not requiring such invasive approaches.   He now has multifocal Rt ureteral / renal stone. Rec URS next avail and he is in agreement.   We rediscussed ureteroscopic stone manipulation with basketing and laser-lithotripsy in detail.  We rediscussed risks including bleeding, infection, damage to kidney / ureter  bladder, rarely loss of kidney. We rediscussed anesthetic risks and rare but serious surgical complications including DVT, PE, MI, and mortality. We specifically readdressed that in 5-10% of cases a staged approach is required with stenting followed by re-attempt ureteroscopy if anatomy unfavorable. The patient voiced understanding and wises to proceed.    2 - Metabolic Stone Disease / Hypercalciuria - Recomeded restart  Indapamide 2.5mg  Daily + indcrease fluid intake. I also explained that this should help his BP some too. He voiced understanding.   3 - Recurrent Pyelonephritis - this has only been problematic during episodes of renal obstruction, do not favor any sort of chronic / supressive ABX. Most recent UCX negative. Proceed as planned today to prevent recurrent obsructive pyelo.   Sebastian AcheMANNY, Teandra Harlan, MD 08/20/2015, 6:40 AM

## 2015-08-20 NOTE — Anesthesia Postprocedure Evaluation (Signed)
Anesthesia Post Note  Patient: Tyrone SageClifton E Semple  Procedure(s) Performed: Procedure(s) (LRB): CYSTOSCOPY WITH URETEROSCOPY AND STENT PLACEMENT (Right) HOLMIUM LASER APPLICATION (Right) CYSTOSCOPY WITH RETROGRADE PYELOGRAM (Right) STONE EXTRACTION WITH BASKET (Right)  Patient location during evaluation: PACU Anesthesia Type: General Level of consciousness: sedated Pain management: pain level controlled Vital Signs Assessment: post-procedure vital signs reviewed and stable Respiratory status: spontaneous breathing and respiratory function stable Cardiovascular status: stable Anesthetic complications: no    Last Vitals:  Filed Vitals:   08/20/15 1515 08/20/15 1530  BP: 140/97 141/99  Pulse: 59 58  Temp:    Resp: 13 14    Last Pain:  Filed Vitals:   08/20/15 1532  PainSc: 7                  Tivon Lemoine DANIEL

## 2015-08-21 ENCOUNTER — Encounter (HOSPITAL_BASED_OUTPATIENT_CLINIC_OR_DEPARTMENT_OTHER): Payer: Self-pay | Admitting: Urology

## 2015-08-21 NOTE — Op Note (Signed)
NAME:  [name], [name]               ACCOUNT NO.:  [id number]  MEDICAL RECORD NO.:  [id number]  LOCATION:                                 FACILITY:  PHYSICIAN:  Sebastian Acheheodore Nola Botkins, MD     DATE OF BIRTH:  Oct 08, 1975  DATE OF PROCEDURE: 08/20/2015                               OPERATIVE REPORT  DIAGNOSES:  Right ureteral stone, renal stone with renal colic.  PROCEDURES: 1. Cystoscopy with right retrograde pyelogram and interpretation. 2. Right ureteroscopy with laser lithotripsy. 3. Insertion of right ureteral stent, 5 x 28, Polaris with tether.  ESTIMATED BLOOD LOSS:  Nil.  COMPLICATIONS:  None.  SPECIMENS:  Right ureteral and renal stone fragments for compositional analysis.  FINDINGS: 1. Mildly impacted proximal right ureteral stone.  This was retrograde     position into an upper pole calyx. 2. Nonobstructing right lower pole renal stone approximately 7 mm.     This was also repositioned into an upper pole calyx. 3. Complete resolution of all stone fragments larger than 1/3rd mm     following holmium laser lithotripsy and basket extraction. 4. Successful placement of right ureteral stent, proximal in renal     pelvis and distal in urinary bladder.  INDICATION:  Mr. [name] is a very pleasant 40 year old young man with history of impressive recurrent nephrolithiasis.  He is status post percutaneous procedure times several and has been fairly rapid in stone recurrence.  He has been variably compliant with stone prevention measures.  He presented with colicky flank pain as per his usual.  CT scan corroborated a proximal right ureteral stone as well as a right lower pole stone, fortunately these were much smaller than he has had previously, each at approximately 7 mm or so, this did appear amenable to minimally invasive approach.  Options were discussed including holmium laser lithotripsy, goal of stone free versus shockwave lithotripsy, dressing along the obstructing stone and he  wished to proceed with ureteroscopy with goal of stone free.  His most recent urine culture was negative.  Informed consent was obtained and placed in the medical record.  PROCEDURE IN DETAIL:  The patient being [name], was verified. Procedure being right ureteroscopic stone manipulation was confirmed. Procedure was carried out.  Time-out was performed.  Intravenous antibiotics were administered.  General anesthesia introduced, the patient was placed into a low lithotomy position and sterile field was created by prepping and draping the patient's penis, perineum, and proximal thighs using iodine x3.  Next, cystourethroscopy was performed using a 23-French rigid cystoscope with offset lens.  Inspection of the anterior and posterior urethra was unremarkable.  Inspection of the urinary bladder revealed no diverticula, calcifications, papillary lesions.  Ureteral orifices were singleton bilaterally.  The right ureteral orifice was cannulated with a 6-French end-hole catheter and right retrograde pyelogram was obtained.  Right retrograde pyelogram demonstrated a single right ureter with single-system right kidney.  There was filling defect at the proximal ureter consistent with known stone.  A 0.038 zip wire was then advanced at the level of the upper pole, which also had apparent affective repositioning the stone into the area of the renal pelvis.  This was set aside as  a safety wire.  An 8-French feeding tube placed in the urinary bladder for pressure release.  Next, semi-rigid ureteroscopy was performed to the distal two-thirds of the right ureter alongside a separate Sensor working wire.  No mucosal abnormalities were found.  The semi-rigid scope was exchanged for the medium length 12/14 ureteral access sheath over the Sensor working wire to the level of proximal ureter using continuous fluoroscopic guidance.  Next, flexible digital ureteroscopy was performed using a single  channel digital ureteroscope, which allowed inspection of the proximal right ureter and systematic inspection of the right kidney including all calices x2.  The previous UPJ stone had been repositioned into an upper pole calyx.  There was another lower pole stone as well.  Given angulation, it was somewhat unfavorable; therefore, an Escape basket was used to grasp the lower pole stone and repositioned into the upper pole calyx adjacent to the other stone.  Next, holmium laser energy applied to the stone using settings of 0.3 joules and 30 hertz fragmenting the stones approximately 2-3 mm pieces of each.  These were then sequentially grasped on their long axis and removed in their entirety, set aside for compositional analysis.  Following these maneuvers, there was complete resolution of all stone fragments larger than 1/3rd mm.  There was no evidence of renal perforation and hemostasis appeared excellent.  The access sheath was removed under continuous ureteroscopic vision.  No mucosal abnormalities were found.  Next, a 5 x 28 Polaris-type stent was placed over the remaining safety wire using fluoroscopic guidance.  Good proximal and distal deployment were noted.  The tether was left in place and fashioned the dorsum of the penis, procedure was terminated.  The patient tolerated the procedure well.  There were no immediate periprocedural complications.  The patient was taken to the postanesthesia care unit in stable condition.          ______________________________ Sebastian Ache, MD     TM/MEDQ  D:  08/20/2015  T:  08/21/2015  Job:  [id number]

## 2016-04-29 ENCOUNTER — Emergency Department (HOSPITAL_COMMUNITY): Payer: BLUE CROSS/BLUE SHIELD

## 2016-04-29 ENCOUNTER — Encounter (HOSPITAL_COMMUNITY): Payer: Self-pay

## 2016-04-29 ENCOUNTER — Emergency Department (HOSPITAL_COMMUNITY)
Admission: EM | Admit: 2016-04-29 | Discharge: 2016-04-30 | Disposition: A | Discharge status: Home / Self Care | Payer: BLUE CROSS/BLUE SHIELD | Attending: Emergency Medicine | Admitting: Emergency Medicine

## 2016-04-29 DIAGNOSIS — J45909 Unspecified asthma, uncomplicated: Secondary | ICD-10-CM | POA: Diagnosis not present

## 2016-04-29 DIAGNOSIS — S199XXA Unspecified injury of neck, initial encounter: Secondary | ICD-10-CM | POA: Insufficient documentation

## 2016-04-29 DIAGNOSIS — R109 Unspecified abdominal pain: Secondary | ICD-10-CM | POA: Insufficient documentation

## 2016-04-29 DIAGNOSIS — Z87891 Personal history of nicotine dependence: Secondary | ICD-10-CM | POA: Diagnosis not present

## 2016-04-29 DIAGNOSIS — S0990XA Unspecified injury of head, initial encounter: Secondary | ICD-10-CM | POA: Diagnosis not present

## 2016-04-29 DIAGNOSIS — Y92410 Unspecified street and highway as the place of occurrence of the external cause: Secondary | ICD-10-CM | POA: Diagnosis not present

## 2016-04-29 DIAGNOSIS — I10 Essential (primary) hypertension: Secondary | ICD-10-CM | POA: Diagnosis not present

## 2016-04-29 DIAGNOSIS — M549 Dorsalgia, unspecified: Secondary | ICD-10-CM | POA: Diagnosis not present

## 2016-04-29 DIAGNOSIS — S3993XA Unspecified injury of pelvis, initial encounter: Secondary | ICD-10-CM | POA: Diagnosis not present

## 2016-04-29 DIAGNOSIS — M5489 Other dorsalgia: Secondary | ICD-10-CM | POA: Diagnosis not present

## 2016-04-29 DIAGNOSIS — T1490XA Injury, unspecified, initial encounter: Secondary | ICD-10-CM

## 2016-04-29 DIAGNOSIS — Y939 Activity, unspecified: Secondary | ICD-10-CM | POA: Insufficient documentation

## 2016-04-29 DIAGNOSIS — M546 Pain in thoracic spine: Secondary | ICD-10-CM | POA: Insufficient documentation

## 2016-04-29 DIAGNOSIS — T148XXA Other injury of unspecified body region, initial encounter: Secondary | ICD-10-CM | POA: Diagnosis not present

## 2016-04-29 DIAGNOSIS — S3992XA Unspecified injury of lower back, initial encounter: Secondary | ICD-10-CM | POA: Diagnosis not present

## 2016-04-29 DIAGNOSIS — S299XXA Unspecified injury of thorax, initial encounter: Secondary | ICD-10-CM | POA: Diagnosis not present

## 2016-04-29 DIAGNOSIS — R938 Abnormal findings on diagnostic imaging of other specified body structures: Secondary | ICD-10-CM | POA: Diagnosis not present

## 2016-04-29 DIAGNOSIS — S3991XA Unspecified injury of abdomen, initial encounter: Secondary | ICD-10-CM | POA: Diagnosis not present

## 2016-04-29 DIAGNOSIS — J90 Pleural effusion, not elsewhere classified: Secondary | ICD-10-CM | POA: Diagnosis not present

## 2016-04-29 DIAGNOSIS — M545 Low back pain: Secondary | ICD-10-CM | POA: Diagnosis not present

## 2016-04-29 DIAGNOSIS — Y999 Unspecified external cause status: Secondary | ICD-10-CM | POA: Insufficient documentation

## 2016-04-29 DIAGNOSIS — M542 Cervicalgia: Secondary | ICD-10-CM | POA: Diagnosis not present

## 2016-04-29 LAB — BASIC METABOLIC PANEL
ANION GAP: 10 (ref 5–15)
BUN: 22 mg/dL — AB (ref 6–20)
CO2: 22 mmol/L (ref 22–32)
Calcium: 9.8 mg/dL (ref 8.9–10.3)
Chloride: 103 mmol/L (ref 101–111)
Creatinine, Ser: 1.61 mg/dL — ABNORMAL HIGH (ref 0.61–1.24)
GFR calc Af Amer: 59 mL/min — ABNORMAL LOW (ref >60)
GFR, EST NON AFRICAN AMERICAN: 51 mL/min — AB (ref >60)
Glucose, Bld: 73 mg/dL (ref 65–99)
POTASSIUM: 3.5 mmol/L (ref 3.5–5.1)
SODIUM: 135 mmol/L (ref 135–145)

## 2016-04-29 LAB — CBC
HCT: 39.2 % (ref 39.0–52.0)
Hemoglobin: 14 g/dL (ref 13.0–17.0)
MCH: 30.3 pg (ref 26.0–34.0)
MCHC: 35.7 g/dL (ref 30.0–36.0)
MCV: 84.8 fL (ref 78.0–100.0)
PLATELETS: 237 10*3/uL (ref 150–400)
RBC: 4.62 MIL/uL (ref 4.22–5.81)
RDW: 12.7 % (ref 11.5–15.5)
WBC: 7 10*3/uL (ref 4.0–10.5)

## 2016-04-29 MED ORDER — HYDROMORPHONE HCL 2 MG/ML IJ SOLN
1.0000 mg | INTRAMUSCULAR | Status: AC | PRN
  Administered 2016-04-29 (×2): 1 mg via INTRAVENOUS
  Filled 2016-04-29 (×4): qty 1

## 2016-04-29 MED ORDER — HYDROMORPHONE HCL 2 MG/ML IJ SOLN
1.5000 mg | Freq: Once | INTRAMUSCULAR | Status: AC
  Administered 2016-04-29: 1.5 mg via INTRAVENOUS

## 2016-04-29 MED ORDER — NAPROXEN 500 MG PO TABS: 500.0000 mg | ORAL_TABLET | Freq: Two times a day (BID) | ORAL | 0 refills | Status: DC

## 2016-04-29 MED ORDER — CYCLOBENZAPRINE HCL 10 MG PO TABS: 10.0000 mg | ORAL_TABLET | Freq: Two times a day (BID) | ORAL | 0 refills | Status: DC | PRN

## 2016-04-29 MED ORDER — ONDANSETRON HCL 4 MG/2ML IJ SOLN
INTRAMUSCULAR | Status: AC
  Filled 2016-04-29: qty 2

## 2016-04-29 MED ORDER — IOPAMIDOL (ISOVUE-300) INJECTION 61%
INTRAVENOUS | Status: AC
  Administered 2016-04-29: 100 mL
  Filled 2016-04-29: qty 100

## 2016-04-29 MED ORDER — HYDROMORPHONE HCL 2 MG/ML IJ SOLN
1.0000 mg | Freq: Once | INTRAMUSCULAR | Status: AC
  Administered 2016-04-29: 1 mg via INTRAVENOUS

## 2016-04-29 MED ORDER — SODIUM CHLORIDE 0.9 % IV SOLN
INTRAVENOUS | Status: DC
  Administered 2016-04-29: 20:00:00 via INTRAVENOUS

## 2016-04-29 MED ORDER — ONDANSETRON HCL 4 MG/2ML IJ SOLN
4.0000 mg | Freq: Once | INTRAMUSCULAR | Status: AC
  Administered 2016-04-29: 4 mg via INTRAVENOUS

## 2016-04-29 NOTE — ED Notes (Signed)
Pt back from CT attempting to use the urinal
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
## 2016-04-29 NOTE — ED Notes (Signed)
Pt in severe pain family at bedside ER MD aware orders received and carried out
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
## 2016-04-29 NOTE — ED Notes (Signed)
Pt. Able to stand and use the urinal appears in no distress

## 2016-04-29 NOTE — ED Notes (Signed)
Pt given paper scrubs and assisted to front waiting area by NT, in a wheelchair. Pt departed in NAD.

## 2016-04-29 NOTE — ED Notes (Signed)
Pt trembling in pain Pain med orders given as per order pt remains on monitor instructed not to sit up, pt c/o severe back pain

## 2016-04-29 NOTE — ED Notes (Signed)
Patient transported to CT 

## 2016-04-29 NOTE — Discharge Instructions (Signed)
Take the medications as needed for pain. Expect to be stiff and sore for the next week or so. Follow-up with a primary doctor if not improving in 1-2 weeks.
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
## 2016-04-29 NOTE — ED Notes (Signed)
[facility] pt remains immobilized by EMS

## 2016-04-30 ENCOUNTER — Ambulatory Visit (INDEPENDENT_AMBULATORY_CARE_PROVIDER_SITE_OTHER): Payer: BLUE CROSS/BLUE SHIELD | Admitting: Family Medicine

## 2016-04-30 ENCOUNTER — Encounter: Payer: Self-pay | Admitting: Family Medicine

## 2016-04-30 VITALS — BP 128/80 | HR 80 | Wt 184.4 lb

## 2016-04-30 DIAGNOSIS — S0990XA Unspecified injury of head, initial encounter: Secondary | ICD-10-CM

## 2016-04-30 DIAGNOSIS — M542 Cervicalgia: Secondary | ICD-10-CM | POA: Diagnosis not present

## 2016-04-30 NOTE — Patient Instructions (Signed)
Use ice for the first 48 hours after injury. Keep moving and stretching. You may be more sore tonight and tomorrow. Take the naproxen for pain control and muscle relaxant as needed for severe pain.  Follow up in 2 weeks.
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
## 2016-05-13 ENCOUNTER — Encounter: Payer: Self-pay | Admitting: Family Medicine

## 2016-05-13 ENCOUNTER — Ambulatory Visit (INDEPENDENT_AMBULATORY_CARE_PROVIDER_SITE_OTHER): Payer: BLUE CROSS/BLUE SHIELD | Admitting: Family Medicine

## 2016-05-13 VITALS — BP 150/98 | HR 96 | Temp 98.3°F | Wt 182.6 lb

## 2016-05-13 DIAGNOSIS — M545 Low back pain, unspecified: Secondary | ICD-10-CM

## 2016-05-13 DIAGNOSIS — M542 Cervicalgia: Secondary | ICD-10-CM | POA: Diagnosis not present

## 2016-05-13 DIAGNOSIS — R030 Elevated blood-pressure reading, without diagnosis of hypertension: Secondary | ICD-10-CM

## 2016-05-13 LAB — POCT URINALYSIS DIPSTICK
BILIRUBIN UA: NEGATIVE
Glucose, UA: NEGATIVE
KETONES UA: NEGATIVE
Leukocytes, UA: NEGATIVE
NITRITE UA: NEGATIVE
PH UA: 6
RBC UA: NEGATIVE
Urobilinogen, UA: NEGATIVE

## 2016-05-13 NOTE — Patient Instructions (Addendum)
The physical therapy clinic will call you.   I recommend you follow up with Dr. Maple Hudson, pulmonologist. Also, follow up with your urologist regarding kidney stones as needed.   Your blood pressure is elevated today. I would like for you to check this at home for the next 2 weeks and call me with the readings. Since you have a history of high blood pressure, we may need to start you back on medication. Let's see what your readings look like.  Follow up in 1 month with me.   DASH Eating Plan DASH stands for "Dietary Approaches to Stop Hypertension." The DASH eating plan is a healthy eating plan that has been shown to reduce high blood pressure (hypertension). Additional health benefits may include reducing the risk of type 2 diabetes mellitus, heart disease, and stroke. The DASH eating plan may also help with weight loss. What do I need to know about the DASH eating plan? For the DASH eating plan, you will follow these general guidelines:  Choose foods with less than 150 milligrams of sodium per serving (as listed on the food label).  Use salt-free seasonings or herbs instead of table salt or sea salt.  Check with your health care provider or pharmacist before using salt substitutes.  Eat lower-sodium products. These are often labeled as "low-sodium" or "no salt added."  Eat fresh foods. Avoid eating a lot of canned foods.  Eat more vegetables, fruits, and low-fat dairy products.  Choose whole grains. Look for the word "whole" as the first word in the ingredient list.  Choose fish and skinless chicken or [place] more often than red meat. Limit fish, poultry, and meat to 6 oz (170 g) each day.  Limit sweets, desserts, sugars, and sugary drinks.  Choose heart-healthy fats.  Eat more home-cooked food and less restaurant, buffet, and fast food.  Limit fried foods.  Do not fry foods. Cook foods using methods such as baking, boiling, grilling, and broiling instead.  When eating at a  restaurant, ask that your food be prepared with less salt, or no salt if possible. What foods can I eat? Seek help from a dietitian for individual calorie needs. Grains  Whole grain or whole wheat bread. Brown rice. Whole grain or whole wheat pasta. Quinoa, bulgur, and whole grain cereals. Low-sodium cereals. Corn or whole wheat flour tortillas. Whole grain cornbread. Whole grain crackers. Low-sodium crackers. Vegetables  Fresh or frozen vegetables (raw, steamed, roasted, or grilled). Low-sodium or reduced-sodium tomato and vegetable juices. Low-sodium or reduced-sodium tomato sauce and paste. Low-sodium or reduced-sodium canned vegetables. Fruits  All fresh, canned (in natural juice), or frozen fruits. Meat and Other Protein Products  Ground beef (85% or leaner), grass-fed beef, or beef trimmed of fat. Skinless chicken or [place]. Ground chicken or [place]. Pork trimmed of fat. All fish and seafood. Eggs. Dried beans, peas, or lentils. Unsalted nuts and seeds. Unsalted canned beans. Dairy  Low-fat dairy products, such as skim or 1% milk, 2% or reduced-fat cheeses, low-fat ricotta or cottage cheese, or plain low-fat yogurt. Low-sodium or reduced-sodium cheeses. Fats and Oils  Tub margarines without trans fats. Light or reduced-fat mayonnaise and salad dressings (reduced sodium). Avocado. Safflower, olive, or canola oils. Natural peanut or almond butter. Other  Unsalted popcorn and pretzels. The items listed above may not be a complete list of recommended foods or beverages. Contact your dietitian for more options.  What foods are not recommended? Grains  White bread. White pasta. White rice. Refined cornbread. Bagels and  croissants. Crackers that contain trans fat. Vegetables  Creamed or fried vegetables. Vegetables in a cheese sauce. Regular canned vegetables. Regular canned tomato sauce and paste. Regular tomato and vegetable juices. Fruits  Canned fruit in light or heavy syrup. Fruit  juice. Meat and Other Protein Products  Fatty cuts of meat. Ribs, chicken wings, bacon, sausage, bologna, salami, chitterlings, fatback, hot dogs, bratwurst, and packaged luncheon meats. Salted nuts and seeds. Canned beans with salt. Dairy  Whole or 2% milk, cream, half-and-half, and cream cheese. Whole-fat or sweetened yogurt. Full-fat cheeses or blue cheese. Nondairy creamers and whipped toppings. Processed cheese, cheese spreads, or cheese curds. Condiments  Onion and garlic salt, seasoned salt, table salt, and sea salt. Canned and packaged gravies. Worcestershire sauce. Tartar sauce. Barbecue sauce. Teriyaki sauce. Soy sauce, including reduced sodium. Steak sauce. Fish sauce. Oyster sauce. Cocktail sauce. Horseradish. Ketchup and mustard. Meat flavorings and tenderizers. Bouillon cubes. Hot sauce. Tabasco sauce. Marinades. Taco seasonings. Relishes. Fats and Oils  Butter, stick margarine, lard, shortening, ghee, and bacon fat. Coconut, palm kernel, or palm oils. Regular salad dressings. Other  Pickles and olives. Salted popcorn and pretzels. The items listed above may not be a complete list of foods and beverages to avoid. Contact your dietitian for more information.  Where can I find more information? National Heart, Lung, and Blood Institute: CablePromo.itwww.nhlbi.nih.gov/health/health-topics/topics/dash/ This information is not intended to replace advice given to you by your health care provider. Make sure you discuss any questions you have with your health care provider. Document Released: 03/11/2011 Document Revised: 08/28/2015 Document Reviewed: 01/24/2013 Elsevier Interactive Patient Education  2017 [employer]
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
## 2016-05-17 ENCOUNTER — Telehealth: Payer: Self-pay | Admitting: Family Medicine

## 2016-05-17 DIAGNOSIS — M6281 Muscle weakness (generalized): Secondary | ICD-10-CM | POA: Diagnosis not present

## 2016-05-17 DIAGNOSIS — M542 Cervicalgia: Secondary | ICD-10-CM | POA: Diagnosis not present

## 2016-05-17 DIAGNOSIS — M549 Dorsalgia, unspecified: Secondary | ICD-10-CM

## 2016-05-17 DIAGNOSIS — M545 Low back pain: Secondary | ICD-10-CM | POA: Diagnosis not present

## 2016-05-17 NOTE — Telephone Encounter (Signed)
Fiance called & states pt went to Physical therapy today and wants to know if he is released back to work.  She states he is still in a lot of pain and she doesn't think he should be going back to work yet but wants to know what you think.  Please call them and let them know.

## 2016-05-18 NOTE — Telephone Encounter (Signed)
Spoke with [name]-They would like to discuss this with lawyer before we make any referrals. [name]/[facility]

## 2016-05-18 NOTE — Telephone Encounter (Signed)
Pt's fiance called back and wanted a update. Per Vickie pt's PT report just came in and she will call after she looks at it. Please call Lasonda back at [phone]. She is on Pt's HIPAA

## 2016-05-18 NOTE — Telephone Encounter (Signed)
I did not take him out of work therefore I cannot say if he is unable to work or not.  I'm not sure if the provider in the ED took him out of work or not. I have not filled out any paper work regarding his work status. We will need to refer him to ortho to help us determine this. Please refer him to [facility] or his choice of orthopedists if he has seen someone in the past. Thanks.

## 2016-05-18 NOTE — Telephone Encounter (Signed)
Pt wanted a note to go back to work as his work won't let him go back until he has a note. He was advised because he is still having pain he needed to see an ortho for them to evaluate him and for them to determine if he can go back to work. I have put referral in to ortho

## 2016-05-21 ENCOUNTER — Ambulatory Visit (INDEPENDENT_AMBULATORY_CARE_PROVIDER_SITE_OTHER): Payer: BLUE CROSS/BLUE SHIELD | Admitting: Orthopaedic Surgery

## 2016-05-24 ENCOUNTER — Ambulatory Visit (INDEPENDENT_AMBULATORY_CARE_PROVIDER_SITE_OTHER): Payer: BLUE CROSS/BLUE SHIELD | Admitting: Orthopaedic Surgery

## 2016-05-24 DIAGNOSIS — M6281 Muscle weakness (generalized): Secondary | ICD-10-CM | POA: Diagnosis not present

## 2016-05-24 DIAGNOSIS — M542 Cervicalgia: Secondary | ICD-10-CM | POA: Diagnosis not present

## 2016-05-24 DIAGNOSIS — M545 Low back pain: Secondary | ICD-10-CM | POA: Diagnosis not present

## 2016-05-27 DIAGNOSIS — M6281 Muscle weakness (generalized): Secondary | ICD-10-CM | POA: Diagnosis not present

## 2016-05-27 DIAGNOSIS — M545 Low back pain: Secondary | ICD-10-CM | POA: Diagnosis not present

## 2016-05-27 DIAGNOSIS — M542 Cervicalgia: Secondary | ICD-10-CM | POA: Diagnosis not present

## 2016-05-31 ENCOUNTER — Other Ambulatory Visit: Payer: Self-pay | Admitting: Urology

## 2016-05-31 DIAGNOSIS — N183 Chronic kidney disease, stage 3 (moderate): Secondary | ICD-10-CM | POA: Diagnosis not present

## 2016-05-31 DIAGNOSIS — N200 Calculus of kidney: Secondary | ICD-10-CM | POA: Diagnosis not present

## 2016-06-10 ENCOUNTER — Ambulatory Visit: Payer: BLUE CROSS/BLUE SHIELD | Admitting: Family Medicine

## 2016-06-11 NOTE — Patient Instructions (Addendum)
Christian SageClifton E Jones  06/11/2016   Your procedure is scheduled on: 06/16/16  Report [facility]  [facility] 3rd floor to  Short Stay Center at 3:00 PM.  Call this number if you have problems the morning of surgery [phone]   Remember: ONLY 1 PERSON MAY GO WITH YOU TO SHORT STAY TO GET  READY MORNING OF YOUR SURGERY.   Do not eat food :After Midnight.  MAY HAVE CLEAR LIQUIDS FROM MIDNIGHT UP UNTIL 11 AM THEN NOTHING AFTER 1100 AM UNTIL AFTER SURGERY!     Take these medicines the morning of surgery with A SIP OF WATER: none                                You may not have any metal on your body including hair pins and              piercings  Do not wear jewelry, make-up, lotions, powders or perfumes, deodorant             Do not wear nail polish.  Do not shave  48 hours prior to surgery.              Men may shave face and neck.   Do not bring valuables to the hospital. [facility] IS NOT             RESPONSIBLE   FOR VALUABLES.  Contacts, dentures or bridgework may not be worn into surgery.  Leave suitcase in the car. After surgery it may be brought to your room.     Patients discharged the day of surgery will not be allowed to drive home.  Name and phone number of your driver:spouseWest Carbo- [name]  Special Instructions: N/A              Please read over the following fact sheets you were given: _____________________________________________________________________             [facility] - Preparing for Surgery Before surgery, you can play an important role.  Because skin is not sterile, your skin needs to be as free of germs as possible.  You can reduce the number of germs on your skin by washing with CHG (chlorahexidine gluconate) soap before surgery.  CHG is an antiseptic cleaner which kills germs and bonds with the skin to continue killing germs even after washing. Please DO NOT use if you have an allergy to CHG or antibacterial  soaps.  If your skin becomes reddened/irritated stop using the CHG and inform your nurse when you arrive at Short Stay. Do not shave (including legs and underarms) for at least 48 hours prior to the first CHG shower.  You may shave your face/neck. Please follow these instructions carefully:  1.  Shower with CHG Soap the night before surgery and the  morning of Surgery.  2.  If you choose to wash your hair, wash your hair first as usual with your  normal  shampoo.  3.  After you shampoo, rinse your hair and body thoroughly to remove the  shampoo.                           4.  Use CHG as you would any other liquid soap.  You can apply chg  directly  to the skin and wash                       Gently with a scrungie or clean washcloth.  5.  Apply the CHG Soap to your body ONLY FROM THE NECK DOWN.   Do not use on face/ open                           Wound or open sores. Avoid contact with eyes, ears mouth and genitals (private parts).                       Wash face,  Genitals (private parts) with your normal soap.             6.  Wash thoroughly, paying special attention to the area where your surgery  will be performed.  7.  Thoroughly rinse your body with warm water from the neck down.  8.  DO NOT shower/wash with your normal soap after using and rinsing off  the CHG Soap.                9.  Pat yourself dry with a clean towel.            10.  Wear clean pajamas.            11.  Place clean sheets on your bed the night of your first shower and do not  sleep with pets. Day of Surgery : Do not apply any lotions/deodorants the morning of surgery.  Please wear clean clothes to the hospital/surgery center.  FAILURE TO FOLLOW THESE INSTRUCTIONS MAY RESULT IN THE CANCELLATION OF YOUR SURGERY PATIENT SIGNATURE_________________________________  NURSE SIGNATURE__________________________________  ________________________________________________________________________    CLEAR LIQUID DIET   Foods  Allowed                                                                     Foods Excluded  Coffee and tea, regular and decaf                             liquids that you cannot  Plain Jell-O in any flavor                                             see through such as: Fruit ices (not with fruit pulp)                                     milk, soups, orange juice  Iced Popsicles                                    All solid food Carbonated beverages, regular and diet  Cranberry, grape and apple juices Sports drinks like Gatorade Lightly seasoned clear broth or consume(fat free) Sugar, honey syrup  Sample Menu Breakfast                                Lunch                                     Supper Cranberry juice                    Beef broth                            Chicken broth Jell-O                                     Grape juice                           Apple juice Coffee or tea                        Jell-O                                      Popsicle                                                Coffee or tea                        Coffee or tea  _____________________________________________________________________

## 2016-06-14 ENCOUNTER — Encounter (HOSPITAL_COMMUNITY)
Admission: RE | Admit: 2016-06-14 | Discharge: 2016-06-14 | Disposition: A | Discharge status: Home / Self Care | Payer: BLUE CROSS/BLUE SHIELD | Source: Ambulatory Visit | Attending: Urology | Admitting: Urology

## 2016-06-14 ENCOUNTER — Encounter (HOSPITAL_COMMUNITY): Payer: Self-pay

## 2016-06-14 DIAGNOSIS — N200 Calculus of kidney: Secondary | ICD-10-CM | POA: Diagnosis not present

## 2016-06-14 DIAGNOSIS — H903 Sensorineural hearing loss, bilateral: Secondary | ICD-10-CM | POA: Diagnosis not present

## 2016-06-14 DIAGNOSIS — N183 Chronic kidney disease, stage 3 (moderate): Secondary | ICD-10-CM | POA: Diagnosis not present

## 2016-06-14 DIAGNOSIS — Z87891 Personal history of nicotine dependence: Secondary | ICD-10-CM | POA: Diagnosis not present

## 2016-06-14 DIAGNOSIS — Z8709 Personal history of other diseases of the respiratory system: Secondary | ICD-10-CM | POA: Diagnosis not present

## 2016-06-14 HISTORY — DX: Personal history of urinary calculi: Z87.442

## 2016-06-14 LAB — CBC
HCT: 40.8 % (ref 39.0–52.0)
HEMOGLOBIN: 14.4 g/dL (ref 13.0–17.0)
MCH: 29.6 pg (ref 26.0–34.0)
MCHC: 35.3 g/dL (ref 30.0–36.0)
MCV: 84 fL (ref 78.0–100.0)
Platelets: 237 10*3/uL (ref 150–400)
RBC: 4.86 MIL/uL (ref 4.22–5.81)
RDW: 13 % (ref 11.5–15.5)
WBC: 4.3 10*3/uL (ref 4.0–10.5)

## 2016-06-14 LAB — BASIC METABOLIC PANEL
ANION GAP: 6 (ref 5–15)
BUN: 12 mg/dL (ref 6–20)
CALCIUM: 9.9 mg/dL (ref 8.9–10.3)
CO2: 26 mmol/L (ref 22–32)
Chloride: 106 mmol/L (ref 101–111)
Creatinine, Ser: 1.41 mg/dL — ABNORMAL HIGH (ref 0.61–1.24)
Glucose, Bld: 89 mg/dL (ref 65–99)
Potassium: 4.1 mmol/L (ref 3.5–5.1)
Sodium: 138 mmol/L (ref 135–145)

## 2016-06-16 ENCOUNTER — Ambulatory Visit (HOSPITAL_COMMUNITY): Payer: BLUE CROSS/BLUE SHIELD | Admitting: Anesthesiology

## 2016-06-16 ENCOUNTER — Encounter (HOSPITAL_COMMUNITY): Payer: Self-pay | Admitting: *Deleted

## 2016-06-16 ENCOUNTER — Ambulatory Visit (HOSPITAL_COMMUNITY)
Admission: RE | Admit: 2016-06-16 | Discharge: 2016-06-16 | Disposition: A | Discharge status: Home / Self Care | Payer: BLUE CROSS/BLUE SHIELD | Source: Ambulatory Visit | Attending: Urology | Admitting: Urology

## 2016-06-16 ENCOUNTER — Encounter (HOSPITAL_COMMUNITY)
Admission: RE | Disposition: A | Discharge status: Home / Self Care | Payer: Self-pay | Source: Ambulatory Visit | Attending: Urology

## 2016-06-16 DIAGNOSIS — R030 Elevated blood-pressure reading, without diagnosis of hypertension: Secondary | ICD-10-CM | POA: Diagnosis not present

## 2016-06-16 DIAGNOSIS — Z87891 Personal history of nicotine dependence: Secondary | ICD-10-CM | POA: Diagnosis not present

## 2016-06-16 DIAGNOSIS — Z8709 Personal history of other diseases of the respiratory system: Secondary | ICD-10-CM | POA: Insufficient documentation

## 2016-06-16 DIAGNOSIS — D869 Sarcoidosis, unspecified: Secondary | ICD-10-CM | POA: Diagnosis not present

## 2016-06-16 DIAGNOSIS — N183 Chronic kidney disease, stage 3 (moderate): Secondary | ICD-10-CM | POA: Diagnosis not present

## 2016-06-16 DIAGNOSIS — N132 Hydronephrosis with renal and ureteral calculous obstruction: Secondary | ICD-10-CM | POA: Diagnosis not present

## 2016-06-16 DIAGNOSIS — N200 Calculus of kidney: Secondary | ICD-10-CM | POA: Diagnosis not present

## 2016-06-16 DIAGNOSIS — H903 Sensorineural hearing loss, bilateral: Secondary | ICD-10-CM | POA: Diagnosis not present

## 2016-06-16 HISTORY — PX: CYSTOSCOPY/URETEROSCOPY/HOLMIUM LASER/STENT PLACEMENT: SHX6546

## 2016-06-16 SURGERY — CYSTOSCOPY/URETEROSCOPY/HOLMIUM LASER/STENT PLACEMENT
Anesthesia: General | Site: Ureter | Laterality: Left

## 2016-06-16 MED ORDER — OXYCODONE-ACETAMINOPHEN 5-325 MG PO TABS
1.0000 | ORAL_TABLET | Freq: Four times a day (QID) | ORAL | Status: DC | PRN
  Administered 2016-06-16: 1 via ORAL
  Filled 2016-06-16: qty 1

## 2016-06-16 MED ORDER — LIDOCAINE 2% (20 MG/ML) 5 ML SYRINGE
INTRAMUSCULAR | Status: DC | PRN
  Administered 2016-06-16: 100 mg via INTRAVENOUS

## 2016-06-16 MED ORDER — CEFTRIAXONE SODIUM 2 G IJ SOLR
2.0000 g | INTRAMUSCULAR | Status: AC
  Administered 2016-06-16: 2 g via INTRAVENOUS
  Filled 2016-06-16 (×2): qty 2

## 2016-06-16 MED ORDER — PROPOFOL 10 MG/ML IV BOLUS
INTRAVENOUS | Status: AC
  Filled 2016-06-16: qty 20

## 2016-06-16 MED ORDER — KETOROLAC TROMETHAMINE 10 MG PO TABS
10.0000 mg | ORAL_TABLET | Freq: Three times a day (TID) | ORAL | Status: DC | PRN
  Administered 2016-06-16: 10 mg via ORAL
  Filled 2016-06-16 (×2): qty 1

## 2016-06-16 MED ORDER — HYDROMORPHONE HCL 2 MG/ML IJ SOLN
INTRAMUSCULAR | Status: AC
  Filled 2016-06-16: qty 1

## 2016-06-16 MED ORDER — FENTANYL CITRATE (PF) 100 MCG/2ML IJ SOLN
INTRAMUSCULAR | Status: AC
  Filled 2016-06-16: qty 2

## 2016-06-16 MED ORDER — MEPERIDINE HCL 50 MG/ML IJ SOLN: 6.2500 mg | INTRAMUSCULAR | Status: DC | PRN

## 2016-06-16 MED ORDER — LACTATED RINGERS IV SOLN
INTRAVENOUS | Status: DC
  Administered 2016-06-16: 16:00:00 via INTRAVENOUS

## 2016-06-16 MED ORDER — MIDAZOLAM HCL 5 MG/5ML IJ SOLN
INTRAMUSCULAR | Status: DC | PRN
  Administered 2016-06-16: 2 mg via INTRAVENOUS

## 2016-06-16 MED ORDER — KETOROLAC TROMETHAMINE 10 MG PO TABS: 10.0000 mg | ORAL_TABLET | Freq: Three times a day (TID) | ORAL | 1 refills | Status: DC | PRN

## 2016-06-16 MED ORDER — SODIUM CHLORIDE 0.9 % IR SOLN
Status: DC | PRN
  Administered 2016-06-16: 3000 mL via INTRAVESICAL
  Administered 2016-06-16: 1000 mL via INTRAVESICAL

## 2016-06-16 MED ORDER — MIDAZOLAM HCL 2 MG/2ML IJ SOLN
INTRAMUSCULAR | Status: AC
  Filled 2016-06-16: qty 2

## 2016-06-16 MED ORDER — SODIUM CHLORIDE 0.9 % IV SOLN
INTRAVENOUS | Status: DC | PRN
  Administered 2016-06-16: 5 mL

## 2016-06-16 MED ORDER — DEXAMETHASONE SODIUM PHOSPHATE 10 MG/ML IJ SOLN
INTRAMUSCULAR | Status: AC
  Filled 2016-06-16: qty 1

## 2016-06-16 MED ORDER — ONDANSETRON HCL 4 MG/2ML IJ SOLN
INTRAMUSCULAR | Status: DC | PRN
  Administered 2016-06-16: 4 mg via INTRAVENOUS

## 2016-06-16 MED ORDER — ONDANSETRON HCL 4 MG/2ML IJ SOLN
INTRAMUSCULAR | Status: AC
  Filled 2016-06-16: qty 2

## 2016-06-16 MED ORDER — FENTANYL CITRATE (PF) 100 MCG/2ML IJ SOLN
INTRAMUSCULAR | Status: DC | PRN
  Administered 2016-06-16: 100 ug via INTRAVENOUS

## 2016-06-16 MED ORDER — SENNOSIDES-DOCUSATE SODIUM 8.6-50 MG PO TABS: 1.0000 | ORAL_TABLET | Freq: Two times a day (BID) | ORAL | 0 refills | Status: DC

## 2016-06-16 MED ORDER — PROMETHAZINE HCL 25 MG/ML IJ SOLN: 6.2500 mg | INTRAMUSCULAR | Status: DC | PRN

## 2016-06-16 MED ORDER — OXYCODONE-ACETAMINOPHEN 5-325 MG PO TABS: 1.0000 | ORAL_TABLET | Freq: Four times a day (QID) | ORAL | 0 refills | Status: DC | PRN

## 2016-06-16 MED ORDER — DEXAMETHASONE SODIUM PHOSPHATE 10 MG/ML IJ SOLN
INTRAMUSCULAR | Status: DC | PRN
  Administered 2016-06-16: 10 mg via INTRAVENOUS

## 2016-06-16 MED ORDER — HYDROMORPHONE HCL 1 MG/ML IJ SOLN
0.2500 mg | INTRAMUSCULAR | Status: DC | PRN
  Administered 2016-06-16 (×2): 0.5 mg via INTRAVENOUS

## 2016-06-16 MED ORDER — PROPOFOL 10 MG/ML IV BOLUS
INTRAVENOUS | Status: DC | PRN
  Administered 2016-06-16: 200 mg via INTRAVENOUS

## 2016-06-16 SURGICAL SUPPLY — 23 items
BAG URO CATCHER STRL LF (MISCELLANEOUS) ×2 IMPLANT
BASKET LASER NITINOL 1.9FR (BASKET) IMPLANT
CATH INTERMIT  6FR 70CM (CATHETERS) ×2 IMPLANT
CLOTH BEACON ORANGE TIMEOUT ST (SAFETY) ×2 IMPLANT
FIBER LASER FLEXIVA 1000 (UROLOGICAL SUPPLIES) IMPLANT
FIBER LASER FLEXIVA 365 (UROLOGICAL SUPPLIES) IMPLANT
FIBER LASER FLEXIVA 550 (UROLOGICAL SUPPLIES) IMPLANT
FIBER LASER TRAC TIP (UROLOGICAL SUPPLIES) IMPLANT
GLOVE BIOGEL M STRL SZ7.5 (GLOVE) ×2 IMPLANT
GOWN STRL REUS W/TWL LRG LVL3 (GOWN DISPOSABLE) ×4 IMPLANT
GUIDEWIRE ANG ZIPWIRE 038X150 (WIRE) ×2 IMPLANT
GUIDEWIRE STR DUAL SENSOR (WIRE) ×2 IMPLANT
IV NS 1000ML (IV SOLUTION) ×1
IV NS 1000ML BAXH (IV SOLUTION) ×1 IMPLANT
MANIFOLD NEPTUNE II (INSTRUMENTS) ×2 IMPLANT
PACK CYSTO (CUSTOM PROCEDURE TRAY) ×2 IMPLANT
SHEATH ACCESS URETERAL 24CM (SHEATH) IMPLANT
SHEATH ACCESS URETERAL 38CM (SHEATH) IMPLANT
SHEATH ACCESS URETERAL 54CM (SHEATH) IMPLANT
STENT POLARIS 5FRX26 (STENTS) ×2 IMPLANT
SYR CONTROL 10ML LL (SYRINGE) ×2 IMPLANT
TUBE FEEDING 8FR 16IN STR KANG (MISCELLANEOUS) ×2 IMPLANT
TUBING CONNECTING 10 (TUBING) ×2 IMPLANT

## 2016-06-16 NOTE — Transfer of Care (Signed)
Immediate Anesthesia Transfer of Care Note  Patient: [name]  Procedure(s) Performed: Procedure(s): STAGE ONE-CYSTOSCOPY/DIAGNOSTIC URETEROSCOPY/RETROGRADE/STENT PLACEMENT (Left)  Patient Location: PACU  Anesthesia Type:General  Level of Consciousness: awake, alert  and oriented  Airway & Oxygen Therapy: Patient Spontanous Breathing and Patient connected to face mask oxygen  Post-op Assessment: Report given to RN and Post -op Vital signs reviewed and stable  Post vital signs: Reviewed and stable  Last Vitals:  Vitals:   06/16/16 1504 06/16/16 1530  BP: (!) 148/110 (!) 139/96  Pulse: 79   Resp: 18   Temp: 36.5 C     Last Pain:  Vitals:   06/16/16 1504  TempSrc: Oral      Patients Stated Pain Goal: 3 (06/16/16 1557)  Complications: No apparent anesthesia complications

## 2016-06-16 NOTE — Anesthesia Preprocedure Evaluation (Signed)
Anesthesia Evaluation  Patient identified by MRN, date of birth, ID band Patient awake    Reviewed: Allergy & Precautions, NPO status , Patient's Chart, lab work & pertinent test results  Airway Mallampati: II  TM Distance: >3 FB Neck ROM: Full    Dental no notable dental hx. (+) Teeth Intact, Dental Advisory Given   Pulmonary asthma , former smoker,  H/O sarcoidosis.  CXR stable, chronic changes.   Pulmonary exam normal        Cardiovascular hypertension, negative cardio ROS Normal cardiovascular exam     Neuro/Psych negative neurological ROS  negative psych ROS   GI/Hepatic negative GI ROS, Neg liver ROS,   Endo/Other  negative endocrine ROS  Renal/GU Renal InsufficiencyRenal diseaseElevated creatinine is improving. Cr 1.44. K 3.6  negative genitourinary   Musculoskeletal negative musculoskeletal ROS (+)   Abdominal   Peds negative pediatric ROS (+)  Hematology negative hematology ROS (+)   Anesthesia Other Findings   Reproductive/Obstetrics negative OB ROS                            Anesthesia Physical  Anesthesia Plan  ASA: II  Anesthesia Plan: General   Post-op Pain Management:    Induction: Intravenous  Airway Management Planned: LMA  Additional Equipment:   Intra-op Plan:   Post-operative Plan: Extubation in OR  Informed Consent: I have reviewed the patients History and Physical, chart, labs and discussed the procedure including the risks, benefits and alternatives for the proposed anesthesia with the patient or authorized representative who has indicated his/her understanding and acceptance.   Dental advisory given  Plan Discussed with: CRNA and Anesthesiologist  Anesthesia Plan Comments:         Anesthesia Quick Evaluation

## 2016-06-16 NOTE — Discharge Instructions (Signed)
General Anesthesia, Adult, Care After °These instructions provide you with information about caring for yourself after your procedure. Your health care provider may also give you more specific instructions. Your treatment has been planned according to current medical practices, but problems sometimes occur. Call your health care provider if you have any problems or questions after your procedure. °What can I expect after the procedure? °After the procedure, it is common to have: °· Vomiting. °· A sore throat. °· Mental slowness. °It is common to feel: °· Nauseous. °· Cold or shivery. °· Sleepy. °· Tired. °· Sore or achy, even in parts of your body where you did not have surgery. °Follow these instructions at home: °For at least 24 hours after the procedure: °· Do not: °¨ Participate in activities where you could fall or become injured. °¨ Drive. °¨ Use heavy machinery. °¨ Drink alcohol. °¨ Take sleeping pills or medicines that cause drowsiness. °¨ Make important decisions or sign legal documents. °¨ Take care of children on your own. °· Rest. °Eating and drinking °· If you vomit, drink water, juice, or soup when you can drink without vomiting. °· Drink enough fluid to keep your urine clear or pale yellow. °· Make sure you have little or no nausea before eating solid foods. °· Follow the diet recommended by your health care provider. °General instructions °· Have a responsible adult stay with you until you are awake and alert. °· Return to your normal activities as told by your health care provider. Ask your health care provider what activities are safe for you. °· Take over-the-counter and prescription medicines only as told by your health care provider. °· If you smoke, do not smoke without supervision. °· Keep all follow-up visits as told by your health care provider. This is important. °Contact a health care provider if: °· You continue to have nausea or vomiting at home, and medicines are not helpful. °· You  cannot drink fluids or start eating again. °· You cannot urinate after 8-12 hours. °· You develop a skin rash. °· You have fever. °· You have increasing redness at the site of your procedure. °Get help right away if: °· You have difficulty breathing. °· You have chest pain. °· You have unexpected bleeding. °· You feel that you are having a life-threatening or urgent problem. °This information is not intended to replace advice given to you by your health care provider. Make sure you discuss any questions you have with your health care provider. °Document Released: 06/28/2000 Document Revised: 08/25/2015 Document Reviewed: 03/06/2015 °Elsevier Interactive Patient Education © 2017 [employer] ° ° ° °1 - You may have urinary urgency (bladder spasms) and bloody urine on / off with stent in place. This is normal. ° °2 - Call MD or go to ER for fever >102, severe pain / nausea / vomiting not relieved by medications, or acute change in medical status ° °
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
## 2016-06-16 NOTE — Anesthesia Preprocedure Evaluation (Signed)
Anesthesia Evaluation  Patient identified by MRN, date of birth, ID band Patient awake    Reviewed: Allergy & Precautions, NPO status , Patient's Chart, lab work & pertinent test results  Airway Mallampati: II  TM Distance: >3 FB Neck ROM: Full    Dental no notable dental hx. (+) Teeth Intact, Dental Advisory Given   Pulmonary asthma , former smoker,  H/O sarcoidosis.  CXR stable, chronic changes.   Pulmonary exam normal        Cardiovascular hypertension, Normal cardiovascular exam Rhythm:Regular Rate:Normal     Neuro/Psych negative neurological ROS  negative psych ROS   GI/Hepatic negative GI ROS, Neg liver ROS,   Endo/Other  negative endocrine ROS  Renal/GU Renal InsufficiencyRenal diseaseElevated creatinine is improving. Cr 1.44. K 3.6     Musculoskeletal negative musculoskeletal ROS (+)   Abdominal   Peds  Hematology negative hematology ROS (+)   Anesthesia Other Findings   Reproductive/Obstetrics negative OB ROS                             Anesthesia Physical  Anesthesia Plan  ASA: II  Anesthesia Plan: General   Post-op Pain Management:    Induction: Intravenous  Airway Management Planned: LMA  Additional Equipment:   Intra-op Plan:   Post-operative Plan: Extubation in OR  Informed Consent: I have reviewed the patients History and Physical, chart, labs and discussed the procedure including the risks, benefits and alternatives for the proposed anesthesia with the patient or authorized representative who has indicated his/her understanding and acceptance.   Dental advisory given  Plan Discussed with: CRNA and Anesthesiologist  Anesthesia Plan Comments:         Anesthesia Quick Evaluation

## 2016-06-16 NOTE — Anesthesia Postprocedure Evaluation (Signed)
Anesthesia Post Note  Patient: [name]  Procedure(s) Performed: Procedure(s) (LRB): STAGE ONE-CYSTOSCOPY/DIAGNOSTIC URETEROSCOPY/RETROGRADE/STENT PLACEMENT (Left)  Patient location during evaluation: PACU Anesthesia Type: General Level of consciousness: sedated and patient cooperative Pain management: pain level controlled Vital Signs Assessment: post-procedure vital signs reviewed and stable Respiratory status: spontaneous breathing Cardiovascular status: stable Anesthetic complications: no       Last Vitals:  Vitals:   06/16/16 1823 06/16/16 1906  BP: (!) 164/105 (!) 166/107  Pulse: 69 74  Resp: 16 18  Temp: 36.4 C 36.4 C    Last Pain:  Vitals:   06/16/16 1906  TempSrc: Oral  PainSc: 4                  Lewie LoronJohn Diahn Waidelich

## 2016-06-16 NOTE — Progress Notes (Signed)
[name] had surgery on 06/16/16. Please excuse him from work on 06/17/16. Please excuse him on 06/18/16 if he needs it.  Thank you,   Pauline AusPam Deedee Lybarger RN [phone]

## 2016-06-16 NOTE — Brief Op Note (Signed)
06/16/2016  5:31 PM  PATIENT:  [name]  41 y.o. male  PRE-OPERATIVE DIAGNOSIS:  LEFT KIDNEY STONE  POST-OPERATIVE DIAGNOSIS:  LEFT KIDNEY STONE  PROCEDURE:  Procedure(s): STAGE ONE-CYSTOSCOPY/DIAGNOSTIC URETEROSCOPY/RETROGRADE/STENT PLACEMENT (Left)  SURGEON:  Surgeon(s) and Role:    * Sebastian Acheheodore Adetokunbo Mccadden, MD - Primary  PHYSICIAN ASSISTANT:   ASSISTANTS: none   ANESTHESIA:   general  EBL:  No intake/output data recorded.  BLOOD ADMINISTERED:none  DRAINS: none   LOCAL MEDICATIONS USED:  NONE  SPECIMEN:  No Specimen  DISPOSITION OF SPECIMEN:  N/A  COUNTS:  YES  TOURNIQUET:  * No tourniquets in log *  DICTATION: .Other Dictation: Dictation Number [id number]  PLAN OF CARE: Discharge to home after PACU  PATIENT DISPOSITION:  PACU - hemodynamically stable.   Delay start of Pharmacological VTE agent (>24hrs) due to surgical blood loss or risk of bleeding: yes

## 2016-06-16 NOTE — Anesthesia Procedure Notes (Signed)
Procedure Name: LMA Insertion Date/Time: 06/16/2016 5:11 PM Performed by: Enriqueta ShutterWILLIFORD, [name] Pre-anesthesia Checklist: Patient identified, Emergency Drugs available, Suction available and Patient being monitored Patient Re-evaluated:Patient Re-evaluated prior to inductionOxygen Delivery Method: Circle system utilized Preoxygenation: Pre-oxygenation with 100% oxygen Intubation Type: IV induction Ventilation: Mask ventilation without difficulty LMA Size: 4.0 Tube type: Oral Number of attempts: 1 Placement Confirmation: ETT inserted through vocal cords under direct vision,  positive ETCO2 and breath sounds checked- equal and bilateral Tube secured with: Tape Dental Injury: Teeth and Oropharynx as per pre-operative assessment

## 2016-06-17 ENCOUNTER — Encounter (HOSPITAL_COMMUNITY): Payer: Self-pay | Admitting: Urology

## 2016-06-17 NOTE — Op Note (Deleted)
  The note originally documented on this encounter has been moved the the encounter in which it belongs.  
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
## 2016-06-23 ENCOUNTER — Encounter (HOSPITAL_BASED_OUTPATIENT_CLINIC_OR_DEPARTMENT_OTHER): Payer: Self-pay | Admitting: *Deleted

## 2016-06-23 NOTE — Progress Notes (Signed)
[facility],instructed Npo solids after Mn-clear liquids(no dairy, pulp) until 0630,then Npo all.

## 2016-06-30 ENCOUNTER — Ambulatory Visit (HOSPITAL_BASED_OUTPATIENT_CLINIC_OR_DEPARTMENT_OTHER)
Admission: RE | Admit: 2016-06-30 | Discharge: 2016-06-30 | Disposition: A | Discharge status: Home / Self Care | Payer: BLUE CROSS/BLUE SHIELD | Source: Ambulatory Visit | Attending: Urology | Admitting: Urology

## 2016-06-30 ENCOUNTER — Encounter (HOSPITAL_BASED_OUTPATIENT_CLINIC_OR_DEPARTMENT_OTHER)
Admission: RE | Disposition: A | Discharge status: Home / Self Care | Payer: Self-pay | Source: Ambulatory Visit | Attending: Urology

## 2016-06-30 ENCOUNTER — Ambulatory Visit (HOSPITAL_BASED_OUTPATIENT_CLINIC_OR_DEPARTMENT_OTHER): Payer: BLUE CROSS/BLUE SHIELD | Admitting: Anesthesiology

## 2016-06-30 ENCOUNTER — Encounter (HOSPITAL_BASED_OUTPATIENT_CLINIC_OR_DEPARTMENT_OTHER): Payer: Self-pay

## 2016-06-30 DIAGNOSIS — N200 Calculus of kidney: Secondary | ICD-10-CM | POA: Insufficient documentation

## 2016-06-30 DIAGNOSIS — Z87891 Personal history of nicotine dependence: Secondary | ICD-10-CM | POA: Insufficient documentation

## 2016-06-30 DIAGNOSIS — Z466 Encounter for fitting and adjustment of urinary device: Secondary | ICD-10-CM | POA: Diagnosis not present

## 2016-06-30 DIAGNOSIS — I10 Essential (primary) hypertension: Secondary | ICD-10-CM | POA: Insufficient documentation

## 2016-06-30 DIAGNOSIS — D869 Sarcoidosis, unspecified: Secondary | ICD-10-CM | POA: Insufficient documentation

## 2016-06-30 DIAGNOSIS — N289 Disorder of kidney and ureter, unspecified: Secondary | ICD-10-CM | POA: Insufficient documentation

## 2016-06-30 DIAGNOSIS — N132 Hydronephrosis with renal and ureteral calculous obstruction: Secondary | ICD-10-CM | POA: Diagnosis not present

## 2016-06-30 DIAGNOSIS — R030 Elevated blood-pressure reading, without diagnosis of hypertension: Secondary | ICD-10-CM | POA: Diagnosis not present

## 2016-06-30 HISTORY — PX: CYSTOSCOPY/URETEROSCOPY/HOLMIUM LASER/STENT PLACEMENT: SHX6546

## 2016-06-30 SURGERY — CYSTOSCOPY/URETEROSCOPY/HOLMIUM LASER/STENT PLACEMENT
Anesthesia: General | Laterality: Left

## 2016-06-30 MED ORDER — DEXTROSE 5 % IV SOLN
INTRAVENOUS | Status: AC
Start: 2016-06-30 — End: 2016-06-30
  Filled 2016-06-30: qty 50

## 2016-06-30 MED ORDER — IOHEXOL 300 MG/ML  SOLN
INTRAMUSCULAR | Status: DC | PRN
  Administered 2016-06-30: 18 mL via URETHRAL

## 2016-06-30 MED ORDER — PROPOFOL 10 MG/ML IV BOLUS
INTRAVENOUS | Status: DC | PRN
  Administered 2016-06-30: 200 mg via INTRAVENOUS

## 2016-06-30 MED ORDER — DEXTROSE 5 % IV SOLN
1.0000 g | INTRAVENOUS | Status: AC
  Administered 2016-06-30: 1 g via INTRAVENOUS
  Filled 2016-06-30: qty 10

## 2016-06-30 MED ORDER — FENTANYL CITRATE (PF) 100 MCG/2ML IJ SOLN
INTRAMUSCULAR | Status: DC | PRN
  Administered 2016-06-30 (×2): 50 ug via INTRAVENOUS

## 2016-06-30 MED ORDER — CEPHALEXIN 500 MG PO CAPS: 500.0000 mg | ORAL_CAPSULE | Freq: Two times a day (BID) | ORAL | 0 refills | Status: DC

## 2016-06-30 MED ORDER — KETOROLAC TROMETHAMINE 30 MG/ML IJ SOLN
30.0000 mg | Freq: Once | INTRAMUSCULAR | Status: DC | PRN
  Administered 2016-06-30: 30 mg via INTRAVENOUS
  Filled 2016-06-30: qty 1

## 2016-06-30 MED ORDER — ACETAMINOPHEN 160 MG/5ML PO SOLN
325.0000 mg | ORAL | Status: DC | PRN
  Filled 2016-06-30: qty 20.3

## 2016-06-30 MED ORDER — DEXAMETHASONE SODIUM PHOSPHATE 4 MG/ML IJ SOLN
INTRAMUSCULAR | Status: DC | PRN
  Administered 2016-06-30: 10 mg via INTRAVENOUS

## 2016-06-30 MED ORDER — FENTANYL CITRATE (PF) 100 MCG/2ML IJ SOLN
INTRAMUSCULAR | Status: AC
  Filled 2016-06-30: qty 2

## 2016-06-30 MED ORDER — OXYCODONE HCL 5 MG PO TABS
5.0000 mg | ORAL_TABLET | Freq: Once | ORAL | Status: AC | PRN
  Administered 2016-06-30: 5 mg via ORAL
  Filled 2016-06-30: qty 1

## 2016-06-30 MED ORDER — ONDANSETRON HCL 4 MG/2ML IJ SOLN
4.0000 mg | Freq: Once | INTRAMUSCULAR | Status: DC | PRN
Start: 2016-06-30 — End: 2016-06-30
  Filled 2016-06-30: qty 2

## 2016-06-30 MED ORDER — ONDANSETRON HCL 4 MG/2ML IJ SOLN
INTRAMUSCULAR | Status: AC
Start: 2016-06-30 — End: 2016-06-30
  Filled 2016-06-30: qty 2

## 2016-06-30 MED ORDER — MIDAZOLAM HCL 2 MG/2ML IJ SOLN
INTRAMUSCULAR | Status: AC
  Filled 2016-06-30: qty 2

## 2016-06-30 MED ORDER — LIDOCAINE 2% (20 MG/ML) 5 ML SYRINGE
INTRAMUSCULAR | Status: DC | PRN
  Administered 2016-06-30: 80 mg via INTRAVENOUS

## 2016-06-30 MED ORDER — FENTANYL CITRATE (PF) 100 MCG/2ML IJ SOLN
25.0000 ug | INTRAMUSCULAR | Status: DC | PRN
  Administered 2016-06-30 (×2): 50 ug via INTRAVENOUS
  Filled 2016-06-30: qty 1

## 2016-06-30 MED ORDER — KETOROLAC TROMETHAMINE 10 MG PO TABS: 10.0000 mg | ORAL_TABLET | Freq: Three times a day (TID) | ORAL | 1 refills | Status: DC | PRN

## 2016-06-30 MED ORDER — MEPERIDINE HCL 25 MG/ML IJ SOLN
6.2500 mg | INTRAMUSCULAR | Status: DC | PRN
  Filled 2016-06-30: qty 1

## 2016-06-30 MED ORDER — LIDOCAINE 2% (20 MG/ML) 5 ML SYRINGE
INTRAMUSCULAR | Status: AC
  Filled 2016-06-30: qty 5

## 2016-06-30 MED ORDER — OXYCODONE-ACETAMINOPHEN 5-325 MG PO TABS: 1.0000 | ORAL_TABLET | Freq: Four times a day (QID) | ORAL | 0 refills | Status: DC | PRN

## 2016-06-30 MED ORDER — ACETAMINOPHEN 325 MG PO TABS
325.0000 mg | ORAL_TABLET | ORAL | Status: DC | PRN
  Filled 2016-06-30: qty 2

## 2016-06-30 MED ORDER — OXYCODONE HCL 5 MG PO TABS
ORAL_TABLET | ORAL | Status: AC
  Filled 2016-06-30: qty 1

## 2016-06-30 MED ORDER — MIDAZOLAM HCL 5 MG/5ML IJ SOLN
INTRAMUSCULAR | Status: DC | PRN
  Administered 2016-06-30: 2 mg via INTRAVENOUS

## 2016-06-30 MED ORDER — DEXAMETHASONE SODIUM PHOSPHATE 10 MG/ML IJ SOLN
INTRAMUSCULAR | Status: AC
  Filled 2016-06-30: qty 1

## 2016-06-30 MED ORDER — OXYCODONE HCL 5 MG/5ML PO SOLN
5.0000 mg | Freq: Once | ORAL | Status: AC | PRN
  Filled 2016-06-30: qty 5

## 2016-06-30 MED ORDER — CEFTRIAXONE SODIUM 1 G IJ SOLR
INTRAMUSCULAR | Status: AC
  Filled 2016-06-30: qty 10

## 2016-06-30 MED ORDER — PROPOFOL 10 MG/ML IV BOLUS
INTRAVENOUS | Status: AC
  Filled 2016-06-30: qty 20

## 2016-06-30 MED ORDER — ONDANSETRON HCL 4 MG/2ML IJ SOLN
INTRAMUSCULAR | Status: DC | PRN
  Administered 2016-06-30: 4 mg via INTRAVENOUS

## 2016-06-30 MED ORDER — SODIUM CHLORIDE 0.9 % IV SOLN
INTRAVENOUS | Status: DC
  Administered 2016-06-30: 11:00:00 via INTRAVENOUS
  Filled 2016-06-30: qty 1000

## 2016-06-30 MED ORDER — KETOROLAC TROMETHAMINE 30 MG/ML IJ SOLN
INTRAMUSCULAR | Status: AC
Start: 2016-06-30 — End: 2016-06-30
  Filled 2016-06-30: qty 1

## 2016-06-30 SURGICAL SUPPLY — 29 items
BAG DRAIN URO-CYSTO SKYTR STRL (DRAIN) ×2 IMPLANT
BASKET DAKOTA 1.9FR 11X120 (BASKET) IMPLANT
BASKET LASER NITINOL 1.9FR (BASKET) ×2 IMPLANT
BASKET STONE NCOMPASS (UROLOGICAL SUPPLIES) ×2 IMPLANT
BASKET ZERO TIP NITINOL 2.4FR (BASKET) IMPLANT
CATH INTERMIT  6FR 70CM (CATHETERS) ×2 IMPLANT
CLOTH BEACON ORANGE TIMEOUT ST (SAFETY) ×2 IMPLANT
FIBER LASER FLEXIVA 365 (UROLOGICAL SUPPLIES) IMPLANT
FIBER LASER TRAC TIP (UROLOGICAL SUPPLIES) ×2 IMPLANT
GLOVE BIO SURGEON STRL SZ 6.5 (GLOVE) ×2 IMPLANT
GLOVE BIO SURGEON STRL SZ7.5 (GLOVE) ×2 IMPLANT
GLOVE BIOGEL PI IND STRL 6.5 (GLOVE) ×2 IMPLANT
GLOVE BIOGEL PI INDICATOR 6.5 (GLOVE) ×2
GOWN STRL REUS W/ TWL LRG LVL3 (GOWN DISPOSABLE) IMPLANT
GOWN STRL REUS W/TWL LRG LVL3 (GOWN DISPOSABLE) ×4 IMPLANT
GUIDEWIRE ANG ZIPWIRE 038X150 (WIRE) ×2 IMPLANT
GUIDEWIRE STR DUAL SENSOR (WIRE) ×2 IMPLANT
IV NS 1000ML (IV SOLUTION) ×1
IV NS 1000ML BAXH (IV SOLUTION) ×1 IMPLANT
IV NS IRRIG 3000ML ARTHROMATIC (IV SOLUTION) ×2 IMPLANT
KIT RM TURNOVER CYSTO AR (KITS) ×2 IMPLANT
MANIFOLD NEPTUNE II (INSTRUMENTS) ×2 IMPLANT
NS IRRIG 500ML POUR BTL (IV SOLUTION) ×2 IMPLANT
PACK CYSTO (CUSTOM PROCEDURE TRAY) ×2 IMPLANT
SHEATH ACCESS URETERAL 38CM (SHEATH) ×2 IMPLANT
STENT POLARIS LOOP 6FR X 28 CM (STENTS) ×2 IMPLANT
SYRINGE 10CC LL (SYRINGE) ×2 IMPLANT
TUBE CONNECTING 12X1/4 (SUCTIONS) IMPLANT
TUBE FEEDING 8FR 16IN STR KANG (MISCELLANEOUS) ×2 IMPLANT

## 2016-06-30 NOTE — Interval H&P Note (Signed)
History and Physical Interval Note:  06/30/2016 11:06 AM  [name]  has presented today for surgery, with the diagnosis of LEFT KIDNEY STONE  The various methods of treatment have been discussed with the patient and family. After consideration of risks, benefits and other options for treatment, the patient has consented to  Procedure(s): STAGE TWO-CYSTOSCOPY/URETEROSCOPY/HOLMIUM LASER/STENT REPLACEMENT (Left) as a surgical intervention .  The patient's history has been reviewed, patient examined, no change in status, stable for surgery.  I have reviewed the patient's chart and labs.  Questions were answered to the patient's satisfaction.     [name]

## 2016-06-30 NOTE — Discharge Instructions (Signed)
1 - You may have urinary urgency (bladder spasms) and bloody urine on / off with stent in place. This is normal. ° °2 - Call MD or go to ER for fever >102, severe pain / nausea / vomiting not relieved by medications, or acute change in medical status ° °[facility] °[phone] °Post Ureteroscopy With or Without Stent Instructions ° °Definitions: ° °Ureter: The duct that transports urine from the kidney to the bladder. °Stent:   A plastic hollow tube that is placed into the ureter, from the kidney to the  bladder to prevent the ureter from swelling shut. ° °GENERAL INSTRUCTIONS: ° °Despite the fact that no skin incisions were used, the area around the ureter and bladder is raw and irritated. The stent is a foreign body which will further irritate the bladder wall. This irritation is manifested by increased frequency of urination, both day and night, and by an increase in the urge to urinate. In some, the urge to urinate is present almost always. Sometimes the urge is strong enough that you may not be able to stop yourself from urinating. The only real cure is to remove the stent and then give time for the bladder wall to heal which can't be done until the danger of the ureter swelling shut has passed, which varies. ° °You may see some blood in your urine while the stent is in place and a few days afterwards. Do not be alarmed, even if the urine was clear for a while. Get off your feet and drink lots of fluids until clearing occurs. If you start to pass clots or don't improve, call us. ° °DIET: °You may return to your normal diet immediately. Because of the raw surface of your bladder, alcohol, spicy foods, acid type foods and drinks with caffeine may cause irritation or frequency and should be used in moderation. To keep your urine flowing freely and to avoid constipation, drink plenty of fluids during the day ( 8-10 glasses ). °Tip: Avoid cranberry juice because it is very  acidic. ° °ACTIVITY: °Your physical activity doesn't need to be restricted. However, if you are very active, you may see some blood in your urine. We suggest that you reduce your activity under these circumstances until the bleeding has stopped. ° °BOWELS: °It is important to keep your bowels regular during the postoperative period. Straining with bowel movements can cause bleeding. A bowel movement every other day is reasonable. Use a mild laxative if needed, such as Milk of Magnesia 2-3 tablespoons, or 2 Dulcolax tablets. Call if you continue to have problems. If you have been taking narcotics for pain, before, during or after your surgery, you may be constipated. Take a laxative if necessary. ° ° °MEDICATION: °You should resume your pre-surgery medications unless told not to. In addition you will often be given an antibiotic to prevent infection. These should be taken as prescribed until the bottles are finished unless you are having an unusual reaction to one of the drugs. ° °PROBLEMS YOU SHOULD REPORT TO US: °· Fevers over 100.5 Fahrenheit. °· Heavy bleeding, or clots ( See above notes about blood in urine ). °· Inability to urinate. °· Drug reactions ( hives, rash, nausea, vomiting, diarrhea ). °· Severe burning or pain with urination that is not improving. ° °FOLLOW-UP: °You will need a follow-up appointment to monitor your progress. Call for this appointment at the number listed above. Usually the first appointment will be about three to fourteen days after your surgery. ° ° ° ° ° °  Post Anesthesia Home Care Instructions ° °Activity: °Get plenty of rest for the remainder of the day. A responsible individual must stay with you for 24 hours following the procedure.  °For the next 24 hours, DO NOT: °-Drive a car °-Operate machinery °-Drink alcoholic beverages °-Take any medication unless instructed by your physician °-Make any legal decisions or sign important papers. ° °Meals: °Start with liquid foods such as  gelatin or soup. Progress to regular foods as tolerated. Avoid greasy, spicy, heavy foods. If nausea and/or vomiting occur, drink only clear liquids until the nausea and/or vomiting subsides. Call your physician if vomiting continues. ° °Special Instructions/Symptoms: °Your throat may feel dry or sore from the anesthesia or the breathing tube placed in your throat during surgery. If this causes discomfort, gargle with warm salt water. The discomfort should disappear within 24 hours. ° °If you had a scopolamine patch placed behind your ear for the management of post- operative nausea and/or vomiting: ° °1. The medication in the patch is effective for 72 hours, after which it should be removed.  Wrap patch in a tissue and discard in the trash. Wash hands thoroughly with soap and water. °2. You may remove the patch earlier than 72 hours if you experience unpleasant side effects which may include dry mouth, dizziness or visual disturbances. °3. Avoid touching the patch. Wash your hands with soap and water after contact with the patch. °  ° ° °

## 2016-06-30 NOTE — Transfer of Care (Signed)
Immediate Anesthesia Transfer of Care Note  Patient: Tyrone SageClifton E Innocent  Procedure(s) Performed: Procedure(s): STAGE TWO-CYSTOSCOPY/URETEROSCOPY/HOLMIUM LASER/STENT EXCHANGE (Left)  Patient Location: PACU  Anesthesia Type:General  Level of Consciousness: sedated  Airway & Oxygen Therapy: Patient Spontanous Breathing and Patient connected to face mask oxygen  Post-op Assessment: Report given to RN and Post -op Vital signs reviewed and stable  Post vital signs: Reviewed and stable  Last Vitals:  Vitals:   06/30/16 1044 06/30/16 1242  BP: (!) 137/103 130/86  Pulse: 79 75  Resp: 16 16  Temp: 36.4 C 36.4 C    Last Pain:  Vitals:   06/30/16 1056  TempSrc:   PainSc: 2       Patients Stated Pain Goal: 5 (06/30/16 1056)  Complications: No apparent anesthesia complications

## 2016-06-30 NOTE — Brief Op Note (Signed)
06/30/2016  12:33 PM  PATIENT:  [name]  41 y.o. male  PRE-OPERATIVE DIAGNOSIS:  LEFT KIDNEY STONE  POST-OPERATIVE DIAGNOSIS:  LEFT KIDNEY STONE  PROCEDURE:  Procedure(s): STAGE TWO-CYSTOSCOPY/URETEROSCOPY/HOLMIUM LASER/STENT EXCHANGE (Left)  SURGEON:  Surgeon(s) and Role:    * Sebastian Acheheodore Robert Sperl, MD - Primary  PHYSICIAN ASSISTANT:   ASSISTANTS: none   ANESTHESIA:   general  EBL:  Total I/O In: 100 [I.V.:100] Out: -   BLOOD ADMINISTERED:none  DRAINS: none   LOCAL MEDICATIONS USED:  NONE  SPECIMEN:  Source of Specimen:  Left renal stone fragments  DISPOSITION OF SPECIMEN:  [facility] for compositional analysis  COUNTS:  YES  TOURNIQUET:  * No tourniquets in log *  DICTATION: .Other Dictation: Dictation Number [id number]  PLAN OF CARE: Discharge to home after PACU  PATIENT DISPOSITION:  PACU - hemodynamically stable.   Delay start of Pharmacological VTE agent (>24hrs) due to surgical blood loss or risk of bleeding: yes
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
## 2016-06-30 NOTE — Anesthesia Procedure Notes (Signed)
Procedure Name: LMA Insertion Date/Time: 06/30/2016 11:21 AM Performed by: Renella CunasHAZEL, Jakelyn Squyres D Pre-anesthesia Checklist: Patient identified, Emergency Drugs available, Suction available and Patient being monitored Patient Re-evaluated:Patient Re-evaluated prior to inductionOxygen Delivery Method: Circle system utilized Preoxygenation: Pre-oxygenation with 100% oxygen Intubation Type: IV induction Ventilation: Mask ventilation without difficulty LMA: LMA inserted LMA Size: 4.0 Number of attempts: 1 Airway Equipment and Method: Bite block Placement Confirmation: positive ETCO2 Tube secured with: Tape Dental Injury: Teeth and Oropharynx as per pre-operative assessment

## 2016-06-30 NOTE — Anesthesia Postprocedure Evaluation (Addendum)
Anesthesia Post Note  Patient: [name]  Procedure(s) Performed: Procedure(s) (LRB): STAGE TWO-CYSTOSCOPY/URETEROSCOPY/HOLMIUM LASER/STENT EXCHANGE (Left)  Patient location during evaluation: PACU Anesthesia Type: General Level of consciousness: awake Pain management: pain level controlled Vital Signs Assessment: post-procedure vital signs reviewed and stable Respiratory status: spontaneous breathing Cardiovascular status: stable Postop Assessment: no signs of nausea or vomiting Anesthetic complications: no        Last Vitals:  Vitals:   06/30/16 1245 06/30/16 1300  BP: (!) 144/93 (!) 149/101  Pulse: 69 73  Resp: 14 11  Temp:      Last Pain:  Vitals:   06/30/16 1242  TempSrc:   PainSc: Asleep   Pain Goal: Patients Stated Pain Goal: 5 (06/30/16 1056)               Lenda Baratta JR,JOHN Susann GivensFRANKLIN

## 2016-06-30 NOTE — Anesthesia Preprocedure Evaluation (Signed)
Anesthesia Evaluation  Patient identified by MRN, date of birth, ID band Patient awake    Reviewed: Allergy & Precautions, NPO status , Patient's Chart, lab work & pertinent test results  Airway Mallampati: II  TM Distance: >3 FB Neck ROM: Full    Dental no notable dental hx. (+) Teeth Intact   Pulmonary asthma , former smoker,  H/O sarcoidosis.  CXR stable, chronic changes.   Pulmonary exam normal        Cardiovascular hypertension, Normal cardiovascular exam Rhythm:Regular Rate:Normal     Neuro/Psych negative neurological ROS  negative psych ROS   GI/Hepatic negative GI ROS, Neg liver ROS,   Endo/Other  negative endocrine ROS  Renal/GU Renal InsufficiencyRenal diseaseElevated creatinine is improving. Cr 1.44. K 3.6     Musculoskeletal negative musculoskeletal ROS (+)   Abdominal Normal abdominal exam  (+)   Peds  Hematology negative hematology ROS (+)   Anesthesia Other Findings   Reproductive/Obstetrics negative OB ROS                             Anesthesia Physical  Anesthesia Plan  ASA: II  Anesthesia Plan: General   Post-op Pain Management:    Induction: Intravenous  Airway Management Planned: LMA  Additional Equipment:   Intra-op Plan:   Post-operative Plan:   Informed Consent: I have reviewed the patients History and Physical, chart, labs and discussed the procedure including the risks, benefits and alternatives for the proposed anesthesia with the patient or authorized representative who has indicated his/her understanding and acceptance.     Plan Discussed with: CRNA and Surgeon  Anesthesia Plan Comments:         Anesthesia Quick Evaluation

## 2016-07-01 ENCOUNTER — Encounter (HOSPITAL_BASED_OUTPATIENT_CLINIC_OR_DEPARTMENT_OTHER): Payer: Self-pay | Admitting: Urology
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
## 2016-07-01 NOTE — Op Note (Deleted)
  The note originally documented on this encounter has been moved the the encounter in which it belongs.  

## 2016-07-05 DIAGNOSIS — N200 Calculus of kidney: Secondary | ICD-10-CM | POA: Diagnosis not present

## 2016-07-09 ENCOUNTER — Ambulatory Visit (INDEPENDENT_AMBULATORY_CARE_PROVIDER_SITE_OTHER): Payer: BLUE CROSS/BLUE SHIELD | Admitting: Family Medicine

## 2016-07-09 ENCOUNTER — Encounter: Payer: Self-pay | Admitting: Family Medicine

## 2016-07-09 VITALS — BP 110/70 | HR 70 | Wt 189.4 lb

## 2016-07-09 DIAGNOSIS — Z09 Encounter for follow-up examination after completed treatment for conditions other than malignant neoplasm: Secondary | ICD-10-CM
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
## 2016-07-15 DIAGNOSIS — N200 Calculus of kidney: Secondary | ICD-10-CM | POA: Diagnosis not present

## 2016-07-15 DIAGNOSIS — N183 Chronic kidney disease, stage 3 (moderate): Secondary | ICD-10-CM | POA: Diagnosis not present

## 2016-08-03 ENCOUNTER — Ambulatory Visit (INDEPENDENT_AMBULATORY_CARE_PROVIDER_SITE_OTHER): Payer: BLUE CROSS/BLUE SHIELD | Admitting: Orthopaedic Surgery

## 2016-08-03 ENCOUNTER — Encounter (INDEPENDENT_AMBULATORY_CARE_PROVIDER_SITE_OTHER): Payer: Self-pay | Admitting: Orthopaedic Surgery

## 2016-08-03 DIAGNOSIS — M545 Low back pain, unspecified: Secondary | ICD-10-CM

## 2016-08-03 DIAGNOSIS — G8929 Other chronic pain: Secondary | ICD-10-CM | POA: Diagnosis not present

## 2016-08-03 DIAGNOSIS — M542 Cervicalgia: Secondary | ICD-10-CM

## 2016-08-03 MED ORDER — METHOCARBAMOL 500 MG PO TABS: 500.0000 mg | ORAL_TABLET | Freq: Four times a day (QID) | ORAL | 2 refills | Status: DC | PRN
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
## 2016-08-13 ENCOUNTER — Encounter: Payer: BLUE CROSS/BLUE SHIELD | Admitting: Medical

## 2016-08-13 ENCOUNTER — Telehealth: Payer: Self-pay

## 2016-08-13 NOTE — Telephone Encounter (Signed)
[facility]

## 2016-08-13 NOTE — Telephone Encounter (Signed)

## 2016-08-13 NOTE — Telephone Encounter (Signed)
D, call and see why he missed his CPX appt, and send no show fee.   FYI - I thought I was listed as PCP prior.  Did he switch months ago, or was he just lost in the shuffle?

## 2016-08-19 ENCOUNTER — Encounter: Payer: Self-pay | Admitting: Medical

## 2016-08-19 NOTE — Telephone Encounter (Signed)
No show letter with appt request sent.  °

## 2016-09-10 NOTE — Addendum Note (Signed)
Addendum  created 09/10/16 1128 by Tremon Sainvil, MD   Sign clinical note    

## 2016-10-11 ENCOUNTER — Ambulatory Visit (INDEPENDENT_AMBULATORY_CARE_PROVIDER_SITE_OTHER): Payer: BLUE CROSS/BLUE SHIELD | Admitting: Medical

## 2016-10-11 ENCOUNTER — Encounter: Payer: Self-pay | Admitting: Medical

## 2016-10-11 VITALS — BP 130/70 | HR 81 | Wt 173.0 lb

## 2016-10-11 DIAGNOSIS — M79602 Pain in left arm: Secondary | ICD-10-CM

## 2016-10-11 DIAGNOSIS — M545 Low back pain, unspecified: Secondary | ICD-10-CM

## 2016-10-11 DIAGNOSIS — M542 Cervicalgia: Secondary | ICD-10-CM | POA: Diagnosis not present

## 2016-10-11 NOTE — Patient Instructions (Signed)
[facility] Address: [address], [place], [place] [address] Phone: [phone]  
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
## 2016-10-15 ENCOUNTER — Ambulatory Visit (INDEPENDENT_AMBULATORY_CARE_PROVIDER_SITE_OTHER): Payer: BLUE CROSS/BLUE SHIELD | Admitting: Orthopaedic Surgery

## 2016-10-18 ENCOUNTER — Encounter (INDEPENDENT_AMBULATORY_CARE_PROVIDER_SITE_OTHER): Payer: Self-pay | Admitting: Orthopaedic Surgery

## 2016-10-18 ENCOUNTER — Ambulatory Visit (INDEPENDENT_AMBULATORY_CARE_PROVIDER_SITE_OTHER): Payer: BLUE CROSS/BLUE SHIELD | Admitting: Orthopaedic Surgery

## 2016-10-18 DIAGNOSIS — M5413 Radiculopathy, cervicothoracic region: Secondary | ICD-10-CM | POA: Diagnosis not present

## 2016-10-18 DIAGNOSIS — M542 Cervicalgia: Secondary | ICD-10-CM

## 2016-10-18 NOTE — Progress Notes (Signed)
Office Visit Note   Patient: [name]           Date of Birth: [birth year]           MRN: [id number] Visit Date: 10/18/2016              Requested by: Jac Canavanysinger, David S, PA-C [address] [place], [place] [id number] PCP: Jac Canavanysinger, David S, PA-C   Assessment & Plan: Visit Diagnoses:  1. Cervicalgia   2. Radiculopathy of cervicothoracic region     Plan: MRI of his cervical and thoracic spine ordered to rule out structural abnormalities. Follow-up after the MRI.  Follow-Up Instructions: Return in about 2 weeks (around 11/01/2016).   Orders:  Orders Placed This Encounter  Procedures  . MR Cervical Spine w/o contrast  . MR Thoracic Spine w/o contrast   No orders of the defined types were placed in this encounter.     Procedures: No procedures performed   Clinical Data: No additional findings.   Subjective: Chief Complaint  Patient presents with  . Neck - Follow-up, Pain    Patient comes back today for continued mid and left arm pain. He has completed physical therapy. He is not really taking any medicines now. He states that most the pain is in his mid back with numbness and tingling in his entire hand and arm.    Review of Systems  Constitutional: Negative.   All other systems reviewed and are negative.    Objective: Vital Signs: There were no vitals taken for this visit.  Physical Exam  Constitutional: He is oriented to person, place, and time. He appears well-developed and well-nourished.  Pulmonary/Chest: Effort normal.  Abdominal: Soft.  Neurological: He is alert and oriented to person, place, and time.  Skin: Skin is warm.  Psychiatric: He has a normal mood and affect. His behavior is normal. Judgment and thought content normal.  Nursing note and vitals reviewed.   Ortho Exam Exam is essentially unremarkable. There is no carpal tunnel compressive signs. He has no focal motor or sensory deficits. Specialty Comments:  No specialty  comments available.  Imaging: No results found.   PMFS History: Patient Active Problem List   Diagnosis Date Noted  . Elevated blood pressure reading 05/13/2016  . Staghorn kidney stones 01/30/2015  . Nephrolithiasis   . Right ureteral stone 01/20/2015  . Hydronephrosis of right kidney 01/20/2015  . Hydronephrosis, right 01/20/2015  . Sarcoid 10/19/2010   Past Medical History:  Diagnosis Date  . Asthma    last asthma attack last year in 2012  . Hearing impairment    wears hearing aids  . History of kidney stones   . Hypertension    off bp meds  last 2 years   . Renal stone   . Sarcoidosis   . Wears glasses     Family History  Problem Relation Age of Onset  . Hypertension Mother   . Asthma Mother   . Diabetes Father   . Heart attack Father   . Liver disease Sister   . Cancer Paternal Grandmother     Past Surgical History:  Procedure Laterality Date  . CYSTOSCOPY W/ RETROGRADES Right 08/20/2015   Procedure: CYSTOSCOPY WITH RETROGRADE PYELOGRAM;  Surgeon: Sebastian Acheheodore Manny, MD;  [facility];  Service: Urology;  Laterality: Right;  . CYSTOSCOPY WITH URETEROSCOPY AND STENT PLACEMENT Right 08/20/2015   Procedure: CYSTOSCOPY WITH URETEROSCOPY AND STENT PLACEMENT;  Surgeon: Sebastian Acheheodore Manny, MD;  [facility] [place];  Service: Urology;  Laterality: Right;  . CYSTOSCOPY/RETROGRADE/URETEROSCOPY  01/12/2012   Procedure: CYSTOSCOPY/RETROGRADE/URETEROSCOPY;  Surgeon: Sebastian Ache, MD;  [facility];  Service: Urology;  Laterality: Right;  . CYSTOSCOPY/URETEROSCOPY/HOLMIUM LASER/STENT PLACEMENT Left 06/16/2016   Procedure: STAGE ONE-CYSTOSCOPY/DIAGNOSTIC URETEROSCOPY/RETROGRADE/STENT PLACEMENT;  Surgeon: Sebastian Ache, MD;  [facility];  Service: Urology;  Laterality: Left;  . CYSTOSCOPY/URETEROSCOPY/HOLMIUM LASER/STENT PLACEMENT Left 06/30/2016   Procedure: STAGE TWO-CYSTOSCOPY/URETEROSCOPY/HOLMIUM LASER/STENT EXCHANGE;  Surgeon:  Sebastian Ache, MD;  Location: [facility];  Service: Urology;  Laterality: Left;  . ESOPHAGEAL DILATION    . HOLMIUM LASER APPLICATION Right 08/20/2015   Procedure: HOLMIUM LASER APPLICATION;  Surgeon: Sebastian Ache, MD;  Location: [facility];  Service: Urology;  Laterality: Right;  . NEPHROLITHOTOMY  02/11/2012   Procedure: NEPHROLITHOTOMY PERCUTANEOUS;  Surgeon: Sebastian Ache, MD;  [facility];  Service: Urology;  Laterality: Right;  . NEPHROLITHOTOMY Right 01/30/2015   Procedure: NEPHROLITHOTOMY PERCUTANEOUS, NEPHROSTOGRAM;  Surgeon: Sebastian Ache, MD;  [facility];  Service: Urology;  Laterality: Right;  . STONE EXTRACTION WITH BASKET Right 08/20/2015   Procedure: STONE EXTRACTION WITH BASKET;  Surgeon: Sebastian Ache, MD;  Location: [facility];  Service: Urology;  Laterality: Right;  . URETEROSCOPY  01/30/2015   Procedure: URETEROSCOPY WITH BASKETING;  Surgeon: Sebastian Ache, MD;  [facility];  Service: Urology;;   Social History   Occupational History  . Construction    Social History Main Topics  . Smoking status: Former Smoker    Packs/day: 0.20    Years: 2.00    Types: Cigarettes    Quit date: 04/06/2007  . Smokeless tobacco: Former Neurosurgeon    Types: Snuff    Quit date: 04/05/2013  . Alcohol use No  . Drug use: No  . Sexual activity: Yes    Birth control/ protection: None

## 2016-11-04 ENCOUNTER — Ambulatory Visit
Admission: RE | Admit: 2016-11-04 | Discharge: 2016-11-04 | Disposition: A | Discharge status: Home / Self Care | Payer: BLUE CROSS/BLUE SHIELD | Source: Ambulatory Visit | Attending: Orthopaedic Surgery | Admitting: Orthopaedic Surgery

## 2016-11-04 ENCOUNTER — Ambulatory Visit (INDEPENDENT_AMBULATORY_CARE_PROVIDER_SITE_OTHER): Payer: BLUE CROSS/BLUE SHIELD | Admitting: Orthopaedic Surgery

## 2016-11-04 DIAGNOSIS — M5021 Other cervical disc displacement,  high cervical region: Secondary | ICD-10-CM | POA: Diagnosis not present

## 2016-11-04 DIAGNOSIS — M542 Cervicalgia: Secondary | ICD-10-CM

## 2016-11-04 DIAGNOSIS — M5413 Radiculopathy, cervicothoracic region: Secondary | ICD-10-CM

## 2016-11-04 DIAGNOSIS — M5134 Other intervertebral disc degeneration, thoracic region: Secondary | ICD-10-CM | POA: Diagnosis not present

## 2016-11-04 DIAGNOSIS — M50222 Other cervical disc displacement at C5-C6 level: Secondary | ICD-10-CM | POA: Diagnosis not present

## 2016-11-04 DIAGNOSIS — M50221 Other cervical disc displacement at C4-C5 level: Secondary | ICD-10-CM | POA: Diagnosis not present

## 2016-11-04 DIAGNOSIS — M50223 Other cervical disc displacement at C6-C7 level: Secondary | ICD-10-CM | POA: Diagnosis not present

## 2016-11-08 ENCOUNTER — Ambulatory Visit (INDEPENDENT_AMBULATORY_CARE_PROVIDER_SITE_OTHER): Payer: BLUE CROSS/BLUE SHIELD | Admitting: Orthopaedic Surgery

## 2016-11-08 ENCOUNTER — Encounter (INDEPENDENT_AMBULATORY_CARE_PROVIDER_SITE_OTHER): Payer: Self-pay | Admitting: Orthopaedic Surgery

## 2016-11-08 DIAGNOSIS — M5413 Radiculopathy, cervicothoracic region: Secondary | ICD-10-CM

## 2016-11-08 NOTE — Addendum Note (Signed)
Addended by: Albertina ParrGARCIA, Ronald Londo on: 11/08/2016 08:38 AM   Modules accepted: Orders

## 2016-11-08 NOTE — Progress Notes (Signed)
Office Visit Note   Patient: [name]           Date of Birth: 1975/12/29           MRN: [id number] Visit Date: 11/08/2016              Requested by: Jac Canavanysinger, David S, PA-C [address] [place], [place] [id number] PCP: Jac Canavanysinger, David S, PA-C   Assessment & Plan: Visit Diagnoses:  1. Radiculopathy of cervicothoracic region     Plan: Cervical spine MRI shows mainly stenosis at C6-7. Recommend referral to Dr. Alvester MorinNewton for epidural steroid injection. Questions encouraged and answered. Follow-up as needed.  Follow-Up Instructions: Return if symptoms worsen or fail to improve.   Orders:  No orders of the defined types were placed in this encounter.  No orders of the defined types were placed in this encounter.     Procedures: No procedures performed   Clinical Data: No additional findings.   Subjective: Chief Complaint  Patient presents with  . Neck - Pain, Follow-up    Patient comes back today to review his MRI. He has had new symptoms on his right side.    Review of Systems   Objective: Vital Signs: There were no vitals taken for this visit.  Physical Exam  Ortho Exam Exam is otherwise stable. No focal findings Specialty Comments:  No specialty comments available.  Imaging: No results found.   PMFS History: Patient Active Problem List   Diagnosis Date Noted  . Elevated blood pressure reading 05/13/2016  . Staghorn kidney stones 01/30/2015  . Nephrolithiasis   . Right ureteral stone 01/20/2015  . Hydronephrosis of right kidney 01/20/2015  . Hydronephrosis, right 01/20/2015  . Sarcoid 10/19/2010   Past Medical History:  Diagnosis Date  . Asthma    last asthma attack last year in 2012  . Hearing impairment    wears hearing aids  . History of kidney stones   . Hypertension    off bp meds  last 2 years   . Renal stone   . Sarcoidosis   . Wears glasses     Family History  Problem Relation Age of Onset  . Hypertension Mother   .  Asthma Mother   . Diabetes Father   . Heart attack Father   . Liver disease Sister   . Cancer Paternal Grandmother     Past Surgical History:  Procedure Laterality Date  . CYSTOSCOPY W/ RETROGRADES Right 08/20/2015   Procedure: CYSTOSCOPY WITH RETROGRADE PYELOGRAM;  Surgeon: Sebastian Acheheodore Manny, MD;  [facility] [place];  Service: Urology;  Laterality: Right;  . CYSTOSCOPY WITH URETEROSCOPY AND STENT PLACEMENT Right 08/20/2015   Procedure: CYSTOSCOPY WITH URETEROSCOPY AND STENT PLACEMENT;  Surgeon: Sebastian Acheheodore Manny, MD;  [facility];  Service: Urology;  Laterality: Right;  . CYSTOSCOPY/RETROGRADE/URETEROSCOPY  01/12/2012   Procedure: CYSTOSCOPY/RETROGRADE/URETEROSCOPY;  Surgeon: Sebastian Acheheodore Manny, MD;  [facility];  Service: Urology;  Laterality: Right;  . CYSTOSCOPY/URETEROSCOPY/HOLMIUM LASER/STENT PLACEMENT Left 06/16/2016   Procedure: STAGE ONE-CYSTOSCOPY/DIAGNOSTIC URETEROSCOPY/RETROGRADE/STENT PLACEMENT;  Surgeon: Sebastian Acheheodore Manny, MD;  [facility];  Service: Urology;  Laterality: Left;  . CYSTOSCOPY/URETEROSCOPY/HOLMIUM LASER/STENT PLACEMENT Left 06/30/2016   Procedure: STAGE TWO-CYSTOSCOPY/URETEROSCOPY/HOLMIUM LASER/STENT EXCHANGE;  Surgeon: Sebastian Acheheodore Manny, MD;  [facility] [place];  Service: Urology;  Laterality: Left;  . ESOPHAGEAL DILATION    . HOLMIUM LASER APPLICATION Right 08/20/2015   Procedure: HOLMIUM LASER APPLICATION;  Surgeon: Sebastian Acheheodore Manny, MD;  [facility] [place];  Service: Urology;  Laterality: Right;  . NEPHROLITHOTOMY  02/11/2012   Procedure: NEPHROLITHOTOMY PERCUTANEOUS;  Surgeon: Sebastian Ache, MD;  [facility];  Service: Urology;  Laterality: Right;  . NEPHROLITHOTOMY Right 01/30/2015   Procedure: NEPHROLITHOTOMY PERCUTANEOUS, NEPHROSTOGRAM;  Surgeon: Sebastian Ache, MD;  [facility];  Service: Urology;  Laterality: Right;  . STONE EXTRACTION WITH BASKET Right 08/20/2015   Procedure:  STONE EXTRACTION WITH BASKET;  Surgeon: Sebastian Ache, MD;  [facility];  Service: Urology;  Laterality: Right;  . URETEROSCOPY  01/30/2015   Procedure: URETEROSCOPY WITH BASKETING;  Surgeon: Sebastian Ache, MD;  [facility];  Service: Urology;;   Social History   Occupational History  . Construction    Social History Main Topics  . Smoking status: Former Smoker    Packs/day: 0.20    Years: 2.00    Types: Cigarettes    Quit date: 04/06/2007  . Smokeless tobacco: Former Neurosurgeon    Types: Snuff    Quit date: 04/05/2013  . Alcohol use No  . Drug use: No  . Sexual activity: Yes    Birth control/ protection: None

## 2016-11-15 ENCOUNTER — Telehealth (INDEPENDENT_AMBULATORY_CARE_PROVIDER_SITE_OTHER): Payer: Self-pay | Admitting: Radiology

## 2016-11-15 ENCOUNTER — Other Ambulatory Visit (INDEPENDENT_AMBULATORY_CARE_PROVIDER_SITE_OTHER): Payer: Self-pay | Admitting: Family

## 2016-11-15 MED ORDER — TRAMADOL HCL 50 MG PO TABS: 50.0000 mg | ORAL_TABLET | Freq: Four times a day (QID) | ORAL | 0 refills | Status: DC | PRN

## 2016-11-15 NOTE — Telephone Encounter (Signed)
Patient is sched for consult with Dr. Alvester MorinNewton on 11/18/16 and he is needing something for pain. Please call patient to advise.

## 2016-11-15 NOTE — Telephone Encounter (Signed)
Can you advise since Dr Roda ShuttersXu is out of the office please and thank you.

## 2016-11-16 NOTE — Telephone Encounter (Signed)
Called into pharm  

## 2016-11-18 ENCOUNTER — Encounter (INDEPENDENT_AMBULATORY_CARE_PROVIDER_SITE_OTHER): Payer: Self-pay | Admitting: Physical Medicine and Rehabilitation

## 2016-11-18 ENCOUNTER — Ambulatory Visit (INDEPENDENT_AMBULATORY_CARE_PROVIDER_SITE_OTHER): Payer: BLUE CROSS/BLUE SHIELD | Admitting: Physical Medicine and Rehabilitation

## 2016-11-18 VITALS — BP 134/94 | HR 72

## 2016-11-18 DIAGNOSIS — M5010 Cervical disc disorder with radiculopathy, unspecified cervical region: Secondary | ICD-10-CM | POA: Diagnosis not present

## 2016-11-18 DIAGNOSIS — M5412 Radiculopathy, cervical region: Secondary | ICD-10-CM

## 2016-11-18 DIAGNOSIS — G8929 Other chronic pain: Secondary | ICD-10-CM

## 2016-11-18 DIAGNOSIS — M546 Pain in thoracic spine: Secondary | ICD-10-CM

## 2016-11-18 MED ORDER — DIAZEPAM 5 MG PO TABS: ORAL_TABLET | ORAL | 0 refills | Status: DC
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
## 2016-11-23 ENCOUNTER — Ambulatory Visit (INDEPENDENT_AMBULATORY_CARE_PROVIDER_SITE_OTHER): Payer: Self-pay

## 2016-11-23 ENCOUNTER — Ambulatory Visit (INDEPENDENT_AMBULATORY_CARE_PROVIDER_SITE_OTHER): Payer: BLUE CROSS/BLUE SHIELD | Admitting: Physical Medicine and Rehabilitation

## 2016-11-23 ENCOUNTER — Encounter (INDEPENDENT_AMBULATORY_CARE_PROVIDER_SITE_OTHER): Payer: Self-pay | Admitting: Physical Medicine and Rehabilitation

## 2016-11-23 VITALS — BP 138/99 | HR 75

## 2016-11-23 DIAGNOSIS — M546 Pain in thoracic spine: Secondary | ICD-10-CM

## 2016-11-23 DIAGNOSIS — M5412 Radiculopathy, cervical region: Secondary | ICD-10-CM

## 2016-11-23 DIAGNOSIS — M5010 Cervical disc disorder with radiculopathy, unspecified cervical region: Secondary | ICD-10-CM

## 2016-11-23 DIAGNOSIS — G8929 Other chronic pain: Secondary | ICD-10-CM

## 2016-11-23 MED ORDER — LIDOCAINE HCL (PF) 1 % IJ SOLN
2.0000 mL | Freq: Once | INTRAMUSCULAR | Status: AC
  Administered 2016-11-23: 2 mL

## 2016-11-23 MED ORDER — BETAMETHASONE SOD PHOS & ACET 6 (3-3) MG/ML IJ SUSP
12.0000 mg | Freq: Once | INTRAMUSCULAR | Status: AC
  Administered 2016-11-23: 12 mg

## 2016-11-23 NOTE — Progress Notes (Deleted)
Patient is here for planned injection. No change in symptoms.  

## 2016-11-23 NOTE — Patient Instructions (Signed)

## 2016-11-24 ENCOUNTER — Encounter (INDEPENDENT_AMBULATORY_CARE_PROVIDER_SITE_OTHER): Payer: Self-pay | Admitting: Physical Medicine and Rehabilitation

## 2016-11-24 NOTE — Procedures (Signed)
[name] is a 41 year old gentleman here for planned C7-T1 intralaminar epidural steroid injection. He has symptoms really bilaterally.  We will use a left-sided paracentral approach. Please see our prior evaluation and management note for further details and justification.  Cervical Epidural Steroid Injection - Interlaminar Approach with Fluoroscopic Guidance  Patient: [name]      Date of Birth: 05/10/1975 MRN: [id number] PCP: Jac Canavan, PA-C      Visit Date: 11/23/2016   Universal Protocol:    Date/Time: 08/22/185:52 AM  Consent Given By: the patient  Position: PRONE  Additional Comments: Vital signs were monitored before and after the procedure. Patient was prepped and draped in the usual sterile fashion. The correct patient, procedure, and site was verified.   Injection Procedure Details:  Procedure Site One Meds Administered:  Meds ordered this encounter  Medications  . lidocaine (PF) (XYLOCAINE) 1 % injection 2 mL  . betamethasone acetate-betamethasone sodium phosphate (CELESTONE) injection 12 mg     Laterality: Left  Location/Site:  C7-T1  Needle size: 20 G  Needle type: Touhy  Needle Placement: Paramedian epidural space  Findings:  -Contrast Used: 1 mL iohexol 180 mg iodine/mL   -Comments: Excellent flow of contrast into the epidural space.  Procedure Details: Using a paramedian approach from the side mentioned above, the region overlying the inferior lamina was localized under fluoroscopic visualization and the soft tissues overlying this structure were infiltrated with 4 ml. of 1% Lidocaine without Epinephrine. A # 20 gauge, Tuohy needle was inserted into the epidural space using a paramedian approach.  The epidural space was localized using loss of resistance along with lateral and contralateral oblique bi-planar fluoroscopic views.  After negative aspirate for air, blood, and CSF, a 2 ml. volume of Isovue-250 was injected into the epidural  space and the flow of contrast was observed. Radiographs were obtained for documentation purposes.   The injectate was administered into the level noted above.  Additional Comments:  The patient tolerated the procedure well Dressing: Band-Aid    Post-procedure details: Patient was observed during the procedure. Post-procedure instructions were reviewed.  Patient left the clinic in stable condition.

## 2016-12-14 ENCOUNTER — Encounter (INDEPENDENT_AMBULATORY_CARE_PROVIDER_SITE_OTHER): Payer: Self-pay | Admitting: Physical Medicine and Rehabilitation

## 2016-12-14 ENCOUNTER — Ambulatory Visit (INDEPENDENT_AMBULATORY_CARE_PROVIDER_SITE_OTHER): Payer: BLUE CROSS/BLUE SHIELD | Admitting: Physical Medicine and Rehabilitation

## 2016-12-14 VITALS — BP 141/104 | HR 61

## 2016-12-14 DIAGNOSIS — G8929 Other chronic pain: Secondary | ICD-10-CM

## 2016-12-14 DIAGNOSIS — M5010 Cervical disc disorder with radiculopathy, unspecified cervical region: Secondary | ICD-10-CM

## 2016-12-14 DIAGNOSIS — M546 Pain in thoracic spine: Secondary | ICD-10-CM | POA: Diagnosis not present

## 2016-12-14 DIAGNOSIS — M5412 Radiculopathy, cervical region: Secondary | ICD-10-CM | POA: Diagnosis not present

## 2016-12-14 NOTE — Progress Notes (Deleted)
Injection helped for about 3-4 days. No pain by second day. The neck and arm pain have returned. Still having lower back pain.

## 2016-12-15 ENCOUNTER — Encounter (INDEPENDENT_AMBULATORY_CARE_PROVIDER_SITE_OTHER): Payer: Self-pay | Admitting: Physical Medicine and Rehabilitation
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
## 2016-12-17 DIAGNOSIS — M542 Cervicalgia: Secondary | ICD-10-CM | POA: Diagnosis not present

## 2016-12-24 DIAGNOSIS — M542 Cervicalgia: Secondary | ICD-10-CM | POA: Diagnosis not present

## 2016-12-29 DIAGNOSIS — M542 Cervicalgia: Secondary | ICD-10-CM | POA: Diagnosis not present

## 2017-01-14 ENCOUNTER — Telehealth (INDEPENDENT_AMBULATORY_CARE_PROVIDER_SITE_OTHER): Payer: Self-pay | Admitting: Physical Medicine and Rehabilitation

## 2017-01-14 NOTE — Telephone Encounter (Signed)
Has he tried PT with trigger point or dry needling?  Did cesi help?

## 2017-01-17 NOTE — Telephone Encounter (Signed)
Left message for patient to call back to discuss.

## 2017-01-18 NOTE — Telephone Encounter (Signed)
Patient called back. I returned his call and left another message.

## 2017-01-19 NOTE — Telephone Encounter (Signed)
Ok to repeat but will need to talk about headaches, not directly related to spine

## 2017-01-19 NOTE — Telephone Encounter (Signed)
Patient did try PT with dry needling without much relief. He reports 75% relief from last ESI. Please advise. [phone].

## 2017-01-19 NOTE — Telephone Encounter (Signed)
Scheduled for 10-23

## 2017-01-25 ENCOUNTER — Ambulatory Visit (INDEPENDENT_AMBULATORY_CARE_PROVIDER_SITE_OTHER): Payer: BLUE CROSS/BLUE SHIELD

## 2017-01-25 ENCOUNTER — Ambulatory Visit (INDEPENDENT_AMBULATORY_CARE_PROVIDER_SITE_OTHER): Payer: BLUE CROSS/BLUE SHIELD | Admitting: Physical Medicine and Rehabilitation

## 2017-01-25 VITALS — BP 150/100 | HR 94 | Temp 98.4°F

## 2017-01-25 DIAGNOSIS — G4486 Cervicogenic headache: Secondary | ICD-10-CM

## 2017-01-25 DIAGNOSIS — M5412 Radiculopathy, cervical region: Secondary | ICD-10-CM

## 2017-01-25 DIAGNOSIS — M5010 Cervical disc disorder with radiculopathy, unspecified cervical region: Secondary | ICD-10-CM | POA: Diagnosis not present

## 2017-01-25 DIAGNOSIS — R51 Headache: Secondary | ICD-10-CM

## 2017-01-25 MED ORDER — LIDOCAINE HCL (PF) 1 % IJ SOLN
2.0000 mL | Freq: Once | INTRAMUSCULAR | Status: AC
  Administered 2017-01-25: 2 mL

## 2017-01-25 MED ORDER — BETAMETHASONE SOD PHOS & ACET 6 (3-3) MG/ML IJ SUSP
12.0000 mg | Freq: Once | INTRAMUSCULAR | Status: AC
  Administered 2017-01-25: 12 mg

## 2017-01-25 NOTE — Progress Notes (Deleted)
Patient here today to discuss neck pain (with stiffness) with quite a bit of headaches. He states he does have some numbness and tingling in his first 3 fingers on his left hand.

## 2017-01-25 NOTE — Patient Instructions (Signed)

## 2017-02-04 NOTE — Procedures (Signed)
[name] is a 41 year old right-hand dominant gentleman comes in today after having had C7-T1 interlaminar epidural steroid injection.  He did get quite a bit of relief with his symptoms but his symptoms are still persistent starting to return.  He has some numbness and tingling digits of his left hand.  He has some neck pain and stiffness and he is getting headaches.  The injection  will be diagnostic and hopefully therapeutic. The patient has failed conservative care including time, medications and activity modification.  Cervical Epidural Steroid Injection - Interlaminar Approach with Fluoroscopic Guidance  Patient: [name]      Date of Birth: [birth year] MRN: [id number] PCP: Jac Canavanysinger, David S, PA-C      Visit Date: 01/25/2017   Universal Protocol:    Date/Time: 11/02/185:20 AM  Consent Given By: the patient  Position: PRONE  Additional Comments: Vital signs were monitored before and after the procedure. Patient was prepped and draped in the usual sterile fashion. The correct patient, procedure, and site was verified.   Injection Procedure Details:  Procedure Site One Meds Administered:  Meds ordered this encounter  Medications  . lidocaine (PF) (XYLOCAINE) 1 % injection 2 mL  . betamethasone acetate-betamethasone sodium phosphate (CELESTONE) injection 12 mg     Laterality: Left  Location/Site:  C7-T1  Needle size: 20 G  Needle type: Touhy  Needle Placement: Paramedian epidural space  Findings:  -Contrast Used: 1 mL iohexol 180 mg iodine/mL   -Comments: Excellent flow of contrast into the epidural space.  Procedure Details: Using a paramedian approach from the side mentioned above, the region overlying the inferior lamina was localized under fluoroscopic visualization and the soft tissues overlying this structure were infiltrated with 4 ml. of 1% Lidocaine without Epinephrine. A # 20 gauge, Tuohy needle was inserted into the epidural space using a paramedian  approach.  The epidural space was localized using loss of resistance along with lateral and contralateral oblique bi-planar fluoroscopic views.  After negative aspirate for air, blood, and CSF, a 2 ml. volume of Isovue-250 was injected into the epidural space and the flow of contrast was observed. Radiographs were obtained for documentation purposes.   The injectate was administered into the level noted above.  Additional Comments:  The patient tolerated the procedure well Dressing: Band-Aid    Post-procedure details: Patient was observed during the procedure. Post-procedure instructions were reviewed.  Patient left the clinic in stable condition.

## 2017-03-01 ENCOUNTER — Telehealth (INDEPENDENT_AMBULATORY_CARE_PROVIDER_SITE_OTHER): Payer: Self-pay | Admitting: Physical Medicine and Rehabilitation

## 2017-03-01 NOTE — Telephone Encounter (Signed)
We need to get Dr. Roda ShuttersXu or we can send to nsgy/spine surgeon, if he wants I would send him to Dr. Wynetta Emeryram or Yetta BarreJones

## 2017-03-02 NOTE — Telephone Encounter (Signed)
Left message for patient to call back to discuss.

## 2017-03-03 ENCOUNTER — Other Ambulatory Visit (INDEPENDENT_AMBULATORY_CARE_PROVIDER_SITE_OTHER): Payer: Self-pay | Admitting: Physical Medicine and Rehabilitation

## 2017-03-03 DIAGNOSIS — M546 Pain in thoracic spine: Secondary | ICD-10-CM

## 2017-03-03 DIAGNOSIS — M5412 Radiculopathy, cervical region: Secondary | ICD-10-CM

## 2017-03-03 DIAGNOSIS — M5010 Cervical disc disorder with radiculopathy, unspecified cervical region: Secondary | ICD-10-CM

## 2017-03-03 DIAGNOSIS — G8929 Other chronic pain: Secondary | ICD-10-CM

## 2017-03-03 NOTE — Telephone Encounter (Signed)
Patient does wish to proceed with referral to [facility].

## 2017-03-03 NOTE — Progress Notes (Signed)
Patient called in with continued neck pain, some relief but temporary relief with epidural x2.  Referral placed for Dr. Wynetta Emeryram for evaluation.

## 2017-03-03 NOTE — Telephone Encounter (Signed)
Referral placed for Dr. Wynetta Emeryram

## 2017-03-22 ENCOUNTER — Encounter: Payer: Self-pay | Admitting: Medical

## 2017-03-22 ENCOUNTER — Ambulatory Visit: Payer: BLUE CROSS/BLUE SHIELD | Admitting: Medical

## 2017-03-22 VITALS — BP 128/80 | HR 61 | Wt 184.0 lb

## 2017-03-22 DIAGNOSIS — R030 Elevated blood-pressure reading, without diagnosis of hypertension: Secondary | ICD-10-CM

## 2017-03-22 DIAGNOSIS — J4540 Moderate persistent asthma, uncomplicated: Secondary | ICD-10-CM | POA: Insufficient documentation

## 2017-03-22 DIAGNOSIS — N200 Calculus of kidney: Secondary | ICD-10-CM

## 2017-03-22 DIAGNOSIS — R519 Headache, unspecified: Secondary | ICD-10-CM

## 2017-03-22 DIAGNOSIS — R51 Headache: Secondary | ICD-10-CM

## 2017-03-22 DIAGNOSIS — R0981 Nasal congestion: Secondary | ICD-10-CM | POA: Diagnosis not present

## 2017-03-22 DIAGNOSIS — Z862 Personal history of diseases of the blood and blood-forming organs and certain disorders involving the immune mechanism: Secondary | ICD-10-CM | POA: Insufficient documentation

## 2017-03-22 LAB — CBC WITH DIFFERENTIAL/PLATELET
BASOS ABS: 31 {cells}/uL (ref 0–200)
BASOS PCT: 0.6 %
EOS PCT: 1.9 %
Eosinophils Absolute: 99 cells/uL (ref 15–500)
HEMATOCRIT: 48 % (ref 38.5–50.0)
HEMOGLOBIN: 17 g/dL (ref 13.2–17.1)
LYMPHS ABS: 1414 {cells}/uL (ref 850–3900)
MCH: 29.7 pg (ref 27.0–33.0)
MCHC: 35.4 g/dL (ref 32.0–36.0)
MCV: 83.9 fL (ref 80.0–100.0)
MONOS PCT: 9.9 %
MPV: 8.8 fL (ref 7.5–12.5)
NEUTROS ABS: 3141 {cells}/uL (ref 1500–7800)
Neutrophils Relative %: 60.4 %
Platelets: 312 10*3/uL (ref 140–400)
RBC: 5.72 10*6/uL (ref 4.20–5.80)
RDW: 13.7 % (ref 11.0–15.0)
Total Lymphocyte: 27.2 %
WBC mixed population: 515 cells/uL (ref 200–950)
WBC: 5.2 10*3/uL (ref 3.8–10.8)

## 2017-03-22 LAB — COMPREHENSIVE METABOLIC PANEL
AG Ratio: 1.4 (calc) (ref 1.0–2.5)
ALT: 25 U/L (ref 9–46)
AST: 26 U/L (ref 10–40)
Albumin: 4.8 g/dL (ref 3.6–5.1)
Alkaline phosphatase (APISO): 67 U/L (ref 40–115)
BILIRUBIN TOTAL: 0.8 mg/dL (ref 0.2–1.2)
BUN / CREAT RATIO: 15 (calc) (ref 6–22)
BUN: 26 mg/dL — AB (ref 7–25)
CALCIUM: 10.3 mg/dL (ref 8.6–10.3)
CO2: 24 mmol/L (ref 20–32)
Chloride: 99 mmol/L (ref 98–110)
Creat: 1.71 mg/dL — ABNORMAL HIGH (ref 0.60–1.35)
GLUCOSE: 109 mg/dL — AB (ref 65–99)
Globulin: 3.5 g/dL (calc) (ref 1.9–3.7)
Potassium: 3.9 mmol/L (ref 3.5–5.3)
SODIUM: 136 mmol/L (ref 135–146)
TOTAL PROTEIN: 8.3 g/dL — AB (ref 6.1–8.1)

## 2017-03-22 LAB — TSH: TSH: 4.14 m[IU]/L (ref 0.40–4.50)

## 2017-03-22 LAB — SEDIMENTATION RATE: SED RATE: 9 mm/h (ref 0–15)

## 2017-03-22 MED ORDER — AMOXICILLIN 875 MG PO TABS: 875.0000 mg | ORAL_TABLET | Freq: Two times a day (BID) | ORAL | 0 refills | Status: DC

## 2017-03-22 MED ORDER — BUDESONIDE-FORMOTEROL FUMARATE 160-4.5 MCG/ACT IN AERO: 2.0000 | INHALATION_SPRAY | Freq: Two times a day (BID) | RESPIRATORY_TRACT | 2 refills | Status: DC

## 2017-03-22 MED ORDER — ALBUTEROL SULFATE HFA 108 (90 BASE) MCG/ACT IN AERS
2.0000 | INHALATION_SPRAY | Freq: Four times a day (QID) | RESPIRATORY_TRACT | 0 refills | Status: DC | PRN
Start: 2017-03-22 — End: 2017-04-07

## 2017-03-22 NOTE — Progress Notes (Signed)
Subjective: Chief Complaint  Patient presents with  . headaches and b/p    headaches and b/p running comes  goes, notice blood in mucus when he blew his nose on yesterday     [name] is a 41 y.o. male who presents for several concerns.  He is here for about a month history of headaches and elevated blood pressures.  Getting headaches somewhat frequently several times per week, mild to moderate headaches.  No numbness or tingling, no vision change or slurred speech.  Although he has not had a lot of head congestion he did see some blood-tinged mucus when he blew his nose yesterday.  He has his home digital cuff that tracks blood pressure numbers.  Highest 150/103, avg 130-140s SBP, DBP always in the 100s.  He denies chest pain, no swelling in legs, palpitations.  He does report shortness of breath all the time, but says he has a history of asthma and sarcoidosis.  He has not seen his lung doctor since 2014.  Had inhaler but date ran out.  Has hx/o sarcoidosis.  Has been on preventative inhalers in the past, maybe 2 years ago.   Last use of rescue inhaler over 2 years ago. hasn't seen lung doctor in a while.     He notes no diet discretion, eats what he wants including fast food and salty foods regularly.  He is active with some exercise.  He denies history of echocardiogram of the heart.  He has recurrent kidney stones, sees urology for this, reportedly related to sarcoid  No other aggravating or relieving factors.    No other c/o.  The following portions of the patient's history were reviewed and updated as appropriate: allergies, current medications, past family history, past medical history, past social history, past surgical history and problem list.  ROS Otherwise as in subjective above  Objective:Exam BP 128/80   Pulse 61   Wt 184 lb (83.5 kg)   SpO2 96%   BMI 25.66 kg/m   Wt Readings from Last 3 Encounters:  03/22/17 184 lb (83.5 kg)  10/11/16 173 lb (78.5 kg)   07/09/16 189 lb 6.4 oz (85.9 kg)   BP Readings from Last 3 Encounters:  03/22/17 128/80  01/25/17 (!) 150/100  12/14/16 (!) 141/104    General appearance: alert, no distress, WD/WN, lean AA male HEENT: normocephalic, sclerae anicteric, conjunctiva pink and moist, TMs pearly, nares patent, mild erythema, slight mucoid discharge, pharynx normal Oral cavity: MMM, no lesions Neck: supple, no lymphadenopathy, no thyromegaly, no masses, no bruits Heart: RRR, normal S1, S2, no murmurs Lungs: CTA bilaterally, no wheezes, rhonchi, or rales Abdomen: +bs, soft, non tender, non distended, no masses, no hepatomegaly, no splenomegaly Pulses: 2+ radial pulses, 2+ pedal pulses, normal cap refill Ext: no edema Neuro: non focal exam    Assessment: Encounter Diagnoses  Name Primary?  . Moderate persistent asthma without complication Yes  . History of sarcoidosis   . Nephrolithiasis   . Elevated blood pressure reading   . Frequent headaches   . Nasal sinus congestion      Plan: We discussed his symptoms, and there are multiple factors related to why his blood pressure may be elevated.  He has a history of sarcoidosis, no follow-up with pulmonology in years, asthma uncontrolled, poor diet, and may or may not have a true hypertension issue.   PFT was abnormal today, we will check labs today  Gave recommendations below and discussed follow-up  Recommendations:  Begin Symbicort  preventative inhaler 2 puffs twice a day  Make sure you rinse her mouth out with water after use of Symbicort and swallow  Begin back on albuterol rescue inhaler, 2 puffs every 4-6 hours as needed  It is not completely clear whether you have high blood pressure are not.  Continue to monitor your blood pressure readings like you are doing  We will check some labs today  Begin amoxicillin antibiotic for possible sinus infection given the bloody in mucous and headaches  I really want you to work on diet changes.   Fast food, fried foods, salty foods, soda, junk food, and animal products all contribute to high blood pressure.  You can start by eating less fast food and junk food, as this will likely help improve your blood pressure  Exercise regularly with walking or other type of exercise  Drink plenty of water every day, preferably 64 ounces or more a day  Let us plan to recheck in 2 weeks  If your blood pressures continue to stay elevated, you may end up needing other testing given history of sarcoidosis  [name] was seen today for headaches and b/p.  Diagnoses and all orders for this visit:  Moderate persistent asthma without complication  History of sarcoidosis  Nephrolithiasis  Elevated blood pressure reading  Frequent headaches  Nasal sinus congestion  He voices agreement and understanding of plan  Follow up: pending labs

## 2017-03-22 NOTE — Patient Instructions (Addendum)
Recommendations:  Begin Symbicort preventative inhaler 2 puffs twice a day  Make sure you rinse her mouth out with water after use of Symbicort and swallow  Begin back on albuterol rescue inhaler, 2 puffs every 4-6 hours as needed  It is not completely clear whether you have high blood pressure are not.  Continue to monitor your blood pressure readings like you are doing  We will check some labs today  Begin amoxicillin antibiotic for possible sinus infection given the bloody in mucous and headaches  I really want you to work on diet changes.  Fast food, fried foods, salty foods, soda, junk food, and animal products all contribute to high blood pressure.  You can start by eating less fast food and junk food, as this will likely help improve your blood pressure  Exercise regularly with walking or other type of exercise  Drink plenty of water every day, preferably 64 ounces or more a day  Let us plan to recheck in 2 weeks  If your blood pressures continue to stay elevated, you may end up needing other testing given history of sarcoidosis   DASH Eating Plan DASH stands for "Dietary Approaches to Stop Hypertension." The DASH eating plan is a healthy eating plan that has been shown to reduce high blood pressure (hypertension). Additional health benefits may include reducing the risk of type 2 diabetes mellitus, heart disease, and stroke. The DASH eating plan may also help with weight loss. WHAT DO I NEED TO KNOW ABOUT THE DASH EATING PLAN? For the DASH eating plan, you will follow these general guidelines:  Choose foods with a percent daily value for sodium of less than 5% (as listed on the food label).  Use salt-free seasonings or herbs instead of table salt or sea salt.  Check with your health care provider or pharmacist before using salt substitutes.  Eat lower-sodium products, often labeled as "lower sodium" or "no salt added."  Eat fresh foods.  Eat more vegetables, fruits,  and low-fat dairy products.  Choose whole grains. Look for the word "whole" as the first word in the ingredient list.  Choose fish and skinless chicken or [place] more often than red meat. Limit fish, poultry, and meat to 6 oz (170 g) each day.  Limit sweets, desserts, sugars, and sugary drinks.  Choose heart-healthy fats.  Limit cheese to 1 oz (28 g) per day.  Eat more home-cooked food and less restaurant, buffet, and fast food.  Limit fried foods.  Cook foods using methods other than frying.  Limit canned vegetables. If you do use them, rinse them well to decrease the sodium.  When eating at a restaurant, ask that your food be prepared with less salt, or no salt if possible. WHAT FOODS CAN I EAT? Seek help from a dietitian for individual calorie needs. Grains Whole grain or whole wheat bread. Brown rice. Whole grain or whole wheat pasta. Quinoa, bulgur, and whole grain cereals. Low-sodium cereals. Corn or whole wheat flour tortillas. Whole grain cornbread. Whole grain crackers. Low-sodium crackers. Vegetables Fresh or frozen vegetables (raw, steamed, roasted, or grilled). Low-sodium or reduced-sodium tomato and vegetable juices. Low-sodium or reduced-sodium tomato sauce and paste. Low-sodium or reduced-sodium canned vegetables.  Fruits All fresh, canned (in natural juice), or frozen fruits. Meat and Other Protein Products Ground beef (85% or leaner), grass-fed beef, or beef trimmed of fat. Skinless chicken or [place]. Ground chicken or [place]. Pork trimmed of fat. All fish and seafood. Eggs. Dried beans, peas, or lentils.  Unsalted nuts and seeds. Unsalted canned beans. Dairy Low-fat dairy products, such as skim or 1% milk, 2% or reduced-fat cheeses, low-fat ricotta or cottage cheese, or plain low-fat yogurt. Low-sodium or reduced-sodium cheeses. Fats and Oils Tub margarines without trans fats. Light or reduced-fat mayonnaise and salad dressings (reduced sodium). Avocado. Safflower,  olive, or canola oils. Natural peanut or almond butter. Other Unsalted popcorn and pretzels. The items listed above may not be a complete list of recommended foods or beverages. Contact your dietitian for more options. WHAT FOODS ARE NOT RECOMMENDED? Grains White bread. White pasta. White rice. Refined cornbread. Bagels and croissants. Crackers that contain trans fat. Vegetables Creamed or fried vegetables. Vegetables in a cheese sauce. Regular canned vegetables. Regular canned tomato sauce and paste. Regular tomato and vegetable juices. Fruits Dried fruits. Canned fruit in light or heavy syrup. Fruit juice. Meat and Other Protein Products Fatty cuts of meat. Ribs, chicken wings, bacon, sausage, bologna, salami, chitterlings, fatback, hot dogs, bratwurst, and packaged luncheon meats. Salted nuts and seeds. Canned beans with salt. Dairy Whole or 2% milk, cream, half-and-half, and cream cheese. Whole-fat or sweetened yogurt. Full-fat cheeses or blue cheese. Nondairy creamers and whipped toppings. Processed cheese, cheese spreads, or cheese curds. Condiments Onion and garlic salt, seasoned salt, table salt, and sea salt. Canned and packaged gravies. Worcestershire sauce. Tartar sauce. Barbecue sauce. Teriyaki sauce. Soy sauce, including reduced sodium. Steak sauce. Fish sauce. Oyster sauce. Cocktail sauce. Horseradish. Ketchup and mustard. Meat flavorings and tenderizers. Bouillon cubes. Hot sauce. Tabasco sauce. Marinades. Taco seasonings. Relishes. Fats and Oils Butter, stick margarine, lard, shortening, ghee, and bacon fat. Coconut, palm kernel, or palm oils. Regular salad dressings. Other Pickles and olives. Salted popcorn and pretzels. The items listed above may not be a complete list of foods and beverages to avoid. Contact your dietitian for more information. WHERE CAN I FIND MORE INFORMATION? National Heart, Lung, and Blood Institute:  [employer] Document Released: 03/11/2011 Document Revised: 08/06/2013 Document Reviewed: 01/24/2013 [facility] Patient Information 2015 [place], [place]. This information is not intended to replace advice given to you by your health care provider. Make sure you discuss any questions you have with your health care provider.        Why follow it? Research shows. . Those who follow the Mediterranean diet have a reduced risk of heart disease  . The diet is associated with a reduced incidence of Parkinson's and Alzheimer's diseases . People following the diet may have longer life expectancies and lower rates of chronic diseases  . The Dietary Guidelines for Americans recommends the Mediterranean diet as an eating plan to promote health and prevent disease  What Is the Mediterranean Diet?  . Healthy eating plan based on typical foods and recipes of Mediterranean-style cooking . The diet is primarily a plant based diet; these foods should make up a majority of meals   Starches - Plant based foods should make up a majority of meals - They are an important sources of vitamins, minerals, energy, antioxidants, and fiber - Choose whole grains, foods high in fiber and minimally processed items  - Typical grain sources include wheat, oats, barley, corn, brown rice, bulgar, farro, millet, polenta, couscous  - Various types of beans include chickpeas, lentils, fava beans, black beans, white beans   Fruits  Veggies - Large quantities of antioxidant rich fruits & veggies; 6 or more servings  - Vegetables can be eaten raw or lightly drizzled with oil and cooked  - Vegetables common to the  traditional Mediterranean Diet include: artichokes, arugula, beets, broccoli, brussel sprouts, cabbage, carrots, celery, collard greens, cucumbers, eggplant, kale, leeks, lemons, lettuce, mushrooms, okra, onions, peas, peppers, potatoes, pumpkin, radishes, rutabaga, shallots, spinach,  sweet potatoes, turnips, zucchini - Fruits common to the Mediterranean Diet include: apples, apricots, avocados, cherries, clementines, dates, figs, grapefruits, grapes, melons, nectarines, oranges, peaches, pears, pomegranates, strawberries, tangerines  Fats - Replace butter and margarine with healthy oils, such as olive oil, canola oil, and tahini  - Limit nuts to no more than a handful a day  - Nuts include walnuts, almonds, pecans, pistachios, pine nuts  - Limit or avoid candied, honey roasted or heavily salted nuts - Olives are central to the [employer]iterranean diet - can be eaten whole or used in a variety of dishes   Meats Protein - Limiting red meat: no more than a few times a month - When eating red meat: choose lean cuts and keep the portion to the size of deck of cards - Eggs: approx. 0 to 4 times a week  - Fish and lean poultry: at least 2 a week  - Healthy protein sources include, chicken, [place], lean beef, lamb - Increase intake of seafood such as tuna, salmon, trout, mackerel, shrimp, scallops - Avoid or limit high fat processed meats such as sausage and bacon  Dairy - Include moderate amounts of low fat dairy products  - Focus on healthy dairy such as fat free yogurt, skim milk, low or reduced fat cheese - Limit dairy products higher in fat such as whole or 2% milk, cheese, ice cream  Alcohol - Moderate amounts of red wine is ok  - No more than 5 oz daily for women (all ages) and men older than age 43  - No more than 10 oz of wine daily for men younger than 9265  Other - Limit sweets and other desserts  - Use herbs and spices instead of salt to flavor foods  - Herbs and spices common to the traditional Mediterranean Diet include: basil, bay leaves, chives, cloves, cumin, fennel, garlic, lavender, marjoram, mint, oregano, parsley, pepper, rosemary, sage, savory, sumac, tarragon, thyme   It's not just a diet, it's a lifestyle:  . The Mediterranean diet includes lifestyle factors  typical of those in the region  . Foods, drinks and meals are best eaten with others and savored . Daily physical activity is important for overall good health . This could be strenuous exercise like running and aerobics . This could also be more leisurely activities such as walking, housework, yard-work, or taking the stairs . Moderation is the key; a balanced and healthy diet accommodates most foods and drinks . Consider portion sizes and frequency of consumption of certain foods   Meal Ideas & Options:  . Breakfast:  o Whole wheat toast or whole wheat English muffins with peanut butter & hard boiled egg o Steel cut oats topped with apples & cinnamon and skim milk  o Fresh fruit: banana, strawberries, melon, berries, peaches  o Smoothies: strawberries, bananas, greek yogurt, peanut butter o Low fat greek yogurt with blueberries and granola  o Egg white omelet with spinach and mushrooms o Breakfast couscous: whole wheat couscous, apricots, skim milk, cranberries  . Sandwiches:  o Hummus and grilled vegetables (peppers, zucchini, squash) on whole wheat bread   o Grilled chicken on whole wheat pita with lettuce, tomatoes, cucumbers or tzatziki  o Tuna salad on whole wheat bread: tuna salad made with greek yogurt, olives, red peppers, capers,  green onions o Garlic rosemary lamb pita: lamb sauted with garlic, rosemary, salt & pepper; add lettuce, cucumber, greek yogurt to pita - flavor with lemon juice and black pepper  . Seafood:  o Mediterranean grilled salmon, seasoned with garlic, basil, parsley, lemon juice and black pepper o Shrimp, lemon, and spinach whole-grain pasta salad made with low fat greek yogurt  o Seared scallops with lemon orzo  o Seared tuna steaks seasoned salt, pepper, coriander topped with tomato mixture of olives, tomatoes, olive oil, minced garlic, parsley, green onions and cappers  . Meats:  o Herbed greek chicken salad with kalamata olives, cucumber, feta  o Red  bell peppers stuffed with spinach, bulgur, lean ground beef (or lentils) & topped with feta   o Kebabs: skewers of chicken, tomatoes, onions, zucchini, squash  o [place] burgers: made with red onions, mint, dill, lemon juice, feta cheese topped with roasted red peppers . Vegetarian o Cucumber salad: cucumbers, artichoke hearts, celery, red onion, feta cheese, tossed in olive oil & lemon juice  o Hummus and whole grain pita points with a greek salad (lettuce, tomato, feta, olives, cucumbers, red onion) o Lentil soup with celery, carrots made with vegetable broth, garlic, salt and pepper  o Tabouli salad: parsley, bulgur, mint, scallions, cucumbers, tomato, radishes, lemon juice, olive oil, salt and pepper.

## 2017-03-23 ENCOUNTER — Other Ambulatory Visit: Payer: Self-pay | Admitting: Medical

## 2017-03-23 MED ORDER — AMLODIPINE BESYLATE 5 MG PO TABS: 5.0000 mg | ORAL_TABLET | Freq: Every day | ORAL | 2 refills | Status: DC

## 2017-03-31 DIAGNOSIS — M5412 Radiculopathy, cervical region: Secondary | ICD-10-CM | POA: Diagnosis not present

## 2017-04-07 ENCOUNTER — Encounter: Payer: Self-pay | Admitting: Medical

## 2017-04-07 ENCOUNTER — Ambulatory Visit: Payer: BLUE CROSS/BLUE SHIELD | Admitting: Medical

## 2017-04-07 VITALS — BP 132/84 | HR 87 | Wt 194.2 lb

## 2017-04-07 DIAGNOSIS — Z862 Personal history of diseases of the blood and blood-forming organs and certain disorders involving the immune mechanism: Secondary | ICD-10-CM | POA: Diagnosis not present

## 2017-04-07 DIAGNOSIS — I10 Essential (primary) hypertension: Secondary | ICD-10-CM | POA: Diagnosis not present

## 2017-04-07 DIAGNOSIS — J4540 Moderate persistent asthma, uncomplicated: Secondary | ICD-10-CM

## 2017-04-07 DIAGNOSIS — N289 Disorder of kidney and ureter, unspecified: Secondary | ICD-10-CM | POA: Insufficient documentation

## 2017-04-07 DIAGNOSIS — G4489 Other headache syndrome: Secondary | ICD-10-CM

## 2017-04-07 DIAGNOSIS — N189 Chronic kidney disease, unspecified: Secondary | ICD-10-CM

## 2017-04-07 DIAGNOSIS — M5412 Radiculopathy, cervical region: Secondary | ICD-10-CM | POA: Diagnosis not present

## 2017-04-07 DIAGNOSIS — R809 Proteinuria, unspecified: Secondary | ICD-10-CM | POA: Diagnosis not present

## 2017-04-07 MED ORDER — ALBUTEROL SULFATE HFA 108 (90 BASE) MCG/ACT IN AERS: 2.0000 | INHALATION_SPRAY | Freq: Four times a day (QID) | RESPIRATORY_TRACT | 0 refills | Status: DC | PRN

## 2017-04-07 MED ORDER — AMLODIPINE BESYLATE 5 MG PO TABS: 5.0000 mg | ORAL_TABLET | Freq: Every day | ORAL | 1 refills | Status: DC

## 2017-04-07 NOTE — Progress Notes (Signed)
Subjective: Chief Complaint  Patient presents with  . Follow-up    2 nweek follow up     [name] is a 41 y.o. male who presents for recheck.  I saw him 03/22/17 for several concerns.  He has a history of asthma and history of sarcoidosis.  At last visit I asked him to start trial of Symbicort, however he is only using albuterol 1 puff twice a day.  He denies any significant problems with breathing in the last couple weeks  He did start amlodipine 5 mg daily for blood pressure and all his recent home numbers look good.  Denies any side effects of the medication.  No chest pain, no palpitations, no edema  His headaches are not improved from last visit but he thinks this is attributed to his pinched nerve in his upper back.   [facility], Dr. Wynetta Emery as well as Dr. Alvester Morin.   He has started working on diet improvement since we talked last visit.    No other aggravating or relieving factors.    No other c/o.  The following portions of the patient's history were reviewed and updated as appropriate: allergies, current medications, past family history, past medical history, past social history, past surgical history and problem list.  ROS Otherwise as in subjective above   Past Medical History:  Diagnosis Date  . Asthma    last asthma attack last year in 2012  . Hearing impairment    wears hearing aids  . History of kidney stones   . Hypertension    off bp meds  last 2 years   . Renal stone   . Sarcoidosis   . Wears glasses    Current Outpatient Medications on File Prior to Visit  Medication Sig Dispense Refill  . albuterol (PROVENTIL HFA;VENTOLIN HFA) 108 (90 Base) MCG/ACT inhaler Inhale 2 puffs into the lungs every 6 (six) hours as needed for wheezing or shortness of breath. 1 Inhaler 0  . amLODipine (NORVASC) 5 MG tablet Take 1 tablet (5 mg total) by mouth daily. 30 tablet 2  . budesonide-formoterol (SYMBICORT) 160-4.5 MCG/ACT inhaler Inhale 2 puffs into the  lungs 2 (two) times daily. 1 Inhaler 2   No current facility-administered medications on file prior to visit.    ROS as in subjective    Objective:Exam BP 132/84   Pulse 87   Wt 194 lb 3.2 oz (88.1 kg)   SpO2 99%   BMI 27.09 kg/m   Wt Readings from Last 3 Encounters:  04/07/17 194 lb 3.2 oz (88.1 kg)  03/22/17 184 lb (83.5 kg)  10/11/16 173 lb (78.5 kg)   BP Readings from Last 3 Encounters:  04/07/17 132/84  03/22/17 128/80  01/25/17 (!) 150/100    General appearance: alert, no distress, WD/WN, lean AA male Neck: supple, no lymphadenopathy, no thyromegaly, no masses, no bruits Heart: RRR, normal S1, S2, no murmurs Lungs: CTA bilaterally, no wheezes, rhonchi, or rales Pulses: 2+ radial pulses, 2+ pedal pulses, normal cap refill Ext: no edema Neuro: non focal exam    Assessment: Encounter Diagnoses  Name Primary?  . Essential hypertension, benign Yes  . Moderate persistent asthma without complication   . Proteinuria, unspecified type   . Chronic kidney disease, unspecified CKD stage   . History of sarcoidosis   . Cervical radiculopathy   . Headache syndrome      Plan: Hypertension-blood pressures improved on amlodipine 5 mg started last visit  Asthma-improved but he did not  follow the directions from last visit so we discussed them again.  The main counseling today was to continue working on diet and exercise changes as discussed  Given his recent lab findings we will follow-up in 4-6 months to monitor labs for preventative care visit  We discussed the patient instructions below  Patient Instructions  Recommendations:  Your blood pressure looks better today, continue Amlodipine 5 mg daily  I would like you to begin Symbicort preventative inhaler 2 puffs twice a day.  This is to reduce inflammation in the lungs to help prevent cough or shortness of breath or wheezing  Rinse your mouth out with water after using Symbicort  You also can use the  albuterol rescue inhaler as needed but begin the trial of Symbicort to see if this cuts down on breathing issues  Continue routine follow-up with your neck/back doctor regarding your pinched nerve  I recommend you exercise daily with walking or elliptical machine or bike or other aerobic exercise  I recommended daily stretching routine as well  Let me know how you are doing on Symbicort within the next 2-3 weeks.   Specific recommendations today include: Diet Increase your water intake, get at least 64 ounces of water daily Eat 3-4 fruits daily Eat plenty of vegetables throughout the day, preferably each meal Eat good sources of grains such as oatmeal, barley, whole grain pasta, whole grain bread, but limit the serving size to 1 cup of oatmeal or pasta per meal or 2 slices of bread per meal We don't need to meat at each meal, however if you do eat meat, limit serving size to the size of your palm, and eat chicken fish or [place], lean cuts of meat Eat beans every day as this is a good nutrient source and helps to curb appetite  Things to limit or avoid: Avoid fast food, fried foods, fatty foods Limit sweets, ice cream, cake and other baked goods Avoid soda, beer, alcohol, sweet tea   Let us plan to have you come in for a physical/preventive care visit in 4-6 months   He voices agreement and understanding of plan  Follow up: 4-6 mo

## 2017-04-07 NOTE — Addendum Note (Signed)
Addended by: Jac CanavanYSINGER, Roderic Lammert S on: 04/07/2017 08:52 AM   Modules accepted: Orders

## 2017-04-07 NOTE — Patient Instructions (Addendum)
Recommendations:  Your blood pressure looks better today, continue Amlodipine 5 mg daily  I would like you to begin Symbicort preventative inhaler 2 puffs twice a day.  This is to reduce inflammation in the lungs to help prevent cough or shortness of breath or wheezing  Rinse your mouth out with water after using Symbicort  You also can use the albuterol rescue inhaler as needed but begin the trial of Symbicort to see if this cuts down on breathing issues  Continue routine follow-up with your neck/back doctor regarding your pinched nerve  I recommend you exercise daily with walking or elliptical machine or bike or other aerobic exercise  I recommended daily stretching routine as well  Let me know how you are doing on Symbicort within the next 2-3 weeks.   Specific recommendations today include: Diet Increase your water intake, get at least 64 ounces of water daily Eat 3-4 fruits daily Eat plenty of vegetables throughout the day, preferably each meal Eat good sources of grains such as oatmeal, barley, whole grain pasta, whole grain bread, but limit the serving size to 1 cup of oatmeal or pasta per meal or 2 slices of bread per meal We don't need to meat at each meal, however if you do eat meat, limit serving size to the size of your palm, and eat chicken fish or [place], lean cuts of meat Eat beans every day as this is a good nutrient source and helps to curb appetite  Things to limit or avoid: Avoid fast food, fried foods, fatty foods Limit sweets, ice cream, cake and other baked goods Avoid soda, beer, alcohol, sweet tea   Let us plan to have you come in for a physical/preventive care visit in 4-6 months

## 2017-04-14 ENCOUNTER — Other Ambulatory Visit: Payer: Self-pay | Admitting: Medical

## 2017-04-14 NOTE — Telephone Encounter (Signed)
Is this ok to refill?  

## 2017-04-18 ENCOUNTER — Telehealth: Payer: Self-pay | Admitting: Family Medicine

## 2017-04-20 NOTE — Telephone Encounter (Signed)
dt ?

## 2017-04-29 DIAGNOSIS — M4802 Spinal stenosis, cervical region: Secondary | ICD-10-CM | POA: Diagnosis not present

## 2017-04-29 DIAGNOSIS — M50123 Cervical disc disorder at C6-C7 level with radiculopathy: Secondary | ICD-10-CM | POA: Diagnosis not present

## 2017-04-29 DIAGNOSIS — M4808 Spinal stenosis, sacral and sacrococcygeal region: Secondary | ICD-10-CM | POA: Diagnosis not present

## 2017-04-29 DIAGNOSIS — M50223 Other cervical disc displacement at C6-C7 level: Secondary | ICD-10-CM | POA: Diagnosis not present

## 2017-06-09 DIAGNOSIS — M5412 Radiculopathy, cervical region: Secondary | ICD-10-CM | POA: Diagnosis not present

## 2017-08-11 DIAGNOSIS — Z6826 Body mass index (BMI) 26.0-26.9, adult: Secondary | ICD-10-CM | POA: Diagnosis not present

## 2017-08-11 DIAGNOSIS — M545 Low back pain: Secondary | ICD-10-CM | POA: Diagnosis not present

## 2017-08-11 DIAGNOSIS — M5440 Lumbago with sciatica, unspecified side: Secondary | ICD-10-CM | POA: Diagnosis not present

## 2017-08-11 DIAGNOSIS — M5126 Other intervertebral disc displacement, lumbar region: Secondary | ICD-10-CM | POA: Diagnosis not present

## 2017-08-11 DIAGNOSIS — I10 Essential (primary) hypertension: Secondary | ICD-10-CM | POA: Diagnosis not present

## 2017-08-22 DIAGNOSIS — R262 Difficulty in walking, not elsewhere classified: Secondary | ICD-10-CM | POA: Diagnosis not present

## 2017-08-22 DIAGNOSIS — M6281 Muscle weakness (generalized): Secondary | ICD-10-CM | POA: Diagnosis not present

## 2017-08-22 DIAGNOSIS — M545 Low back pain: Secondary | ICD-10-CM | POA: Diagnosis not present

## 2017-08-22 DIAGNOSIS — M5126 Other intervertebral disc displacement, lumbar region: Secondary | ICD-10-CM | POA: Diagnosis not present

## 2017-08-25 DIAGNOSIS — M545 Low back pain: Secondary | ICD-10-CM | POA: Diagnosis not present

## 2017-08-25 DIAGNOSIS — M6281 Muscle weakness (generalized): Secondary | ICD-10-CM | POA: Diagnosis not present

## 2017-08-25 DIAGNOSIS — M5126 Other intervertebral disc displacement, lumbar region: Secondary | ICD-10-CM | POA: Diagnosis not present

## 2017-08-25 DIAGNOSIS — R262 Difficulty in walking, not elsewhere classified: Secondary | ICD-10-CM | POA: Diagnosis not present

## 2017-09-01 DIAGNOSIS — R262 Difficulty in walking, not elsewhere classified: Secondary | ICD-10-CM | POA: Diagnosis not present

## 2017-09-01 DIAGNOSIS — M5126 Other intervertebral disc displacement, lumbar region: Secondary | ICD-10-CM | POA: Diagnosis not present

## 2017-09-01 DIAGNOSIS — M6281 Muscle weakness (generalized): Secondary | ICD-10-CM | POA: Diagnosis not present

## 2017-09-01 DIAGNOSIS — M545 Low back pain: Secondary | ICD-10-CM | POA: Diagnosis not present

## 2017-09-06 DIAGNOSIS — M6281 Muscle weakness (generalized): Secondary | ICD-10-CM | POA: Diagnosis not present

## 2017-09-06 DIAGNOSIS — M545 Low back pain: Secondary | ICD-10-CM | POA: Diagnosis not present

## 2017-09-06 DIAGNOSIS — M5126 Other intervertebral disc displacement, lumbar region: Secondary | ICD-10-CM | POA: Diagnosis not present

## 2017-09-06 DIAGNOSIS — R262 Difficulty in walking, not elsewhere classified: Secondary | ICD-10-CM | POA: Diagnosis not present

## 2017-09-08 DIAGNOSIS — M5126 Other intervertebral disc displacement, lumbar region: Secondary | ICD-10-CM | POA: Diagnosis not present

## 2017-09-08 DIAGNOSIS — M545 Low back pain: Secondary | ICD-10-CM | POA: Diagnosis not present

## 2017-09-08 DIAGNOSIS — M6281 Muscle weakness (generalized): Secondary | ICD-10-CM | POA: Diagnosis not present

## 2017-09-08 DIAGNOSIS — R262 Difficulty in walking, not elsewhere classified: Secondary | ICD-10-CM | POA: Diagnosis not present

## 2017-09-13 DIAGNOSIS — M6281 Muscle weakness (generalized): Secondary | ICD-10-CM | POA: Diagnosis not present

## 2017-09-13 DIAGNOSIS — R262 Difficulty in walking, not elsewhere classified: Secondary | ICD-10-CM | POA: Diagnosis not present

## 2017-09-13 DIAGNOSIS — M545 Low back pain: Secondary | ICD-10-CM | POA: Diagnosis not present

## 2017-09-13 DIAGNOSIS — M5126 Other intervertebral disc displacement, lumbar region: Secondary | ICD-10-CM | POA: Diagnosis not present

## 2017-09-30 ENCOUNTER — Ambulatory Visit: Payer: BLUE CROSS/BLUE SHIELD | Admitting: Medical

## 2017-09-30 VITALS — BP 150/95 | HR 74 | Temp 97.7°F | Resp 16 | Ht 72.5 in | Wt 188.8 lb

## 2017-09-30 DIAGNOSIS — I10 Essential (primary) hypertension: Secondary | ICD-10-CM

## 2017-09-30 DIAGNOSIS — R202 Paresthesia of skin: Secondary | ICD-10-CM

## 2017-09-30 DIAGNOSIS — N189 Chronic kidney disease, unspecified: Secondary | ICD-10-CM

## 2017-09-30 DIAGNOSIS — Z862 Personal history of diseases of the blood and blood-forming organs and certain disorders involving the immune mechanism: Secondary | ICD-10-CM | POA: Diagnosis not present

## 2017-09-30 DIAGNOSIS — Z9119 Patient's noncompliance with other medical treatment and regimen: Secondary | ICD-10-CM

## 2017-09-30 DIAGNOSIS — Z91199 Patient's noncompliance with other medical treatment and regimen due to unspecified reason: Secondary | ICD-10-CM | POA: Insufficient documentation

## 2017-09-30 NOTE — Progress Notes (Signed)
Subjective: Chief Complaint  Patient presents with  . foot pain    bilateral foot numb toes feels like pins sticking in them X 3 weeks   bug bite    Here with wife.  Been having numbness in ends of toes bilat feet x 3 weeks.  No recent trauma, injury or fall.  No weakness, no other numbness or tinging.  No CP, no dyspnea.  HTN - he decide to quit taking his BP medication few months ago (amlodipine).   Had been on Diovan in past.  He thought he didn't need it any more  He has hx/o sarcoidosis, but no recent issues.  No recent lung issues in general with asthma.    Objective: BP (!) 150/95   Pulse 74   Temp 97.7 F (36.5 C) (Oral)   Resp 16   Ht 6' 0.5" (1.842 m)   Wt 188 lb 12.8 oz (85.6 kg)   SpO2 98%   BMI 25.25 kg/m   BP Readings from Last 3 Encounters:  09/30/17 (!) 150/95  04/07/17 132/84  03/22/17 128/80   General appearance: alert, no distress, WD/WN,  HEENT: normocephalic, sclerae anicteric, TMs pearly, nares patent, no discharge or erythema, pharynx normal Oral cavity: MMM, no lesions Neck: supple, no lymphadenopathy, no thyromegaly, no masses Heart: RRR, normal S1, S2, no murmurs Lungs: CTA bilaterally, no wheezes, rhonchi, or rales Abdomen: +bs, soft, non tender, non distended, no masses, no hepatomegaly, no splenomegaly Pulses: 2+ symmetric, upper and lower extremities, normal cap refill    Assessment: Encounter Diagnoses  Name Primary?  . Essential hypertension, benign Yes  . Paresthesia   . Chronic kidney disease, unspecified CKD stage   . History of sarcoidosis   . Noncompliance      Plan: HTN - discussed diagnosis, possible complications, need for complication.  Pending labs restart either amlodipine or ACEi.    paresthesia - labs today.  Consider referral to neurology   CKD - labs today   Hx/o sarcoidosis  noncompliant on anti-hypertensive medication   [name] was seen today for foot pain.  Diagnoses and all orders for this  visit:  Essential hypertension, benign -     Hemoglobin A1c -     Vitamin B12 -     Renal Function Panel -     Hepatic function panel  Paresthesia -     Hemoglobin A1c -     Vitamin B12 -     Renal Function Panel -     Hepatic function panel  Chronic kidney disease, unspecified CKD stage -     Hemoglobin A1c -     Vitamin B12 -     Renal Function Panel -     Hepatic function panel  History of sarcoidosis -     Hemoglobin A1c -     Vitamin B12 -     Renal Function Panel -     Hepatic function panel  Noncompliance

## 2017-10-01 LAB — RENAL FUNCTION PANEL
Albumin: 4.7 g/dL (ref 3.5–5.5)
BUN/Creatinine Ratio: 9 (ref 9–20)
BUN: 13 mg/dL (ref 6–24)
CO2: 26 mmol/L (ref 20–29)
Calcium: 9.8 mg/dL (ref 8.7–10.2)
Chloride: 103 mmol/L (ref 96–106)
Creatinine, Ser: 1.44 mg/dL — ABNORMAL HIGH (ref 0.76–1.27)
GFR, EST AFRICAN AMERICAN: 68 mL/min/{1.73_m2} (ref >59)
GFR, EST NON AFRICAN AMERICAN: 59 mL/min/{1.73_m2} — AB (ref >59)
GLUCOSE: 90 mg/dL (ref 65–99)
POTASSIUM: 4.3 mmol/L (ref 3.5–5.2)
Phosphorus: 2.4 mg/dL — ABNORMAL LOW (ref 2.5–4.5)
Sodium: 140 mmol/L (ref 134–144)

## 2017-10-01 LAB — HEPATIC FUNCTION PANEL
ALT: 18 IU/L (ref 0–44)
AST: 26 IU/L (ref 0–40)
Alkaline Phosphatase: 67 IU/L (ref 39–117)
Bilirubin Total: 0.8 mg/dL (ref 0.0–1.2)
Bilirubin, Direct: 0.18 mg/dL (ref 0.00–0.40)
Total Protein: 8 g/dL (ref 6.0–8.5)

## 2017-10-01 LAB — HEMOGLOBIN A1C
ESTIMATED AVERAGE GLUCOSE: 108 mg/dL
HEMOGLOBIN A1C: 5.4 % (ref 4.8–5.6)

## 2017-10-01 LAB — VITAMIN B12: VITAMIN B 12: 951 pg/mL (ref 232–1245)

## 2017-10-03 ENCOUNTER — Other Ambulatory Visit: Payer: Self-pay | Admitting: Medical

## 2017-10-03 MED ORDER — VALSARTAN 40 MG PO TABS: 40.0000 mg | ORAL_TABLET | Freq: Every day | ORAL | 2 refills | Status: DC

## 2017-10-04 DIAGNOSIS — M5412 Radiculopathy, cervical region: Secondary | ICD-10-CM | POA: Diagnosis not present

## 2017-10-04 DIAGNOSIS — M5126 Other intervertebral disc displacement, lumbar region: Secondary | ICD-10-CM | POA: Diagnosis not present

## 2017-10-11 ENCOUNTER — Ambulatory Visit: Payer: BLUE CROSS/BLUE SHIELD | Admitting: Medical

## 2017-10-11 VITALS — BP 150/98 | HR 83 | Temp 98.1°F | Resp 16 | Ht 73.0 in | Wt 192.8 lb

## 2017-10-11 DIAGNOSIS — L255 Unspecified contact dermatitis due to plants, except food: Secondary | ICD-10-CM

## 2017-10-11 MED ORDER — TRIAMCINOLONE ACETONIDE 0.1 % EX CREA: 1.0000 "application " | TOPICAL_CREAM | Freq: Two times a day (BID) | CUTANEOUS | 0 refills | Status: DC

## 2017-10-11 MED ORDER — PREDNISONE 20 MG PO TABS: ORAL_TABLET | ORAL | 0 refills | Status: DC

## 2017-10-11 NOTE — Progress Notes (Signed)
  Subjective:   [name] is a 42 y.o. male who presents for evaluation of a rash involving arms, neck.. Rash started 2 days ago. Lesions are pink, and raised in texture. Rash has changed over time. Rash is pruritic. Associated symptoms: none. Patient denies: fever, headache, myalgia and nausea. Patient has not had contacts with similar rash. Patient has had new exposures - was weeding and using chipper for weeds/limbs this past weekend.  The following portions of the patient's history were reviewed and updated as appropriate: allergies, current medications, past family history, past medical history, past social history and problem list.  Review of Systems As in subjective above   Objective:   Gen: wd, wn, nad Skin: Scattered pink papular lesions along the neck circumferentially and forearms bilaterally few similar spots on chest and back   Assessment:   Encounter Diagnosis  Name Primary?  . Dermatitis due to plants Yes     Plan:   Discussed symptoms and exam findings.  Etiology most likely irritant and contact dermatitis from plants.  Patient Instructions  Your rash suggests hypersensitive to recent exposure to limbs and shrubs using the chipper  Recommendations:  Begin 25 mg Benadryl at bed time for 3-5 days  You can use non drowsy Claritin in the day time for 3-5 days  Wash your body with soap and water  Whatever gloves, boots, clothes you had on Saturday, make sure they get washed with hot soapy water  Begin prednisone oral tablet for allergic reaction  You can use Triamcinolone cream topically for the next few days for rash.  don't use this on the face     Follow up prn

## 2017-10-11 NOTE — Patient Instructions (Addendum)
Your rash suggests hypersensitive to recent exposure to limbs and shrubs using the chipper  Recommendations:  Begin 25 mg Benadryl at bed time for 3-5 days  You can use non drowsy Claritin in the day time for 3-5 days  Wash your body with soap and water  Whatever gloves, boots, clothes you had on Saturday, make sure they get washed with hot soapy water  Begin prednisone oral tablet for allergic reaction  You can use Triamcinolone cream topically for the next few days for rash.  don't use this on the face

## 2017-10-18 ENCOUNTER — Ambulatory Visit: Payer: BLUE CROSS/BLUE SHIELD | Admitting: Medical

## 2017-10-18 VITALS — BP 140/90 | HR 80 | Temp 97.9°F | Resp 16 | Ht 72.0 in | Wt 192.8 lb

## 2017-10-18 DIAGNOSIS — G4489 Other headache syndrome: Secondary | ICD-10-CM

## 2017-10-18 DIAGNOSIS — I10 Essential (primary) hypertension: Secondary | ICD-10-CM

## 2017-10-18 NOTE — Progress Notes (Signed)
Subjective Chief Complaint  Patient presents with  . headache    headache, congestion X 4 days   Here for headache.  He reports 4-day history of headache, head congestion, mucus production, but blames to headaches on his blood pressure medicine that he just started recently within the last week.  He denies fever, nausea, vomiting, sore throat, ear pressure.  Using Tylenol with no improvement.  He is taking the Diovan recently started.  He had been on this in the past and had no issues with this prior but has not been good about taking his blood pressure medication in the past.  Denies numbness, tingling, weakness, vision changes, chest pain, palpitations, edema.   Past Medical History:  Diagnosis Date  . Asthma    last asthma attack last year in 2012  . Hearing impairment    wears hearing aids  . History of kidney stones   . Hypertension    off bp meds  last 2 years   . Renal stone   . Sarcoidosis   . Wears glasses    Current Outpatient Medications on File Prior to Visit  Medication Sig Dispense Refill  . albuterol (PROVENTIL HFA;VENTOLIN HFA) 108 (90 Base) MCG/ACT inhaler Inhale 2 puffs into the lungs every 6 (six) hours as needed for wheezing or shortness of breath. 1 Inhaler 0  . budesonide-formoterol (SYMBICORT) 160-4.5 MCG/ACT inhaler Inhale 2 puffs into the lungs 2 (two) times daily. 1 Inhaler 2  . triamcinolone cream (KENALOG) 0.1 % Apply 1 application topically 2 (two) times daily. 30 g 0  . valsartan (DIOVAN) 40 MG tablet Take 1 tablet (40 mg total) by mouth daily. 30 tablet 2   No current facility-administered medications on file prior to visit.    ROS as in subjective    Objective: BP 140/90   Pulse 80   Temp 97.9 F (36.6 C) (Oral)   Resp 16   Ht 6' (1.829 m)   Wt 192 lb 12.8 oz (87.5 kg)   SpO2 98%   BMI 26.15 kg/m   Wt Readings from Last 3 Encounters:  10/18/17 192 lb 12.8 oz (87.5 kg)  10/11/17 192 lb 12.8 oz (87.5 kg)  09/30/17 188 lb 12.8 oz (85.6  kg)   BP Readings from Last 3 Encounters:  10/18/17 140/90  10/11/17 (!) 150/98  09/30/17 (!) 150/95    General appearance: alert, no distress, WD/WN,  HEENT: normocephalic, sclerae anicteric, TMs pearly, nares with erythema, mucoid discharge, pharynx normal Oral cavity: MMM, no lesions Neck: supple, no lymphadenopathy, no thyromegaly, no masses Heart: RRR, normal S1, S2, no murmurs Lungs: CTA bilaterally, no wheezes, rhonchi, or rales Pulses: 2+ symmetric, upper and lower extremities, normal cap refill Neuro: cn2-12 intact, nonfocal exam    Assessment: Encounter Diagnoses  Name Primary?  . Headache syndrome Yes  . Essential hypertension, benign      Plan:  Discussed his symptoms and concerns.  Gave the recommendations below  Patient Instructions  Recommendations:  Begin either Mucinex or Benadryl over-the-counter for the next 3 to 5 days  Increase her water intake in general, make sure your urine is clear, take breaks when needed on the job so you do not get dehydrated or overheated out in the hot sun  Continue your blood pressure medicine, monitor your blood pressure  Goal is 120/70  You can use Tylenol for the next few days for headache along with taking the Mucinex or Benadryl for sinus symptoms  If not much improved within the  next 48 hours none let me know  Let us plan to recheck on blood pressure in about 3 or 4 weeks as well as recheck the kidney lab at that time   [name] was seen today for headache.  Diagnoses and all orders for this visit:  Headache syndrome  Essential hypertension, benign  f/u 3-4 wk on BP

## 2017-10-18 NOTE — Patient Instructions (Signed)
Recommendations:  Begin either Mucinex or Benadryl over-the-counter for the next 3 to 5 days  Increase her water intake in general, make sure your urine is clear, take breaks when needed on the job so you do not get dehydrated or overheated out in the hot sun  Continue your blood pressure medicine, monitor your blood pressure  Goal is 120/70  You can use Tylenol for the next few days for headache along with taking the Mucinex or Benadryl for sinus symptoms  If not much improved within the next 48 hours none let me know  Let us plan to recheck on blood pressure in about 3 or 4 weeks as well as recheck the kidney lab at that time

## 2017-10-27 DIAGNOSIS — M542 Cervicalgia: Secondary | ICD-10-CM | POA: Diagnosis not present

## 2017-10-27 DIAGNOSIS — M50222 Other cervical disc displacement at C5-C6 level: Secondary | ICD-10-CM | POA: Diagnosis not present

## 2017-10-27 DIAGNOSIS — M5412 Radiculopathy, cervical region: Secondary | ICD-10-CM | POA: Diagnosis not present

## 2017-10-27 DIAGNOSIS — M50221 Other cervical disc displacement at C4-C5 level: Secondary | ICD-10-CM | POA: Diagnosis not present

## 2017-10-27 DIAGNOSIS — M5416 Radiculopathy, lumbar region: Secondary | ICD-10-CM | POA: Diagnosis not present

## 2017-10-28 DIAGNOSIS — Z6825 Body mass index (BMI) 25.0-25.9, adult: Secondary | ICD-10-CM | POA: Diagnosis not present

## 2017-10-28 DIAGNOSIS — M5416 Radiculopathy, lumbar region: Secondary | ICD-10-CM | POA: Diagnosis not present

## 2017-10-28 DIAGNOSIS — M5126 Other intervertebral disc displacement, lumbar region: Secondary | ICD-10-CM | POA: Diagnosis not present

## 2017-10-28 DIAGNOSIS — I10 Essential (primary) hypertension: Secondary | ICD-10-CM | POA: Diagnosis not present

## 2017-11-17 ENCOUNTER — Ambulatory Visit: Payer: BLUE CROSS/BLUE SHIELD | Admitting: Medical

## 2017-11-17 ENCOUNTER — Encounter: Payer: Self-pay | Admitting: Medical

## 2017-11-17 VITALS — BP 148/104 | HR 72 | Temp 97.6°F | Ht 72.0 in | Wt 190.4 lb

## 2017-11-17 DIAGNOSIS — N189 Chronic kidney disease, unspecified: Secondary | ICD-10-CM

## 2017-11-17 DIAGNOSIS — I10 Essential (primary) hypertension: Secondary | ICD-10-CM | POA: Diagnosis not present

## 2017-11-17 DIAGNOSIS — Z2821 Immunization not carried out because of patient refusal: Secondary | ICD-10-CM | POA: Insufficient documentation

## 2017-11-17 DIAGNOSIS — Z13220 Encounter for screening for lipoid disorders: Secondary | ICD-10-CM

## 2017-11-17 DIAGNOSIS — R12 Heartburn: Secondary | ICD-10-CM | POA: Diagnosis not present

## 2017-11-17 DIAGNOSIS — Z125 Encounter for screening for malignant neoplasm of prostate: Secondary | ICD-10-CM | POA: Insufficient documentation

## 2017-11-17 LAB — LIPID PANEL
CHOLESTEROL TOTAL: 168 mg/dL (ref 100–199)
Chol/HDL Ratio: 4.1 ratio (ref 0.0–5.0)
HDL: 41 mg/dL (ref >39)
LDL CALC: 97 mg/dL (ref 0–99)
TRIGLYCERIDES: 152 mg/dL — AB (ref 0–149)
VLDL Cholesterol Cal: 30 mg/dL (ref 5–40)

## 2017-11-17 LAB — BASIC METABOLIC PANEL
BUN/Creatinine Ratio: 10 (ref 9–20)
BUN: 13 mg/dL (ref 6–24)
CHLORIDE: 104 mmol/L (ref 96–106)
CO2: 23 mmol/L (ref 20–29)
CREATININE: 1.36 mg/dL — AB (ref 0.76–1.27)
Calcium: 9.6 mg/dL (ref 8.7–10.2)
GFR calc Af Amer: 73 mL/min/{1.73_m2} (ref >59)
GFR calc non Af Amer: 63 mL/min/{1.73_m2} (ref >59)
GLUCOSE: 91 mg/dL (ref 65–99)
Potassium: 4.3 mmol/L (ref 3.5–5.2)
SODIUM: 141 mmol/L (ref 134–144)

## 2017-11-17 NOTE — Progress Notes (Addendum)
Subjective: Chief Complaint  Patient presents with  . Hyperlipidemia    cholesterol check    Here for follow-up on blood pressure and cholesterol.  Accompanied by wife today  I started him back on Diovan 09/30/2017 for hypertension and renal protection.  Prior to this he had been reluctant to start medication despite persistently elevated blood pressure.  His creatinine has been elevated recently.  He notes after taking diovan in the morning gets heartburn.  He also eats late in the evening, uses some diet discretion.   Not sure how much salt in diet but does add some salt to food.    He is here for fasting lipid panel as well  Past Medical History:  Diagnosis Date  . Asthma    last asthma attack last year in 2012  . Hearing impairment    wears hearing aids  . History of kidney stones   . Hypertension    off bp meds  last 2 years   . Renal stone   . Sarcoidosis   . Wears glasses    Current Outpatient Medications on File Prior to Visit  Medication Sig Dispense Refill  . albuterol (PROVENTIL HFA;VENTOLIN HFA) 108 (90 Base) MCG/ACT inhaler Inhale 2 puffs into the lungs every 6 (six) hours as needed for wheezing or shortness of breath. 1 Inhaler 0  . budesonide-formoterol (SYMBICORT) 160-4.5 MCG/ACT inhaler Inhale 2 puffs into the lungs 2 (two) times daily. 1 Inhaler 2  . diazepam (VALIUM) 5 MG tablet TAKE 1 - 2 TABLETS BY MOUTH 1 HOUR PRIOR TO PROCEDURE  0  . valsartan (DIOVAN) 40 MG tablet Take 1 tablet (40 mg total) by mouth daily. 30 tablet 2   No current facility-administered medications on file prior to visit.    ROS as in subjective    Objective: BP (!) 148/104   Pulse 72   Temp 97.6 F (36.4 C) (Oral)   Ht 6' (1.829 m)   Wt 190 lb 6.4 oz (86.4 kg)   SpO2 99%   BMI 25.82 kg/m   Wt Readings from Last 3 Encounters:  11/17/17 190 lb 6.4 oz (86.4 kg)  10/18/17 192 lb 12.8 oz (87.5 kg)  10/11/17 192 lb 12.8 oz (87.5 kg)   BP Readings from Last 3 Encounters:   11/17/17 (!) 148/104  10/18/17 140/90  10/11/17 (!) 150/98   General appearance: alert, no distress, WD/WN,  Neck: supple, no lymphadenopathy, no thyromegaly, no masses Heart: RRR, normal S1, S2, no murmurs Lungs: CTA bilaterally, no wheezes, rhonchi, or rales Abdomen: +bs, soft, non tender, non distended, no masses, no hepatomegaly, no splenomegaly Pulses: 2+ symmetric, upper and lower extremities, normal cap refill    Assessment: Encounter Diagnoses  Name Primary?  . Essential hypertension, benign Yes  . Chronic kidney disease, unspecified CKD stage   . Screening for lipid disorders   . Heartburn   . Influenza vaccination declined      Plan: Blood pressure not at goal.  He will double up and take two Diovan 40 mg tablets daily to equal 80 mg daily.  He will monitor blood pressures at home call blood pressure readings in 2 weeks.   if his heartburn gets worse ( which I do not think is related to the medicine) but nevertheless we can change to something else if this becomes a problem.  Counseled on diet and exercise and salt intake.  Limit salt, decrease below 2 g of salt a day in the diet discussed sources of salt  in the diet  Labs today for lipid and BMET  He declines flu shot today  Heartburn-counseled on not eating late in the evening, avoid acidic or spicy foods and see if this helps   [name] was seen today for hyperlipidemia.  Diagnoses and all orders for this visit:  Essential hypertension, benign -     Basic metabolic panel -     Lipid panel  Chronic kidney disease, unspecified CKD stage -     Basic metabolic panel  Screening for lipid disorders -     Lipid panel  Heartburn  Influenza vaccination declined

## 2017-11-21 ENCOUNTER — Other Ambulatory Visit: Payer: Self-pay | Admitting: Neurosurgery

## 2017-11-21 DIAGNOSIS — M5416 Radiculopathy, lumbar region: Secondary | ICD-10-CM

## 2017-11-28 ENCOUNTER — Inpatient Hospital Stay
Admission: RE | Admit: 2017-11-28 | Discharge: 2017-11-28 | Disposition: A | Discharge status: Home / Self Care | Payer: BLUE CROSS/BLUE SHIELD | Source: Ambulatory Visit | Attending: Neurosurgery | Admitting: Neurosurgery

## 2017-11-29 ENCOUNTER — Other Ambulatory Visit: Payer: BLUE CROSS/BLUE SHIELD

## 2017-12-07 DIAGNOSIS — R509 Fever, unspecified: Secondary | ICD-10-CM | POA: Diagnosis not present

## 2017-12-07 DIAGNOSIS — J029 Acute pharyngitis, unspecified: Secondary | ICD-10-CM | POA: Diagnosis not present

## 2017-12-07 DIAGNOSIS — B9789 Other viral agents as the cause of diseases classified elsewhere: Secondary | ICD-10-CM | POA: Diagnosis not present

## 2017-12-27 DIAGNOSIS — M6281 Muscle weakness (generalized): Secondary | ICD-10-CM | POA: Diagnosis not present

## 2017-12-27 DIAGNOSIS — M5416 Radiculopathy, lumbar region: Secondary | ICD-10-CM | POA: Diagnosis not present

## 2017-12-29 DIAGNOSIS — M6281 Muscle weakness (generalized): Secondary | ICD-10-CM | POA: Diagnosis not present

## 2017-12-29 DIAGNOSIS — M5416 Radiculopathy, lumbar region: Secondary | ICD-10-CM | POA: Diagnosis not present

## 2018-01-02 ENCOUNTER — Other Ambulatory Visit: Payer: BLUE CROSS/BLUE SHIELD

## 2018-01-02 ENCOUNTER — Other Ambulatory Visit: Payer: Self-pay | Admitting: Medical

## 2018-01-04 DIAGNOSIS — M6281 Muscle weakness (generalized): Secondary | ICD-10-CM | POA: Diagnosis not present

## 2018-01-04 DIAGNOSIS — M5416 Radiculopathy, lumbar region: Secondary | ICD-10-CM | POA: Diagnosis not present

## 2018-01-06 DIAGNOSIS — M6281 Muscle weakness (generalized): Secondary | ICD-10-CM | POA: Diagnosis not present

## 2018-01-06 DIAGNOSIS — M5416 Radiculopathy, lumbar region: Secondary | ICD-10-CM | POA: Diagnosis not present

## 2018-01-10 DIAGNOSIS — M5416 Radiculopathy, lumbar region: Secondary | ICD-10-CM | POA: Diagnosis not present

## 2018-01-10 DIAGNOSIS — M6281 Muscle weakness (generalized): Secondary | ICD-10-CM | POA: Diagnosis not present

## 2018-01-13 DIAGNOSIS — M6281 Muscle weakness (generalized): Secondary | ICD-10-CM | POA: Diagnosis not present

## 2018-01-13 DIAGNOSIS — M5416 Radiculopathy, lumbar region: Secondary | ICD-10-CM | POA: Diagnosis not present

## 2018-01-16 DIAGNOSIS — M5416 Radiculopathy, lumbar region: Secondary | ICD-10-CM | POA: Diagnosis not present

## 2018-01-16 DIAGNOSIS — M6281 Muscle weakness (generalized): Secondary | ICD-10-CM | POA: Diagnosis not present

## 2018-01-20 DIAGNOSIS — M6281 Muscle weakness (generalized): Secondary | ICD-10-CM | POA: Diagnosis not present

## 2018-01-20 DIAGNOSIS — M5416 Radiculopathy, lumbar region: Secondary | ICD-10-CM | POA: Diagnosis not present

## 2018-01-23 DIAGNOSIS — M5416 Radiculopathy, lumbar region: Secondary | ICD-10-CM | POA: Diagnosis not present

## 2018-01-23 DIAGNOSIS — M6281 Muscle weakness (generalized): Secondary | ICD-10-CM | POA: Diagnosis not present

## 2018-01-27 DIAGNOSIS — M6281 Muscle weakness (generalized): Secondary | ICD-10-CM | POA: Diagnosis not present

## 2018-01-27 DIAGNOSIS — M5416 Radiculopathy, lumbar region: Secondary | ICD-10-CM | POA: Diagnosis not present

## 2018-02-08 ENCOUNTER — Other Ambulatory Visit: Payer: Self-pay | Admitting: Medical

## 2018-02-08 DIAGNOSIS — M6281 Muscle weakness (generalized): Secondary | ICD-10-CM | POA: Diagnosis not present

## 2018-02-08 DIAGNOSIS — M5416 Radiculopathy, lumbar region: Secondary | ICD-10-CM | POA: Diagnosis not present

## 2018-02-09 ENCOUNTER — Encounter (HOSPITAL_COMMUNITY): Payer: Self-pay | Admitting: *Deleted

## 2018-02-09 ENCOUNTER — Emergency Department (HOSPITAL_COMMUNITY): Payer: BLUE CROSS/BLUE SHIELD

## 2018-02-09 ENCOUNTER — Emergency Department (HOSPITAL_COMMUNITY)
Admission: EM | Admit: 2018-02-09 | Discharge: 2018-02-09 | Disposition: A | Discharge status: Home / Self Care | Payer: BLUE CROSS/BLUE SHIELD | Attending: Emergency Medicine | Admitting: Emergency Medicine

## 2018-02-09 DIAGNOSIS — N200 Calculus of kidney: Secondary | ICD-10-CM

## 2018-02-09 DIAGNOSIS — N132 Hydronephrosis with renal and ureteral calculous obstruction: Secondary | ICD-10-CM | POA: Diagnosis not present

## 2018-02-09 DIAGNOSIS — I129 Hypertensive chronic kidney disease with stage 1 through stage 4 chronic kidney disease, or unspecified chronic kidney disease: Secondary | ICD-10-CM | POA: Insufficient documentation

## 2018-02-09 DIAGNOSIS — Z87891 Personal history of nicotine dependence: Secondary | ICD-10-CM | POA: Diagnosis not present

## 2018-02-09 DIAGNOSIS — N189 Chronic kidney disease, unspecified: Secondary | ICD-10-CM | POA: Insufficient documentation

## 2018-02-09 DIAGNOSIS — Z79899 Other long term (current) drug therapy: Secondary | ICD-10-CM | POA: Insufficient documentation

## 2018-02-09 DIAGNOSIS — R1031 Right lower quadrant pain: Secondary | ICD-10-CM | POA: Diagnosis not present

## 2018-02-09 LAB — URINALYSIS, ROUTINE W REFLEX MICROSCOPIC
Bilirubin Urine: NEGATIVE
GLUCOSE, UA: NEGATIVE mg/dL
Hgb urine dipstick: NEGATIVE
KETONES UR: NEGATIVE mg/dL
Leukocytes, UA: NEGATIVE
Nitrite: NEGATIVE
PH: 7 (ref 5.0–8.0)
Protein, ur: NEGATIVE mg/dL
Specific Gravity, Urine: 1.018 (ref 1.005–1.030)

## 2018-02-09 LAB — I-STAT CHEM 8, ED
BUN: 19 mg/dL (ref 6–20)
CALCIUM ION: 1.19 mmol/L (ref 1.15–1.40)
CHLORIDE: 105 mmol/L (ref 98–111)
CREATININE: 1.4 mg/dL — AB (ref 0.61–1.24)
GLUCOSE: 86 mg/dL (ref 70–99)
HCT: 44 % (ref 39.0–52.0)
Hemoglobin: 15 g/dL (ref 13.0–17.0)
POTASSIUM: 4 mmol/L (ref 3.5–5.1)
Sodium: 139 mmol/L (ref 135–145)
TCO2: 28 mmol/L (ref 22–32)

## 2018-02-09 LAB — CBC WITH DIFFERENTIAL/PLATELET
ABS IMMATURE GRANULOCYTES: 0.01 10*3/uL (ref 0.00–0.07)
BASOS PCT: 1 %
Basophils Absolute: 0 10*3/uL (ref 0.0–0.1)
EOS ABS: 0.1 10*3/uL (ref 0.0–0.5)
Eosinophils Relative: 2 %
HEMATOCRIT: 43.3 % (ref 39.0–52.0)
Hemoglobin: 14.8 g/dL (ref 13.0–17.0)
IMMATURE GRANULOCYTES: 0 %
LYMPHS ABS: 1.3 10*3/uL (ref 0.7–4.0)
Lymphocytes Relative: 33 %
MCH: 30.6 pg (ref 26.0–34.0)
MCHC: 34.2 g/dL (ref 30.0–36.0)
MCV: 89.5 fL (ref 80.0–100.0)
MONOS PCT: 7 %
Monocytes Absolute: 0.3 10*3/uL (ref 0.1–1.0)
NEUTROS ABS: 2.3 10*3/uL (ref 1.7–7.7)
NEUTROS PCT: 57 %
Platelets: 257 10*3/uL (ref 150–400)
RBC: 4.84 MIL/uL (ref 4.22–5.81)
RDW: 12.3 % (ref 11.5–15.5)
WBC: 4 10*3/uL (ref 4.0–10.5)
nRBC: 0 % (ref 0.0–0.2)

## 2018-02-09 MED ORDER — HYDROCODONE-ACETAMINOPHEN 5-325 MG PO TABS
1.0000 | ORAL_TABLET | Freq: Once | ORAL | Status: AC
  Administered 2018-02-09: 1 via ORAL
  Filled 2018-02-09: qty 1

## 2018-02-09 MED ORDER — ONDANSETRON HCL 4 MG/2ML IJ SOLN
4.0000 mg | Freq: Once | INTRAMUSCULAR | Status: AC
  Administered 2018-02-09: 4 mg via INTRAVENOUS
  Filled 2018-02-09: qty 2

## 2018-02-09 MED ORDER — HYDROCODONE-ACETAMINOPHEN 5-325 MG PO TABS: 1.0000 | ORAL_TABLET | ORAL | 0 refills | Status: DC | PRN

## 2018-02-09 MED ORDER — HYDROMORPHONE HCL 1 MG/ML IJ SOLN
1.0000 mg | Freq: Once | INTRAMUSCULAR | Status: AC
  Administered 2018-02-09: 1 mg via INTRAVENOUS
  Filled 2018-02-09: qty 1

## 2018-02-09 NOTE — ED Triage Notes (Signed)
Pt complains of left sided groin pain for the past few days that became worse today. Pt has appointment with [facility] today but could not wait d/t pain. Pt has had difficulty urinating.

## 2018-02-09 NOTE — ED Provider Notes (Signed)
[facility]-EMERGENCY DEPT Provider Note   CSN: [id number] Arrival date & time: 02/09/18  7829     History   Chief Complaint Chief Complaint  Patient presents with  . Groin Pain    HPI [name] is a 42 y.o. male.  The history is provided by the patient.  Groin Pain  This is a recurrent problem. Episode onset: intermittantly since sunday. The problem occurs hourly. The problem has been rapidly worsening. Associated symptoms comments: Pain in the right abd and radiates into the groin with some pressure with urinating.  No n/v or diarrhea.  No fever.  Hx of HTN but has not taking meds today.  Prior hx of kidney stones that always have to be surgically removed.  Last removed about 1 year ago by Dr. Berneice Heinrich. Nothing aggravates the symptoms. Nothing relieves the symptoms. He has tried nothing for the symptoms. The treatment provided no relief.    Past Medical History:  Diagnosis Date  . Asthma    last asthma attack last year in 2012  . Hearing impairment    wears hearing aids  . History of kidney stones   . Hypertension    off bp meds  last 2 years   . Renal stone   . Sarcoidosis   . Wears glasses     Patient Active Problem List   Diagnosis Date Noted  . Heartburn 11/17/2017  . Screening for lipid disorders 11/17/2017  . Influenza vaccination declined 11/17/2017  . Paresthesia 09/30/2017  . Noncompliance 09/30/2017  . Essential hypertension, benign 04/07/2017  . Proteinuria 04/07/2017  . Chronic kidney disease 04/07/2017  . Cervical radiculopathy 04/07/2017  . Headache syndrome 04/07/2017  . Moderate persistent asthma without complication 03/22/2017  . History of sarcoidosis 03/22/2017  . Frequent headaches 03/22/2017  . Nasal sinus congestion 03/22/2017  . Elevated blood pressure reading 05/13/2016  . Staghorn kidney stones 01/30/2015  . Nephrolithiasis   . Right ureteral stone 01/20/2015  . Hydronephrosis of right kidney 01/20/2015  .  Hydronephrosis, right 01/20/2015  . Sarcoid 10/19/2010    Past Surgical History:  Procedure Laterality Date  . CYSTOSCOPY W/ RETROGRADES Right 08/20/2015   Procedure: CYSTOSCOPY WITH RETROGRADE PYELOGRAM;  Surgeon: Sebastian Ache, MD;  Location: [facility];  Service: Urology;  Laterality: Right;  . CYSTOSCOPY WITH URETEROSCOPY AND STENT PLACEMENT Right 08/20/2015   Procedure: CYSTOSCOPY WITH URETEROSCOPY AND STENT PLACEMENT;  Surgeon: Sebastian Ache, MD;  Location: [facility];  Service: Urology;  Laterality: Right;  . CYSTOSCOPY/RETROGRADE/URETEROSCOPY  01/12/2012   Procedure: CYSTOSCOPY/RETROGRADE/URETEROSCOPY;  Surgeon: Sebastian Ache, MD;  Location: [facility];  Service: Urology;  Laterality: Right;  . CYSTOSCOPY/URETEROSCOPY/HOLMIUM LASER/STENT PLACEMENT Left 06/16/2016   Procedure: STAGE ONE-CYSTOSCOPY/DIAGNOSTIC URETEROSCOPY/RETROGRADE/STENT PLACEMENT;  Surgeon: Sebastian Ache, MD;  Location: [facility];  Service: Urology;  Laterality: Left;  . CYSTOSCOPY/URETEROSCOPY/HOLMIUM LASER/STENT PLACEMENT Left 06/30/2016   Procedure: STAGE TWO-CYSTOSCOPY/URETEROSCOPY/HOLMIUM LASER/STENT EXCHANGE;  Surgeon: Sebastian Ache, MD;  Location: [facility];  Service: Urology;  Laterality: Left;  . ESOPHAGEAL DILATION    . HOLMIUM LASER APPLICATION Right 08/20/2015   Procedure: HOLMIUM LASER APPLICATION;  Surgeon: Sebastian Ache, MD;  Location: [facility];  Service: Urology;  Laterality: Right;  . NEPHROLITHOTOMY  02/11/2012   Procedure: NEPHROLITHOTOMY PERCUTANEOUS;  Surgeon: Sebastian Ache, MD;  Location: [facility];  Service: Urology;  Laterality: Right;  . NEPHROLITHOTOMY Right 01/30/2015   Procedure: NEPHROLITHOTOMY PERCUTANEOUS, NEPHROSTOGRAM;  Surgeon: Sebastian Ache, MD;  Location: [facility];  Service: Urology;  Laterality: Right;  . STONE EXTRACTION WITH BASKET Right 08/20/2015   Procedure: STONE EXTRACTION WITH BASKET;  Surgeon: Sebastian Ache, MD;   Location: [facility];  Service: Urology;  Laterality: Right;  . URETEROSCOPY  01/30/2015   Procedure: URETEROSCOPY WITH BASKETING;  Surgeon: Sebastian Ache, MD;  Location: [facility];  Service: Urology;;        Home Medications    Prior to Admission medications   Medication Sig Start Date End Date Taking? Authorizing Provider  albuterol (PROVENTIL HFA;VENTOLIN HFA) 108 (90 Base) MCG/ACT inhaler Inhale 2 puffs into the lungs every 6 (six) hours as needed for wheezing or shortness of breath. 04/07/17   Tysinger, Kermit Balo, PA-C  budesonide-formoterol (SYMBICORT) 160-4.5 MCG/ACT inhaler Inhale 2 puffs into the lungs 2 (two) times daily. 03/22/17   Tysinger, Kermit Balo, PA-C  diazepam (VALIUM) 5 MG tablet TAKE 1 - 2 TABLETS BY MOUTH 1 HOUR PRIOR TO PROCEDURE 10/28/17   [provider]  triamcinolone cream (KENALOG) 0.1 % APPLY TO AFFECTED AREA TWICE A DAY 02/08/18   Tysinger, Kermit Balo, PA-C  valsartan (DIOVAN) 40 MG tablet TAKE 1 TABLET BY MOUTH EVERY DAY 01/02/18   Tysinger, Kermit Balo, PA-C    Family History Family History  Problem Relation Age of Onset  . Hypertension Mother   . Asthma Mother   . Diabetes Father   . Heart attack Father   . Liver disease Sister   . Cancer Paternal Grandmother     Social History Social History   Tobacco Use  . Smoking status: Former Smoker    Packs/day: 0.20    Years: 2.00    Pack years: 0.40    Types: Cigarettes    Last attempt to quit: 04/06/2007    Years since quitting: 10.8  . Smokeless tobacco: Former Neurosurgeon    Types: Snuff    Quit date: 04/05/2013  Substance Use Topics  . Alcohol use: No  . Drug use: No     Allergies   Patient has no known allergies.   Review of Systems Review of Systems  All other systems reviewed and are negative.    Physical Exam Updated Vital Signs BP (!) 177/124 (BP Location: Right Arm)   Pulse 69   Temp 97.6 F (36.4 C) (Oral)   Resp 18   SpO2 100%   Physical Exam  Constitutional: He is  oriented to person, place, and time. He appears well-developed and well-nourished. No distress.  HENT:  Head: Normocephalic and atraumatic.  Mouth/Throat: Oropharynx is clear and moist.  Eyes: Pupils are equal, round, and reactive to light. Conjunctivae and EOM are normal.  Neck: Normal range of motion. Neck supple.  Cardiovascular: Normal rate, regular rhythm and intact distal pulses.  No murmur heard. Pulmonary/Chest: Effort normal and breath sounds normal. No respiratory distress. He has no wheezes. He has no rales.  Abdominal: Soft. He exhibits no distension. There is tenderness in the right lower quadrant and suprapubic area. There is no rebound, no guarding and no CVA tenderness.    Musculoskeletal: Normal range of motion. He exhibits no edema or tenderness.  Neurological: He is alert and oriented to person, place, and time.  Skin: Skin is warm and dry. No rash noted. No erythema.  Psychiatric: He has a normal mood and affect. His behavior is normal.  Nursing note and vitals reviewed.    ED Treatments / Results  Labs (all labs ordered are listed, but only abnormal results are displayed) Labs Reviewed  I-STAT  CHEM 8, ED - Abnormal; Notable for the following components:      Result Value   Creatinine, Ser 1.40 (*)    All other components within normal limits  CBC WITH DIFFERENTIAL/PLATELET  URINALYSIS, ROUTINE W REFLEX MICROSCOPIC    EKG None  Radiology Ct Renal Stone Study  Result Date: 02/09/2018 CLINICAL DATA:  Question kidney stone. LEFT-sided groin pain for the past few days, worse today. Difficulty urinating. EXAM: CT ABDOMEN AND PELVIS WITHOUT CONTRAST TECHNIQUE: Multidetector CT imaging of the abdomen and pelvis was performed following the standard protocol without IV contrast. COMPARISON:  04/29/2016 and multiple previous exams FINDINGS: Lower chest: Lung bases are clear. Evaluation the lung bases is degraded by patient motion artifact Heart size is normal. No  pericardial effusion or significant coronary artery calcifications.No focal liver abnormality is seen. No radiopaque gallstones, biliary dilatation, or pericholecystic inflammatory changes. Hepatobiliary: No focal liver abnormality is seen. No gallstones, gallbladder wall thickening, or biliary dilatation. Pancreas: Unremarkable. No pancreatic ductal dilatation or surrounding inflammatory changes. Spleen: Normal in size without focal abnormality. Adrenals/Urinary Tract: Adrenal glands are normal in appearance. There are punctate calcifications within the kidneys bilaterally, largest in the LOWER pole of the RIGHT kidney measuring 3 millimeters. Calculus seen in the LEFT midpole region is 2 millimeters. There are bilateral renal cysts. There is mild dilatation of the LEFT renal pelvis and proximal ureter secondary to an obstructing stone in the distal ureter, approximately 2.5 centimeters from the ureterovesical junction. The RIGHT ureter is unremarkable. The bladder and visualized portion of the urethra are normal. Stomach/Bowel: The stomach and small bowel loops are normal in appearance. There are scattered colonic diverticula. No acute diverticulitis. The appendix is well seen and has a normal appearance. Vascular/Lymphatic: Small mesenteric lymph nodes measure up to 9 millimeters. Stable gastrohepatic lymph node measuring 14 millimeters. There is a persistent hazy appearance of the mesenteric root, unchanged compared to prior studies. Reproductive: Prostate is unremarkable. Other: No abdominal wall hernia or abnormality. No abdominopelvic ascites. Musculoskeletal: There are degenerative changes at L5-S1. IMPRESSION: 1. LEFT hydronephrosis and hydroureter secondary to a 4 millimeter calculus in the distal ureter, 2.5 centimeters from the ureterovesical junction. 2. Bilateral renal cysts. 3. Bilateral intrarenal calculi. 4. Stable appearance of mesenteric root stranding, consistent with benign inflammatory changes.  Electronically Signed   By: Norva Pavlov M.D.   On: 02/09/2018 12:15    Procedures Procedures (including critical care time)  Medications Ordered in ED Medications  HYDROmorphone (DILAUDID) injection 1 mg (has no administration in time range)  ondansetron (ZOFRAN) injection 4 mg (has no administration in time range)     Initial Impression / Assessment and Plan / ED Course  I have reviewed the triage vital signs and the nursing notes.  Pertinent labs & imaging results that were available during my care of the patient were reviewed by me and considered in my medical decision making (see chart for details).     Pt with symptoms consistent with kidney stone with hx of same.  Denies infectious sx, or GI symptoms.  Low concern for diverticulitis and  AAA.  No hx suggestive of GU source (discharge).  Will treat pain and ensure no infection with UA, CBC, BMP and will get stone study to further eval.  12:33 PM Labs are reassuring.  CT consistent with a 4mm stone in the left ureter.  Since pain is controlled after 1 dose of IV medication.  Findings discussed with the patient and his wife.  He will  follow-up with urology.   Final Clinical Impressions(s) / ED Diagnoses   Final diagnoses:  Kidney stone    ED Discharge Orders         Ordered    HYDROcodone-acetaminophen (NORCO/VICODIN) 5-325 MG tablet  Every 4 hours PRN     02/09/18 1235           Gwyneth Sprout, MD 02/09/18 1236

## 2018-02-09 NOTE — Discharge Instructions (Signed)
Make sure you are drinking plenty of fluids.  You can take the pain medication as needed but you could also take Tylenol or ibuprofen first if you do not want to take a strong painkiller.

## 2018-02-10 ENCOUNTER — Ambulatory Visit: Payer: BLUE CROSS/BLUE SHIELD | Admitting: Medical

## 2018-02-10 DIAGNOSIS — M6281 Muscle weakness (generalized): Secondary | ICD-10-CM | POA: Diagnosis not present

## 2018-02-10 DIAGNOSIS — M5416 Radiculopathy, lumbar region: Secondary | ICD-10-CM | POA: Diagnosis not present

## 2018-02-16 DIAGNOSIS — N132 Hydronephrosis with renal and ureteral calculous obstruction: Secondary | ICD-10-CM | POA: Diagnosis not present

## 2018-03-21 DIAGNOSIS — M5126 Other intervertebral disc displacement, lumbar region: Secondary | ICD-10-CM | POA: Diagnosis not present

## 2018-03-21 DIAGNOSIS — M25512 Pain in left shoulder: Secondary | ICD-10-CM | POA: Diagnosis not present

## 2018-03-27 ENCOUNTER — Other Ambulatory Visit: Payer: Self-pay | Admitting: Neurosurgery

## 2018-03-27 DIAGNOSIS — M25512 Pain in left shoulder: Secondary | ICD-10-CM

## 2018-04-03 ENCOUNTER — Ambulatory Visit
Admission: RE | Admit: 2018-04-03 | Discharge: 2018-04-03 | Disposition: A | Discharge status: Home / Self Care | Payer: BLUE CROSS/BLUE SHIELD | Source: Ambulatory Visit | Attending: Neurosurgery | Admitting: Neurosurgery

## 2018-04-03 DIAGNOSIS — M25512 Pain in left shoulder: Secondary | ICD-10-CM

## 2018-04-14 DIAGNOSIS — M5126 Other intervertebral disc displacement, lumbar region: Secondary | ICD-10-CM | POA: Diagnosis not present

## 2018-04-14 DIAGNOSIS — M5440 Lumbago with sciatica, unspecified side: Secondary | ICD-10-CM | POA: Diagnosis not present

## 2018-04-18 DIAGNOSIS — M5126 Other intervertebral disc displacement, lumbar region: Secondary | ICD-10-CM | POA: Diagnosis not present

## 2018-05-01 DIAGNOSIS — M4727 Other spondylosis with radiculopathy, lumbosacral region: Secondary | ICD-10-CM | POA: Diagnosis not present

## 2018-05-01 DIAGNOSIS — M5127 Other intervertebral disc displacement, lumbosacral region: Secondary | ICD-10-CM | POA: Diagnosis not present

## 2018-05-01 DIAGNOSIS — M5117 Intervertebral disc disorders with radiculopathy, lumbosacral region: Secondary | ICD-10-CM | POA: Diagnosis not present

## 2018-05-05 DIAGNOSIS — N202 Calculus of kidney with calculus of ureter: Secondary | ICD-10-CM | POA: Diagnosis not present

## 2018-05-05 DIAGNOSIS — N200 Calculus of kidney: Secondary | ICD-10-CM | POA: Diagnosis not present

## 2018-05-05 DIAGNOSIS — R338 Other retention of urine: Secondary | ICD-10-CM | POA: Diagnosis not present

## 2018-06-08 DIAGNOSIS — R338 Other retention of urine: Secondary | ICD-10-CM | POA: Diagnosis not present

## 2018-06-08 DIAGNOSIS — N202 Calculus of kidney with calculus of ureter: Secondary | ICD-10-CM | POA: Diagnosis not present
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
## 2018-06-28 ENCOUNTER — Other Ambulatory Visit: Payer: Self-pay | Admitting: Medical

## 2018-06-28 ENCOUNTER — Telehealth: Payer: Self-pay | Admitting: Medical

## 2018-06-28 MED ORDER — VALSARTAN 40 MG PO TABS: 40.0000 mg | ORAL_TABLET | Freq: Every day | ORAL | 2 refills | Status: DC

## 2018-06-28 MED ORDER — ALBUTEROL SULFATE HFA 108 (90 BASE) MCG/ACT IN AERS: 2.0000 | INHALATION_SPRAY | Freq: Four times a day (QID) | RESPIRATORY_TRACT | 0 refills | Status: DC | PRN

## 2018-06-28 NOTE — Telephone Encounter (Signed)
meds sent.     Please set them up a virtual encounter for tomorrow or next week as we haven't discussed asthma in a while.   Reason for visit: Asthma, HTN   Please walk them through downloading [employer] app on their smart phone so we can do a virtual face to face visit.  Once they have the app downloaded, let them know that you will coordinate with me to send them a webex request via e-mail to start our virtual visit.  If they can't do the virtual visit for whatever reason, let them know I will call them close to the appointment time we have listed.    Verify allergies and medications.  Get a description of their symptoms.  FYI Please get them to check their BP if possible if you think it will be needed for the consult.

## 2018-06-28 NOTE — Telephone Encounter (Signed)
Pt called for refills of albuterol and valsartan. Please send to [facility] in [place]. Pt can be reached at [phone].

## 2018-06-29 NOTE — Telephone Encounter (Signed)
Called pt and virtual visit set up

## 2018-07-04 ENCOUNTER — Encounter: Payer: Self-pay | Admitting: Medical

## 2018-07-04 ENCOUNTER — Ambulatory Visit (INDEPENDENT_AMBULATORY_CARE_PROVIDER_SITE_OTHER): Payer: BLUE CROSS/BLUE SHIELD | Admitting: Medical

## 2018-07-04 ENCOUNTER — Other Ambulatory Visit: Payer: Self-pay

## 2018-07-04 VITALS — BP 184/98 | Ht 73.0 in | Wt 189.0 lb

## 2018-07-04 DIAGNOSIS — N189 Chronic kidney disease, unspecified: Secondary | ICD-10-CM | POA: Diagnosis not present

## 2018-07-04 DIAGNOSIS — J4540 Moderate persistent asthma, uncomplicated: Secondary | ICD-10-CM | POA: Diagnosis not present

## 2018-07-04 DIAGNOSIS — J301 Allergic rhinitis due to pollen: Secondary | ICD-10-CM

## 2018-07-04 DIAGNOSIS — I10 Essential (primary) hypertension: Secondary | ICD-10-CM

## 2018-07-04 DIAGNOSIS — Z862 Personal history of diseases of the blood and blood-forming organs and certain disorders involving the immune mechanism: Secondary | ICD-10-CM

## 2018-07-04 DIAGNOSIS — N200 Calculus of kidney: Secondary | ICD-10-CM

## 2018-07-04 MED ORDER — VALSARTAN 80 MG PO TABS: 80.0000 mg | ORAL_TABLET | Freq: Every day | ORAL | 2 refills | Status: DC

## 2018-07-04 MED ORDER — LEVOCETIRIZINE DIHYDROCHLORIDE 5 MG PO TABS: 5.0000 mg | ORAL_TABLET | Freq: Every evening | ORAL | 1 refills | Status: DC

## 2018-07-04 MED ORDER — BUDESONIDE-FORMOTEROL FUMARATE 160-4.5 MCG/ACT IN AERO: 2.0000 | INHALATION_SPRAY | Freq: Two times a day (BID) | RESPIRATORY_TRACT | 5 refills | Status: DC

## 2018-07-04 NOTE — Progress Notes (Signed)
Subjective:    Patient ID: [name], male    DOB: December 14, 1975, 43 y.o.   MRN: [id number] Documentation for virtual audio and video telecommunications through WebEx encounter:  The patient was located at home. The provider was located in the office. The patient did consent to this visit and is aware of possible charges through their insurance for this visit.  The other persons participating in this telemedicine service were none. Time spent on call was 25 minutes and in review of previous records >30 minutes total.  This virtual service is not related to other E/M service within previous 7 days.   HPI Chief Complaint  Patient presents with  . med check    med check,  cough, weakness, SOB, muscle pains, headache X 2 weeks    Medical team:  [name], phsyical medicine  [name], orthopedics  [name], urology  [name], Christian Balo, PA-C here for primary care   [name] has a history of high blood pressure, chronic kidney disease, history of sarcoidosis, asthma, history of proteinuria, recurrent kidney stones and recurrent pyelonephritis, and headaches.  Today's virtual visit is for chronic issues and for new symptoms.  HTN - taking Valsartan 40mg  daily.   BP today 141/88, and blood pressures typically running greater than 140 systolic.  Not having any issues with valsartan.  He sees urology, recently put on Indapamide for kidney stones.    Asthma - using albuterol some.  Has started back using it a little.   Not currently using Symbicort or antihistamine.     He notes some acute symptoms of cough for 2 weeks.  Has had some SOB x 1 week.  No fever. No body aches ,no chills, no fatigue.   Has some headaches.  No sick contacts.  Doesn't feel sick necessarily, more of pollen reaction.   Had back surgery in 04/2018.  Currently not due to go back to work until 07/19/18   Past Medical History:  Diagnosis Date  . Asthma    last asthma attack last  year in 2012  . Hearing impairment    wears hearing aids  . History of kidney stones   . Hypertension    off bp meds  last 2 years   . Renal stone   . Sarcoidosis   . Wears glasses    Current Outpatient Medications on File Prior to Visit  Medication Sig Dispense Refill  . albuterol (PROVENTIL HFA;VENTOLIN HFA) 108 (90 Base) MCG/ACT inhaler Inhale 2 puffs into the lungs every 6 (six) hours as needed for wheezing or shortness of breath. 1 Inhaler 0  . valsartan (DIOVAN) 40 MG tablet Take 1 tablet (40 mg total) by mouth daily. 90 tablet 2  . HYDROcodone-acetaminophen (NORCO/VICODIN) 5-325 MG tablet Take 1-2 tablets by mouth every 4 (four) hours as needed for severe pain. (Patient not taking: Reported on 07/04/2018) 10 tablet 0  . nabumetone (RELAFEN) 750 MG tablet Take 750 mg by mouth 2 (two) times daily as needed for mild pain.   0  . triamcinolone cream (KENALOG) 0.1 % APPLY TO AFFECTED AREA TWICE A DAY (Patient not taking: Reported on 02/09/2018) 30 g 0   No current facility-administered medications on file prior to visit.    Review of Systems As in subjective    Objective:   Physical Exam  Due to coronavirus pandemic stay at home measures, patient visit was virtual and they were not examined in person.   No distress, well  appearing      Assessment & Plan:   Encounter Diagnoses  Name Primary?  . Essential hypertension, benign Yes  . Moderate persistent asthma without complication   . Chronic kidney disease, unspecified CKD stage   . Nephrolithiasis   . History of sarcoidosis   . Allergic rhinitis due to pollen, unspecified seasonality    High blood pressure  Your blood pressure readings are still too high in  I would like to increase your Valsartan to 80 mg daily.  You can take 2 of the 40 mg tablets you have left until you run out, then change to the new dose sent to the pharmacy today  Continue to check your blood pressures periodically.  The goal is to have 130/80 or  less  Limit salt in your diet  Eat a healthy low-fat diet  Asthma and allergies  Restart Symbicort preventative inhaler 2 puffs twice daily for the next 1 to 2 months during allergy season  If this is too expensive let me know right away  Continue albuterol rescue inhaler 2 puffs every 4-6 hours as needed for cough, wheezing or shortness of breath  Call before you run completely out of your albuterol  Begin allergy pill Xyzal daily at bedtime for the next 2 months  Kidney stones  Continue your medication routine follow-up with urologist, [name]  History of sarcoidosis  I do not believe you have been to see your lung doctor in a while.    Given the history of sarcoidosis and given the findings on the last chest x-ray you had, I recommend doing a follow-up with a pulmonologist sometime in the summer once the coronavirus scare has calmed down   [name] was seen today for med check.  Diagnoses and all orders for this visit:  Essential hypertension, benign  Moderate persistent asthma without complication  Chronic kidney disease, unspecified CKD stage  Nephrolithiasis  History of sarcoidosis  Allergic rhinitis due to pollen, unspecified seasonality  Other orders -     valsartan (DIOVAN) 80 MG tablet; Take 1 tablet (80 mg total) by mouth daily. -     budesonide-formoterol (SYMBICORT) 160-4.5 MCG/ACT inhaler; Inhale 2 puffs into the lungs 2 (two) times daily. -     levocetirizine (XYZAL) 5 MG tablet; Take 1 tablet (5 mg total) by mouth every evening.   F/u 78mo

## 2018-07-05 DIAGNOSIS — R293 Abnormal posture: Secondary | ICD-10-CM | POA: Diagnosis not present

## 2018-07-05 DIAGNOSIS — M5126 Other intervertebral disc displacement, lumbar region: Secondary | ICD-10-CM | POA: Diagnosis not present

## 2018-07-05 DIAGNOSIS — M5416 Radiculopathy, lumbar region: Secondary | ICD-10-CM | POA: Diagnosis not present

## 2018-07-05 DIAGNOSIS — M6281 Muscle weakness (generalized): Secondary | ICD-10-CM | POA: Diagnosis not present

## 2018-07-07 DIAGNOSIS — M5416 Radiculopathy, lumbar region: Secondary | ICD-10-CM | POA: Diagnosis not present

## 2018-07-07 DIAGNOSIS — R293 Abnormal posture: Secondary | ICD-10-CM | POA: Diagnosis not present

## 2018-07-07 DIAGNOSIS — M5126 Other intervertebral disc displacement, lumbar region: Secondary | ICD-10-CM | POA: Diagnosis not present

## 2018-07-07 DIAGNOSIS — M6281 Muscle weakness (generalized): Secondary | ICD-10-CM | POA: Diagnosis not present

## 2018-07-10 DIAGNOSIS — M5126 Other intervertebral disc displacement, lumbar region: Secondary | ICD-10-CM | POA: Diagnosis not present

## 2018-07-10 DIAGNOSIS — M5416 Radiculopathy, lumbar region: Secondary | ICD-10-CM | POA: Diagnosis not present

## 2018-07-10 DIAGNOSIS — M6281 Muscle weakness (generalized): Secondary | ICD-10-CM | POA: Diagnosis not present

## 2018-07-10 DIAGNOSIS — R293 Abnormal posture: Secondary | ICD-10-CM | POA: Diagnosis not present

## 2018-07-12 DIAGNOSIS — M5126 Other intervertebral disc displacement, lumbar region: Secondary | ICD-10-CM | POA: Diagnosis not present

## 2018-07-12 DIAGNOSIS — R293 Abnormal posture: Secondary | ICD-10-CM | POA: Diagnosis not present

## 2018-07-12 DIAGNOSIS — M5416 Radiculopathy, lumbar region: Secondary | ICD-10-CM | POA: Diagnosis not present

## 2018-07-12 DIAGNOSIS — M6281 Muscle weakness (generalized): Secondary | ICD-10-CM | POA: Diagnosis not present

## 2018-07-17 ENCOUNTER — Other Ambulatory Visit: Payer: Self-pay | Admitting: Medical

## 2018-07-17 DIAGNOSIS — M5416 Radiculopathy, lumbar region: Secondary | ICD-10-CM | POA: Diagnosis not present

## 2018-07-17 DIAGNOSIS — M5126 Other intervertebral disc displacement, lumbar region: Secondary | ICD-10-CM | POA: Diagnosis not present

## 2018-07-17 DIAGNOSIS — R293 Abnormal posture: Secondary | ICD-10-CM | POA: Diagnosis not present

## 2018-07-17 DIAGNOSIS — M6281 Muscle weakness (generalized): Secondary | ICD-10-CM | POA: Diagnosis not present

## 2018-07-20 DIAGNOSIS — R293 Abnormal posture: Secondary | ICD-10-CM | POA: Diagnosis not present

## 2018-07-20 DIAGNOSIS — M5126 Other intervertebral disc displacement, lumbar region: Secondary | ICD-10-CM | POA: Diagnosis not present

## 2018-07-20 DIAGNOSIS — M6281 Muscle weakness (generalized): Secondary | ICD-10-CM | POA: Diagnosis not present

## 2018-07-20 DIAGNOSIS — M5416 Radiculopathy, lumbar region: Secondary | ICD-10-CM | POA: Diagnosis not present

## 2018-07-24 DIAGNOSIS — M6281 Muscle weakness (generalized): Secondary | ICD-10-CM | POA: Diagnosis not present

## 2018-07-24 DIAGNOSIS — M5416 Radiculopathy, lumbar region: Secondary | ICD-10-CM | POA: Diagnosis not present

## 2018-07-24 DIAGNOSIS — R293 Abnormal posture: Secondary | ICD-10-CM | POA: Diagnosis not present

## 2018-07-24 DIAGNOSIS — M5126 Other intervertebral disc displacement, lumbar region: Secondary | ICD-10-CM | POA: Diagnosis not present

## 2018-07-27 DIAGNOSIS — M6281 Muscle weakness (generalized): Secondary | ICD-10-CM | POA: Diagnosis not present

## 2018-07-27 DIAGNOSIS — M5416 Radiculopathy, lumbar region: Secondary | ICD-10-CM | POA: Diagnosis not present

## 2018-07-27 DIAGNOSIS — M5126 Other intervertebral disc displacement, lumbar region: Secondary | ICD-10-CM | POA: Diagnosis not present

## 2018-07-27 DIAGNOSIS — R293 Abnormal posture: Secondary | ICD-10-CM | POA: Diagnosis not present

## 2018-08-03 DIAGNOSIS — M6281 Muscle weakness (generalized): Secondary | ICD-10-CM | POA: Diagnosis not present

## 2018-08-03 DIAGNOSIS — M5126 Other intervertebral disc displacement, lumbar region: Secondary | ICD-10-CM | POA: Diagnosis not present

## 2018-08-03 DIAGNOSIS — R293 Abnormal posture: Secondary | ICD-10-CM | POA: Diagnosis not present

## 2018-08-03 DIAGNOSIS — M5416 Radiculopathy, lumbar region: Secondary | ICD-10-CM | POA: Diagnosis not present

## 2018-08-07 DIAGNOSIS — M5126 Other intervertebral disc displacement, lumbar region: Secondary | ICD-10-CM | POA: Diagnosis not present

## 2018-08-07 DIAGNOSIS — M6281 Muscle weakness (generalized): Secondary | ICD-10-CM | POA: Diagnosis not present

## 2018-08-07 DIAGNOSIS — R293 Abnormal posture: Secondary | ICD-10-CM | POA: Diagnosis not present

## 2018-08-07 DIAGNOSIS — M5416 Radiculopathy, lumbar region: Secondary | ICD-10-CM | POA: Diagnosis not present

## 2018-08-17 DIAGNOSIS — M5126 Other intervertebral disc displacement, lumbar region: Secondary | ICD-10-CM | POA: Diagnosis not present

## 2018-08-17 DIAGNOSIS — R293 Abnormal posture: Secondary | ICD-10-CM | POA: Diagnosis not present

## 2018-08-17 DIAGNOSIS — M6281 Muscle weakness (generalized): Secondary | ICD-10-CM | POA: Diagnosis not present

## 2018-08-17 DIAGNOSIS — M5416 Radiculopathy, lumbar region: Secondary | ICD-10-CM | POA: Diagnosis not present

## 2018-08-24 DIAGNOSIS — M6281 Muscle weakness (generalized): Secondary | ICD-10-CM | POA: Diagnosis not present

## 2018-08-24 DIAGNOSIS — M5126 Other intervertebral disc displacement, lumbar region: Secondary | ICD-10-CM | POA: Diagnosis not present

## 2018-08-24 DIAGNOSIS — R293 Abnormal posture: Secondary | ICD-10-CM | POA: Diagnosis not present

## 2018-08-24 DIAGNOSIS — M5416 Radiculopathy, lumbar region: Secondary | ICD-10-CM | POA: Diagnosis not present

## 2018-08-31 DIAGNOSIS — M5416 Radiculopathy, lumbar region: Secondary | ICD-10-CM | POA: Diagnosis not present

## 2018-08-31 DIAGNOSIS — M5126 Other intervertebral disc displacement, lumbar region: Secondary | ICD-10-CM | POA: Diagnosis not present

## 2018-08-31 DIAGNOSIS — R293 Abnormal posture: Secondary | ICD-10-CM | POA: Diagnosis not present

## 2018-08-31 DIAGNOSIS — M6281 Muscle weakness (generalized): Secondary | ICD-10-CM | POA: Diagnosis not present

## 2018-09-01 ENCOUNTER — Encounter: Payer: Self-pay | Admitting: Medical

## 2018-09-01 ENCOUNTER — Other Ambulatory Visit: Payer: Self-pay

## 2018-09-01 ENCOUNTER — Ambulatory Visit: Payer: BLUE CROSS/BLUE SHIELD | Admitting: Medical

## 2018-09-01 VITALS — BP 140/90 | HR 87 | Temp 98.2°F | Resp 16 | Ht 72.0 in | Wt 196.6 lb

## 2018-09-01 DIAGNOSIS — K921 Melena: Secondary | ICD-10-CM

## 2018-09-01 DIAGNOSIS — I159 Secondary hypertension, unspecified: Secondary | ICD-10-CM

## 2018-09-01 DIAGNOSIS — R208 Other disturbances of skin sensation: Secondary | ICD-10-CM

## 2018-09-01 DIAGNOSIS — N50819 Testicular pain, unspecified: Secondary | ICD-10-CM | POA: Diagnosis not present

## 2018-09-01 LAB — POCT URINALYSIS DIP (PROADVANTAGE DEVICE)
Bilirubin, UA: NEGATIVE
Blood, UA: NEGATIVE
Glucose, UA: NEGATIVE mg/dL
Ketones, POC UA: NEGATIVE mg/dL
Leukocytes, UA: NEGATIVE
Nitrite, UA: NEGATIVE
Protein Ur, POC: NEGATIVE mg/dL
Specific Gravity, Urine: 1.025
Urobilinogen, Ur: NEGATIVE
pH, UA: 6 (ref 5.0–8.0)

## 2018-09-01 NOTE — Progress Notes (Signed)
Subjective: Chief Complaint  Patient presents with  . rectal bleeding    rectal bleeding, private area being pulled X January after back surgery 05-01-18   Here for concerning symptoms that started after back surgery in February.  He has been having some blood in stool, rectal burning.  Started in February, has been intermittent.  Lately every time he has BM sees some blood.   Generally sees dark red blood on toilet paper.  Not typically blood in toilet bowel or mixed in with stool.   No pain with BMs.  Does feel hemorrhoids poking out.   sometimes gets itching at anus.     At times feels like squeezing sensation in testicles, burning sensation at time x last month.  No penile discharge.  No blood in semen.  No swelling.    Saw Dr. Manny/Urology for problems passing urine 05/2018, was not found to have stone but was put on antibiotic for UTI.   HTN - not checking BPs.   He quit taking Diovan 3 weeks ago, thought it could have something to do with the blood in stool.    Not taking any NSAIDs.   Not drinking alcohol of late.     Past Medical History:  Diagnosis Date  . Asthma    last asthma attack last year in 2012  . Hearing impairment    wears hearing aids  . History of kidney stones   . Hypertension    off bp meds  last 2 years   . Renal stone   . Sarcoidosis   . Wears glasses    Current Outpatient Medications on File Prior to Visit  Medication Sig Dispense Refill  . budesonide-formoterol (SYMBICORT) 160-4.5 MCG/ACT inhaler Inhale 2 puffs into the lungs 2 (two) times daily. 1 Inhaler 5  . valsartan (DIOVAN) 80 MG tablet Take 1 tablet (80 mg total) by mouth daily. 30 tablet 2  . VENTOLIN HFA 108 (90 Base) MCG/ACT inhaler TAKE 2 PUFFS BY MOUTH EVERY 6 HOURS AS NEEDED FOR WHEEZE OR SHORTNESS OF BREATH 18 Inhaler 0  . HYDROcodone-acetaminophen (NORCO/VICODIN) 5-325 MG tablet Take 1-2 tablets by mouth every 4 (four) hours as needed for severe pain. (Patient not taking: Reported on  07/04/2018) 10 tablet 0  . levocetirizine (XYZAL) 5 MG tablet Take 1 tablet (5 mg total) by mouth every evening. (Patient not taking: Reported on 09/01/2018) 90 tablet 1  . nabumetone (RELAFEN) 750 MG tablet Take 750 mg by mouth 2 (two) times daily as needed for mild pain.   0  . triamcinolone cream (KENALOG) 0.1 % APPLY TO AFFECTED AREA TWICE A DAY (Patient not taking: Reported on 02/09/2018) 30 g 0   No current facility-administered medications on file prior to visit.    ROS as in subjective    Objective: BP 140/90   Pulse 87   Temp 98.2 F (36.8 C) (Temporal)   Resp 16   Ht 6' (1.829 m)   Wt 196 lb 9.6 oz (89.2 kg)   SpO2 98%   BMI 26.66 kg/m    Wt Readings from Last 3 Encounters:  09/01/18 196 lb 9.6 oz (89.2 kg)  07/04/18 189 lb (85.7 kg)  11/17/17 190 lb 6.4 oz (86.4 kg)    BP Readings from Last 3 Encounters:  09/01/18 140/90  07/04/18 (!) 184/98  02/09/18 (!) 171/127   General appearance: alert, no distress, WD/WN,  Abdomen: +bs, soft, non tender, non distended, no masses, no hepatomegaly, no splenomegaly Pulses: 2+ symmetric, upper  and lower extremities, normal cap refill Ext:no edema GU: normal male, circumcised, no hernia, no lymphadenopathy, mild tenderness left testicle, but no mass Rectal: no obvious external abnormality, no obvious hemorrhoid or obvious bleeding    Assessment: Encounter Diagnoses  Name Primary?  . Blood in stool Yes  . Rectal burning   . Testicular discomfort   . Secondary hypertension      Plan: Blood in stool x months.   Labs today, but likely referral to GI to rule out worrisome causes  Testicular discomfort - labs today  HTN - restart Diovan, discussed risks/benefits  Rohin was seen today for rectal bleeding.  Diagnoses and all orders for this visit:  Blood in stool -     CBC  Rectal burning -     HIV Antibody (routine testing w rflx) -     RPR -     GC/Chlamydia Probe Amp -     CBC  Testicular discomfort -      HIV Antibody (routine testing w rflx) -     RPR -     GC/Chlamydia Probe Amp  Secondary hypertension

## 2018-09-01 NOTE — Addendum Note (Signed)
Addended by: Derinda Late on: 09/01/2018 09:51 AM   Modules accepted: Orders

## 2018-09-01 NOTE — Patient Instructions (Signed)
We will do some screening tests today given the symptoms  I will likely refer you to gastroenterology for further evaluation of blood in stool since its occurring so frequently  Other than left testicular discomfort, no other exam abnormalities today, no obvious hemorrhoid today  Blood in the stool can be caused by several reasons  Restart your blood pressure pill as it has nothing to do with rectal bleeding.      Rectal Bleeding  Rectal bleeding is when blood comes out of the opening of the butt (anus). People with this kind of bleeding may notice bright red blood in their underwear or in the toilet after they poop (have a bowel movement). They may also have dark red or black poop (stool). Rectal bleeding is often a sign that something is wrong. It needs to be checked by a doctor. Follow these instructions at home: Watch for any changes in your condition. Take these actions to help with bleeding and discomfort:  Eat a diet that is high in fiber. This will keep your poop soft so it is easier for you to poop without pushing too hard. Ask your doctor to tell you what foods and drinks are high in fiber.  Drink enough fluid to keep your pee (urine) clear or pale yellow. This also helps keep your poop soft.  Try taking a warm bath. This may help with pain.  Keep all follow-up visits as told by your doctor. This is important. Get help right away if:  You have new bleeding.  You have more bleeding than before.  You have black or dark red poop.  You throw up (vomit) blood or something that looks like coffee grounds.  You have pain or tenderness in your belly (abdomen).  You have a fever.  You feel weak.  You feel sick to your stomach (nauseous).  You pass out (faint).  You have very bad pain in your butt.  You cannot poop. This information is not intended to replace advice given to you by your health care provider. Make sure you discuss any questions you have with your  health care provider. Document Released: 12/02/2010 Document Revised: 08/28/2015 Document Reviewed: 05/18/2015 Elsevier Interactive Patient Education  [employer].

## 2018-09-02 LAB — RPR: RPR Ser Ql: NONREACTIVE

## 2018-09-02 LAB — CBC
Hematocrit: 43.9 % (ref 37.5–51.0)
Hemoglobin: 15.5 g/dL (ref 13.0–17.7)
MCH: 30.5 pg (ref 26.6–33.0)
MCHC: 35.3 g/dL (ref 31.5–35.7)
MCV: 86 fL (ref 79–97)
Platelets: 275 10*3/uL (ref 150–450)
RBC: 5.08 x10E6/uL (ref 4.14–5.80)
RDW: 13.1 % (ref 11.6–15.4)
WBC: 3.9 10*3/uL (ref 3.4–10.8)

## 2018-09-02 LAB — HIV ANTIBODY (ROUTINE TESTING W REFLEX): HIV Screen 4th Generation wRfx: NONREACTIVE

## 2018-09-04 ENCOUNTER — Other Ambulatory Visit: Payer: Self-pay | Admitting: Medical

## 2018-09-04 DIAGNOSIS — K921 Melena: Secondary | ICD-10-CM

## 2018-09-08 DIAGNOSIS — M6281 Muscle weakness (generalized): Secondary | ICD-10-CM | POA: Diagnosis not present

## 2018-09-08 DIAGNOSIS — R293 Abnormal posture: Secondary | ICD-10-CM | POA: Diagnosis not present

## 2018-09-08 DIAGNOSIS — M5416 Radiculopathy, lumbar region: Secondary | ICD-10-CM | POA: Diagnosis not present

## 2018-09-08 DIAGNOSIS — M5126 Other intervertebral disc displacement, lumbar region: Secondary | ICD-10-CM | POA: Diagnosis not present

## 2018-09-11 LAB — GC/CHLAMYDIA PROBE AMP
Chlamydia trachomatis, NAA: NEGATIVE
Neisseria Gonorrhoeae by PCR: NEGATIVE

## 2018-09-12 ENCOUNTER — Encounter: Payer: Self-pay | Admitting: Gastroenterology

## 2018-09-12 ENCOUNTER — Telehealth: Payer: Self-pay | Admitting: Physical Medicine and Rehabilitation

## 2018-09-12 NOTE — Telephone Encounter (Signed)
Lattie Haw with Automated Records Collection [phone] Calling reguesting Diagnostic films from 04/29/16-09/08/18  Please call to advise [facility].

## 2018-09-13 DIAGNOSIS — R293 Abnormal posture: Secondary | ICD-10-CM | POA: Diagnosis not present

## 2018-09-13 DIAGNOSIS — M5126 Other intervertebral disc displacement, lumbar region: Secondary | ICD-10-CM | POA: Diagnosis not present

## 2018-09-13 DIAGNOSIS — M5416 Radiculopathy, lumbar region: Secondary | ICD-10-CM | POA: Diagnosis not present

## 2018-09-13 DIAGNOSIS — M6281 Muscle weakness (generalized): Secondary | ICD-10-CM | POA: Diagnosis not present

## 2018-09-13 NOTE — Telephone Encounter (Signed)
I left message for Lattie Haw to return call.  I am only able to release cervical injection images, MRI's ordered by Dr. Erlinda Hong were done at [place] they would need to be contacted for copies, also there is several CT exams order by another physician that is within timeframe asked however they were done at [facility] and we are unable to make CD for these due to CD capacity.

## 2018-09-19 DIAGNOSIS — M542 Cervicalgia: Secondary | ICD-10-CM | POA: Diagnosis not present

## 2018-09-19 DIAGNOSIS — M5416 Radiculopathy, lumbar region: Secondary | ICD-10-CM | POA: Diagnosis not present

## 2018-09-19 DIAGNOSIS — Z6826 Body mass index (BMI) 26.0-26.9, adult: Secondary | ICD-10-CM | POA: Diagnosis not present

## 2018-09-19 DIAGNOSIS — I10 Essential (primary) hypertension: Secondary | ICD-10-CM | POA: Diagnosis not present

## 2018-09-20 ENCOUNTER — Other Ambulatory Visit: Payer: Self-pay | Admitting: Neurosurgery

## 2018-09-20 DIAGNOSIS — M6281 Muscle weakness (generalized): Secondary | ICD-10-CM | POA: Diagnosis not present

## 2018-09-20 DIAGNOSIS — M5416 Radiculopathy, lumbar region: Secondary | ICD-10-CM

## 2018-09-20 DIAGNOSIS — R293 Abnormal posture: Secondary | ICD-10-CM | POA: Diagnosis not present

## 2018-09-20 DIAGNOSIS — M5126 Other intervertebral disc displacement, lumbar region: Secondary | ICD-10-CM | POA: Diagnosis not present

## 2018-09-25 ENCOUNTER — Other Ambulatory Visit: Payer: Self-pay | Admitting: Medical

## 2018-09-26 ENCOUNTER — Ambulatory Visit (INDEPENDENT_AMBULATORY_CARE_PROVIDER_SITE_OTHER): Payer: BC Managed Care – PPO | Admitting: Gastroenterology

## 2018-09-26 ENCOUNTER — Encounter: Payer: Self-pay | Admitting: Gastroenterology

## 2018-09-26 VITALS — Ht 73.0 in | Wt 194.0 lb

## 2018-09-26 DIAGNOSIS — K625 Hemorrhage of anus and rectum: Secondary | ICD-10-CM | POA: Diagnosis not present

## 2018-09-26 NOTE — Progress Notes (Signed)
This patient contacted our office requesting a physician telemedicine consultation regarding clinical questions and/or test results.  If new patient, they were referred by PCP noted below  Participants on the conference : myself and patient   The patient consented to this consultation and was aware that a charge will be placed through their insurance.  I was in my office and the patient was at home   Encounter time:  Total time 35 minutes, with 30 minutes spent with patient on doximity   _____________________________________________________________________________________________              [name]:  History: [name] 09/26/2018  Referring provider: Jac Canavanysinger, David S, Jones  Reason for consult/chief complaint: Blood In Stools (now resolved, happened x2 )   Subjective  HPI: Primary care Jones from 09/01/2018 indicating several months of intermittent rectal bleeding and perianal/rectal burning.  He describes onset of constipation after a back surgery earlier this year, and then some bleeding began.  However, the bleeding persisted intermittently even after bowel habits normalized.  He was initially having some urinary trouble as well but that also resolved.  [name] was describing to primary care a feeling of "pulling" discomfort in his scrotum, and was reportedly evaluated by urology.   Last episode of bleeding was several weeks ago.  He denies abdominal pain, nausea, vomiting, early satiety, dysphagia, odynophagia or weight loss.  ROS:  Review of Systems He denies chest pain dyspnea or dysuria  Past Medical History: Past Medical History:  Diagnosis Date   Asthma    last asthma attack last year in 2012   Hearing impairment    wears hearing aids   History of kidney stones    Hypertension    off bp meds  last 2 years    Renal stone    Sarcoidosis    Wears glasses      Past Surgical History: Past Surgical History:    Procedure Laterality Date   CYSTOSCOPY W/ RETROGRADES Right 08/20/2015   Procedure: CYSTOSCOPY WITH RETROGRADE PYELOGRAM;  Surgeon: Sebastian Acheheodore Manny, MD;  [facility] [place];  Service: Urology;  Laterality: Right;   CYSTOSCOPY WITH URETEROSCOPY AND STENT PLACEMENT Right 08/20/2015   Procedure: CYSTOSCOPY WITH URETEROSCOPY AND STENT PLACEMENT;  Surgeon: Sebastian Acheheodore Manny, MD;  [facility] [place];  Service: Urology;  Laterality: Right;   CYSTOSCOPY/RETROGRADE/URETEROSCOPY  01/12/2012   Procedure: CYSTOSCOPY/RETROGRADE/URETEROSCOPY;  Surgeon: Sebastian Acheheodore Manny, MD;  Location: [facility];  Service: Urology;  Laterality: Right;   CYSTOSCOPY/URETEROSCOPY/HOLMIUM LASER/STENT PLACEMENT Left 06/16/2016   Procedure: STAGE ONE-CYSTOSCOPY/DIAGNOSTIC URETEROSCOPY/RETROGRADE/STENT PLACEMENT;  Surgeon: Sebastian Acheheodore Manny, MD;  Location: [facility];  Service: Urology;  Laterality: Left;   CYSTOSCOPY/URETEROSCOPY/HOLMIUM LASER/STENT PLACEMENT Left 06/30/2016   Procedure: STAGE TWO-CYSTOSCOPY/URETEROSCOPY/HOLMIUM LASER/STENT EXCHANGE;  Surgeon: Sebastian Acheheodore Manny, MD;  [facility] [place];  Service: Urology;  Laterality: Left;   ESOPHAGEAL DILATION     HOLMIUM LASER APPLICATION Right 08/20/2015   Procedure: HOLMIUM LASER APPLICATION;  Surgeon: Sebastian Acheheodore Manny, MD;  [facility] [place];  Service: Urology;  Laterality: Right;   NEPHROLITHOTOMY  02/11/2012   Procedure: NEPHROLITHOTOMY PERCUTANEOUS;  Surgeon: Sebastian Acheheodore Manny, MD;  Location: [facility];  Service: Urology;  Laterality: Right;   NEPHROLITHOTOMY Right 01/30/2015   Procedure: NEPHROLITHOTOMY PERCUTANEOUS, NEPHROSTOGRAM;  Surgeon: Sebastian Acheheodore Manny, MD;  Location: [facility];  Service: Urology;  Laterality: Right;   STONE EXTRACTION WITH BASKET Right 08/20/2015   Procedure: STONE EXTRACTION WITH BASKET;  Surgeon: Sebastian Acheheodore Manny, MD;  [facility] ;  Service: Urology;  Laterality: Right;    URETEROSCOPY  01/30/2015   Procedure: URETEROSCOPY WITH BASKETING;  Surgeon: Sebastian Acheheodore Manny, MD;  Location: [facility];  Service: Urology;;     Family History: Family History  Problem Relation Age of Onset   Hypertension Mother    Asthma Mother    Diabetes Father    Heart attack Father    Stroke Father    Liver disease Sister    Cancer Paternal Grandmother    Stomach cancer Neg Hx    Colon cancer Neg Hx    Pancreatic cancer Neg Hx    Esophageal cancer Neg Hx    No colon or rectal cancer  Social History: Social History   Socioeconomic History   Marital status: Divorced    Spouse name: Not on file   Number of children: 4   Years of education: Not on file   Highest education level: Not on file  Occupational History   Occupation: Theatre managerConstruction  Social Needs   Financial resource strain: Not on file   Food insecurity    Worry: Not on file    Inability: Not on file   Transportation needs    Medical: Not on file    Non-medical: Not on file  Tobacco Use   Smoking status: Former Smoker    Packs/day: 0.20    Years: 2.00    Pack years: 0.40    Types: Cigarettes    Quit date: 04/06/2007    Years since quitting: 11.4   Smokeless tobacco: Former NeurosurgeonUser    Types: Snuff    Quit date: 04/05/2013  Substance and Sexual Activity   Alcohol use: No   Drug use: No   Sexual activity: Yes    Birth control/protection: None  Lifestyle   Physical activity    Days per week: Not on file    Minutes per session: Not on file   Stress: Not on file  Relationships   Social connections    Talks on phone: Not on file    Gets together: Not on file    Attends religious service: Not on file    Active member of club or organization: Not on file    Attends meetings of clubs or organizations: Not on file    Relationship status: Not on file  Other Topics Concern   Not on file  Social History Narrative   Works in Teacher, [employer] construction   Construction - works  nights  Allergies: No Known Allergies  Outpatient Meds: Current Outpatient Medications  Medication Sig Dispense Refill   budesonide-formoterol (SYMBICORT) 160-4.5 MCG/ACT inhaler Inhale 2 puffs into the lungs 2 (two) times daily. 1 Inhaler 5   valsartan (DIOVAN) 80 MG tablet TAKE 1 TABLET BY MOUTH EVERY DAY 90 tablet 0   VENTOLIN HFA 108 (90 Base) MCG/ACT inhaler TAKE 2 PUFFS BY MOUTH EVERY 6 HOURS AS NEEDED FOR WHEEZE OR SHORTNESS OF BREATH 18 Inhaler 0   No current facility-administered medications for this visit.       ___________________________________________________________________ Objective   Exam:  No exam - virtual visit  Labs:  CBC Latest Ref Rng & Units 09/01/2018 02/09/2018 02/09/2018  WBC 3.4 - 10.8 x10E3/uL 3.9 - 4.0  Hemoglobin 13.0 - 17.7 g/dL 16.115.5 09.615.0 04.514.8  Hematocrit 37.5 - 51.0 % 43.9 44.0 43.3  Platelets 150 - 450 x10E3/uL 275 - 257    Assessment: Encounter Diagnosis  Name Primary?   Rectal bleeding Yes    Several months of intermittent rectal bleeding that persisted  despite resolution of constipation. I advised him to have a colonoscopy to rule out polyps or malignancy.  He was agreeable after thorough discussion of procedure and risks.  The benefits and risks of the planned procedure were described in detail with the patient or (when appropriate) their health care proxy.  Risks were outlined as including, but not limited to, bleeding, infection, perforation, adverse medication reaction leading to cardiac or pulmonary decompensation.  The limitation of incomplete mucosal visualization was also discussed.  No guarantees or warranties were given.  Plan:  My office will contact him to schedule a colonoscopy with me.  Thank you for the courtesy of this consult.  Please call me with any questions or concerns.  Nelida Meuse III  CC: Referring provider noted above

## 2018-09-27 ENCOUNTER — Telehealth: Payer: Self-pay | Admitting: Gastroenterology

## 2018-09-27 MED ORDER — NA SULFATE-K SULFATE-MG SULF 17.5-3.13-1.6 GM/177ML PO SOLN: 1.0000 | Freq: Once | ORAL | 0 refills | Status: AC

## 2018-09-27 NOTE — Patient Instructions (Signed)
If you are age 45 or older, your body mass index should be between 23-30. Your Body mass index is 25.6 kg/m. If this is out of the aforementioned range listed, please consider follow up with your Primary Care Provider.  If you are age 51 or younger, your body mass index should be between 19-25. Your Body mass index is 25.6 kg/m. If this is out of the aformentioned range listed, please consider follow up with your Primary Care Provider.   You have been scheduled for a colonoscopy. Please follow written instructions given to you at your visit today.  Please pick up your prep supplies at the pharmacy within the next 1-3 days. If you use inhalers (even only as needed), please bring them with you on the day of your procedure.  We have sent the following medications to your pharmacy for you to pick up at your convenience: Suprep  It was a pleasure to see you today!  Dr. Loletha Carrow

## 2018-09-27 NOTE — Telephone Encounter (Signed)
Colonoscopy has been scheduled for 10-10-2018 @ 3 pm

## 2018-09-27 NOTE — Telephone Encounter (Signed)
Patient called would like to know if you had reached him to schedule a colon

## 2018-09-27 NOTE — Addendum Note (Signed)
Addended by: Elias Else on: 09/27/2018 02:41 PM   Modules accepted: Orders

## 2018-10-09 ENCOUNTER — Telehealth: Payer: Self-pay | Admitting: Gastroenterology

## 2018-10-09 ENCOUNTER — Telehealth: Payer: Self-pay | Admitting: Orthopaedic Surgery

## 2018-10-09 NOTE — Telephone Encounter (Signed)
Received vm from [facility] checking on request for images. IC her back, lmvm. Advised same info as from 6/10 that we do not have xrays, any other imaging will need to be directed to [facility].

## 2018-10-09 NOTE — Telephone Encounter (Signed)

## 2018-10-10 ENCOUNTER — Ambulatory Visit (AMBULATORY_SURGERY_CENTER): Payer: BC Managed Care – PPO | Admitting: Gastroenterology

## 2018-10-10 ENCOUNTER — Other Ambulatory Visit: Payer: Self-pay

## 2018-10-10 ENCOUNTER — Encounter: Payer: Self-pay | Admitting: Gastroenterology

## 2018-10-10 VITALS — BP 116/76 | HR 72 | Temp 98.7°F | Resp 13 | Ht 73.0 in | Wt 194.0 lb

## 2018-10-10 DIAGNOSIS — K625 Hemorrhage of anus and rectum: Secondary | ICD-10-CM | POA: Diagnosis not present

## 2018-10-10 DIAGNOSIS — Z1211 Encounter for screening for malignant neoplasm of colon: Secondary | ICD-10-CM | POA: Diagnosis not present

## 2018-10-10 MED ORDER — SODIUM CHLORIDE 0.9 % IV SOLN: 500.0000 mL | Freq: Once | INTRAVENOUS | Status: DC

## 2018-10-10 NOTE — Op Note (Signed)
[facility] Patient Name: [name] Procedure Date: 10/10/2018 3:17 PM MRN: [id number] Endoscopist: [place]. Loletha Carrow , MD Age: 43 Referring MD:  Date of Birth: Sep 21, 1975 Gender: Male Account #: [id number] Procedure:                Colonoscopy Indications:              Rectal bleeding Medicines:                Monitored Anesthesia Care Procedure:                Pre-Anesthesia Assessment:                           - Prior to the procedure, a History and Physical                            was performed, and patient medications and                            allergies were reviewed. The patient's tolerance of                            previous anesthesia was also reviewed. The risks                            and benefits of the procedure and the sedation                            options and risks were discussed with the patient.                            All questions were answered, and informed consent                            was obtained. Prior Anticoagulants: The patient has                            taken no previous anticoagulant or antiplatelet                            agents. ASA Grade Assessment: II - A patient with                            mild systemic disease. After reviewing the risks                            and benefits, the patient was deemed in                            satisfactory condition to undergo the procedure.                           After obtaining informed consent, the colonoscope  was passed under direct vision. Throughout the                            procedure, the patient's blood pressure, pulse, and                            oxygen saturations were monitored continuously. The                            Colonoscope was introduced through the anus and                            advanced to the the cecum, identified by                            appendiceal orifice and ileocecal valve. The                 colonoscopy was performed with difficulty due to a                            redundant colon and a tortuous colon. Successful                            completion of the procedure was aided by changing                            the patient to a supine position and using manual                            pressure. The patient tolerated the procedure well.                            The quality of the bowel preparation was good. The                            ileocecal valve, appendiceal orifice, and rectum                            were photographed. Scope In: 3:34:41 PM Scope Out: 3:50:34 PM Scope Withdrawal Time: 0 hours 5 minutes 12 seconds  Total Procedure Duration: 0 hours 15 minutes 53 seconds  Findings:                 The perianal and digital rectal examinations were                            normal.                           The sigmoid colon was significantly tortuous,                            making scope advancement very challenging.  Retroflexion in the rectum was not performed due to                            narrow anatomy.                           The exam was otherwise without abnormality. Complications:            No immediate complications. Estimated Blood Loss:     Estimated blood loss: none. Impression:               - Tortuous colon.                           - The examination was otherwise normal.                           - No specimens collected.                           Benign anal bleeding. Recommendation:           - Patient has a contact number available for                            emergencies. The signs and symptoms of potential                            delayed complications were discussed with the                            patient. Return to normal activities tomorrow.                            Written discharge instructions were provided to the                            patient.                            - Resume previous diet.                           - Continue present medications.                           - Repeat colonoscopy in 10 years for screening                            purposes. Alternatively, Coloagurd could be                            performed at that time, given challenges of scope                            advancement.                           -  Return to referring physician. Henry L. Myrtie Neitheranis, MD 10/10/2018 3:59:09 PM This report has been signed electronically.

## 2018-10-10 NOTE — Progress Notes (Signed)
Pt's states no medical or surgical changes since previsit or office visit. 

## 2018-10-10 NOTE — Progress Notes (Signed)
A/ox3, pleased with MAC, report to RN 

## 2018-10-10 NOTE — Patient Instructions (Signed)
YOU HAD AN ENDOSCOPIC PROCEDURE TODAY AT [facility]:   Refer to the procedure report that was given to you for any specific questions about what was found during the examination.  If the procedure report does not answer your questions, please call your gastroenterologist to clarify.  If you requested that your care partner not be given the details of your procedure findings, then the procedure report has been included in a sealed envelope for you to review at your convenience later.  YOU SHOULD EXPECT: Some feelings of bloating in the abdomen. Passage of more gas than usual.  Walking can help get rid of the air that was put into your GI tract during the procedure and reduce the bloating. If you had a lower endoscopy (such as a colonoscopy or flexible sigmoidoscopy) you may notice spotting of blood in your stool or on the toilet paper. If you underwent a bowel prep for your procedure, you may not have a normal bowel movement for a few days.  Please Note:  You might notice some irritation and congestion in your nose or some drainage.  This is from the oxygen used during your procedure.  There is no need for concern and it should clear up in a day or so.  SYMPTOMS TO REPORT IMMEDIATELY:   Following lower endoscopy (colonoscopy or flexible sigmoidoscopy):  Excessive amounts of blood in the stool  Significant tenderness or worsening of abdominal pains  Swelling of the abdomen that is new, acute  Fever of 100F or higher  For urgent or emergent issues, a gastroenterologist can be reached at any hour by calling [phone].   DIET:  We do recommend a small meal at first, but then you may proceed to your regular diet.  Drink plenty of fluids but you should avoid alcoholic beverages for 24 hours.  ACTIVITY:  You should plan to take it easy for the rest of today and you should NOT DRIVE or use heavy machinery until tomorrow (because of the sedation medicines used during the test).     FOLLOW UP: Our staff will call the number listed on your records 48-72 hours following your procedure to check on you and address any questions or concerns that you may have regarding the information given to you following your procedure. If we do not reach you, we will leave a message.  We will attempt to reach you two times.  During this call, we will ask if you have developed any symptoms of COVID 19. If you develop any symptoms (ie: fever, flu-like symptoms, shortness of breath, cough etc.) before then, please call [phone].  If you test positive for Covid 19 in the 2 weeks post procedure, please call and report this information to [place].    If any biopsies were taken you will be contacted by phone or by letter within the next 1-3 weeks.  Please call us at [phone] if you have not heard about the biopsies in 3 weeks.   Repeat colonoscopy screening in 10 years Return to referring physician  SIGNATURES/CONFIDENTIALITY: You and/or your care partner have signed paperwork which will be entered into your electronic medical record.  These signatures attest to the fact that that the information above on your After Visit Summary has been reviewed and is understood.  Full responsibility of the confidentiality of this discharge information lies with you and/or your care-partner.

## 2018-10-12 ENCOUNTER — Telehealth: Payer: Self-pay | Admitting: *Deleted

## 2018-10-12 NOTE — Telephone Encounter (Signed)
  Follow up Call-  Call back number 10/10/2018  Post procedure Call Back phone  # [phone]  Permission to leave phone message Yes  Some recent data might be hidden     Patient questions:  Do you have a fever, pain , or abdominal swelling? No. Pain Score  0 *  Have you tolerated food without any problems? Yes.    Have you been able to return to your normal activities? Yes.    Do you have any questions about your discharge instructions: Diet   No. Medications  No. Follow up visit  No.  Do you have questions or concerns about your Care? No.  Actions: * If pain score is 4 or above: No action needed, pain <4.  1. Have you developed a fever since your procedure? NO  2.   Have you had an respiratory symptoms (SOB or cough) since your procedure? NO  3.   Have you tested positive for COVID 19 since your procedure NO  4.   Have you had any family members/close contacts diagnosed with the COVID 19 since your procedure?  NO   If yes to any of these questions please route to Joylene John, RN and Alphonsa Gin, RN.

## 2018-10-13 ENCOUNTER — Other Ambulatory Visit: Payer: Self-pay

## 2018-10-13 ENCOUNTER — Ambulatory Visit
Admission: RE | Admit: 2018-10-13 | Discharge: 2018-10-13 | Disposition: A | Discharge status: Home / Self Care | Payer: BLUE CROSS/BLUE SHIELD | Source: Ambulatory Visit | Attending: Neurosurgery | Admitting: Neurosurgery

## 2018-10-13 DIAGNOSIS — M5416 Radiculopathy, lumbar region: Secondary | ICD-10-CM

## 2018-10-13 DIAGNOSIS — M4807 Spinal stenosis, lumbosacral region: Secondary | ICD-10-CM | POA: Diagnosis not present

## 2018-10-13 DIAGNOSIS — M5127 Other intervertebral disc displacement, lumbosacral region: Secondary | ICD-10-CM | POA: Diagnosis not present

## 2018-10-13 MED ORDER — GADOBENATE DIMEGLUMINE 529 MG/ML IV SOLN
18.0000 mL | Freq: Once | INTRAVENOUS | Status: AC | PRN
  Administered 2018-10-13: 18 mL via INTRAVENOUS

## 2018-10-19 DIAGNOSIS — M542 Cervicalgia: Secondary | ICD-10-CM | POA: Diagnosis not present

## 2018-10-19 DIAGNOSIS — M5440 Lumbago with sciatica, unspecified side: Secondary | ICD-10-CM | POA: Diagnosis not present

## 2018-10-30 ENCOUNTER — Other Ambulatory Visit: Payer: Self-pay | Admitting: Neurosurgery

## 2018-10-30 DIAGNOSIS — M5416 Radiculopathy, lumbar region: Secondary | ICD-10-CM

## 2018-11-13 DIAGNOSIS — M5416 Radiculopathy, lumbar region: Secondary | ICD-10-CM | POA: Diagnosis not present

## 2018-11-13 DIAGNOSIS — M7918 Myalgia, other site: Secondary | ICD-10-CM | POA: Diagnosis not present

## 2018-11-13 DIAGNOSIS — M5412 Radiculopathy, cervical region: Secondary | ICD-10-CM | POA: Diagnosis not present

## 2018-11-13 DIAGNOSIS — M5126 Other intervertebral disc displacement, lumbar region: Secondary | ICD-10-CM | POA: Diagnosis not present

## 2018-11-21 ENCOUNTER — Other Ambulatory Visit: Payer: Self-pay

## 2018-11-21 ENCOUNTER — Ambulatory Visit
Admission: RE | Admit: 2018-11-21 | Discharge: 2018-11-21 | Disposition: A | Discharge status: Home / Self Care | Payer: BC Managed Care – PPO | Source: Ambulatory Visit | Attending: Neurosurgery | Admitting: Neurosurgery

## 2018-11-21 DIAGNOSIS — M5416 Radiculopathy, lumbar region: Secondary | ICD-10-CM

## 2018-11-22 DIAGNOSIS — M5126 Other intervertebral disc displacement, lumbar region: Secondary | ICD-10-CM | POA: Diagnosis not present

## 2018-11-22 DIAGNOSIS — M5416 Radiculopathy, lumbar region: Secondary | ICD-10-CM | POA: Diagnosis not present

## 2018-11-30 DIAGNOSIS — Z79899 Other long term (current) drug therapy: Secondary | ICD-10-CM | POA: Diagnosis not present

## 2018-11-30 DIAGNOSIS — M5416 Radiculopathy, lumbar region: Secondary | ICD-10-CM | POA: Diagnosis not present

## 2018-11-30 DIAGNOSIS — M5126 Other intervertebral disc displacement, lumbar region: Secondary | ICD-10-CM | POA: Diagnosis not present

## 2018-11-30 DIAGNOSIS — I10 Essential (primary) hypertension: Secondary | ICD-10-CM | POA: Diagnosis not present

## 2018-12-06 ENCOUNTER — Telehealth: Payer: Self-pay | Admitting: Medical

## 2018-12-06 ENCOUNTER — Encounter: Payer: Self-pay | Admitting: Medical

## 2018-12-06 ENCOUNTER — Other Ambulatory Visit: Payer: Self-pay

## 2018-12-06 ENCOUNTER — Ambulatory Visit: Payer: BC Managed Care – PPO | Admitting: Medical

## 2018-12-06 VITALS — BP 150/114 | HR 71 | Temp 98.4°F | Ht 73.0 in | Wt 198.2 lb

## 2018-12-06 DIAGNOSIS — M79604 Pain in right leg: Secondary | ICD-10-CM | POA: Diagnosis not present

## 2018-12-06 DIAGNOSIS — M5410 Radiculopathy, site unspecified: Secondary | ICD-10-CM | POA: Diagnosis not present

## 2018-12-06 DIAGNOSIS — R0989 Other specified symptoms and signs involving the circulatory and respiratory systems: Secondary | ICD-10-CM

## 2018-12-06 DIAGNOSIS — M549 Dorsalgia, unspecified: Secondary | ICD-10-CM

## 2018-12-06 DIAGNOSIS — M79605 Pain in left leg: Secondary | ICD-10-CM

## 2018-12-06 DIAGNOSIS — Z9889 Other specified postprocedural states: Secondary | ICD-10-CM | POA: Insufficient documentation

## 2018-12-06 DIAGNOSIS — I10 Essential (primary) hypertension: Secondary | ICD-10-CM

## 2018-12-06 NOTE — Progress Notes (Signed)
Subjective: Chief Complaint  Patient presents with  . Back Pain    upper radiating down bilateral legs-got cortisone shot 8/19   Here today for back pain rating down the legs.  He notes that the [facility] told him to follow-up with [place] today.  He has a history of back pain, history of neck and lumbar spine surgery.  He sees Dr. Saintclair Halsted with [facility] neuro[facility].  He had been seeing Dr. Saintclair Halsted who recently referred him to [facility] in the same office for pain management.  Dr. Assunta Curtis recently did an epidural injection of steroid recently to help with his pain.  This was done on August 19.  Since then he feels like he has back pain and leg pain has worsened, legs have radiating pain now worse than before.  He notes at time his feet feel tingly.  He wonders if his nerve was hit during injection of steroid.  He denies incontinence, no saddle anesthesia, no fevers, no recent weight changes.  Denies belly pain, no nausea or vomiting.  No fever no URI symptoms.  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  This has not happened before.  Overall his pain is pretty constant.  No leg swelling, no fever, no blood in the stool.  No chest pain or difficulty breathing  No other c/o  Past Medical History:  Diagnosis Date  . Asthma    last asthma attack last year in 2012  . Hearing impairment    wears hearing aids  . History of kidney stones   . Hypertension    off bp meds  last 2 years   . Renal stone   . Sarcoidosis   . Wears glasses    Current Outpatient Medications on File Prior to Visit  Medication Sig Dispense Refill  . budesonide-formoterol (SYMBICORT) 160-4.5 MCG/ACT inhaler Inhale 2 puffs into the lungs 2 (two) times daily. 1 Inhaler 5  . HYDROcodone-acetaminophen (NORCO/VICODIN) 5-325 MG tablet TAKE 1 TO 2 TABLETS BY MOUTH EVERY DAY AS NEEDED FOR PAIN    . methocarbamol (ROBAXIN) 500 MG tablet  TAKE 1 TABLET BY MOUTH EVERY 8 HOURS AS NEEDED FOR SPASMS    . valsartan (DIOVAN) 80 MG tablet TAKE 1 TABLET BY MOUTH EVERY DAY 90 tablet 0  . VENTOLIN HFA 108 (90 Base) MCG/ACT inhaler TAKE 2 PUFFS BY MOUTH EVERY 6 HOURS AS NEEDED FOR WHEEZE OR SHORTNESS OF BREATH 18 Inhaler 0   Current Facility-Administered Medications on File Prior to Visit  Medication Dose Route Frequency Provider Last Rate Last Dose  . 0.9 %  sodium chloride infusion  500 mL Intravenous Once Doran Stabler, MD       Past Surgical History:  Procedure Laterality Date  . CYSTOSCOPY W/ RETROGRADES Right 08/20/2015   Procedure: CYSTOSCOPY WITH RETROGRADE PYELOGRAM;  Surgeon: Alexis Frock, MD;  Location: [facility];  Service: Urology;  Laterality: Right;  . CYSTOSCOPY WITH URETEROSCOPY AND STENT PLACEMENT Right 08/20/2015   Procedure: CYSTOSCOPY WITH URETEROSCOPY AND STENT PLACEMENT;  Surgeon: Alexis Frock, MD;  Location: [facility];  Service: Urology;  Laterality: Right;  . CYSTOSCOPY/RETROGRADE/URETEROSCOPY  01/12/2012   Procedure: CYSTOSCOPY/RETROGRADE/URETEROSCOPY;  Surgeon: Alexis Frock, MD;  Location: [facility];  Service: Urology;  Laterality: Right;  . CYSTOSCOPY/URETEROSCOPY/HOLMIUM LASER/STENT PLACEMENT Left 06/16/2016   Procedure: STAGE ONE-CYSTOSCOPY/DIAGNOSTIC URETEROSCOPY/RETROGRADE/STENT PLACEMENT;  Surgeon: Alexis Frock, MD;  Location: [facility];  Service: Urology;  Laterality: Left;  . CYSTOSCOPY/URETEROSCOPY/HOLMIUM LASER/STENT PLACEMENT Left 06/30/2016   Procedure: STAGE TWO-CYSTOSCOPY/URETEROSCOPY/HOLMIUM LASER/STENT EXCHANGE;  Surgeon: Sebastian Acheheodore Manny, MD;  [facility] [place];  Service: Urology;  Laterality: Left;  . ESOPHAGEAL DILATION    . HOLMIUM LASER APPLICATION Right 08/20/2015   Procedure: HOLMIUM LASER APPLICATION;  Surgeon: Sebastian Acheheodore Manny, MD;  [facility] [place];  Service: Urology;  Laterality: Right;  . NEPHROLITHOTOMY  02/11/2012    Procedure: NEPHROLITHOTOMY PERCUTANEOUS;  Surgeon: Sebastian Acheheodore Manny, MD;  Location: [facility];  Service: Urology;  Laterality: Right;  . NEPHROLITHOTOMY Right 01/30/2015   Procedure: NEPHROLITHOTOMY PERCUTANEOUS, NEPHROSTOGRAM;  Surgeon: Sebastian Acheheodore Manny, MD;  Location: [facility];  Service: Urology;  Laterality: Right;  . STONE EXTRACTION WITH BASKET Right 08/20/2015   Procedure: STONE EXTRACTION WITH BASKET;  Surgeon: Sebastian Acheheodore Manny, MD;  [facility] [place];  Service: Urology;  Laterality: Right;  . URETEROSCOPY  01/30/2015   Procedure: URETEROSCOPY WITH BASKETING;  Surgeon: Sebastian Acheheodore Manny, MD;  Location: [facility];  Service: Urology;;    Objective: BP (!) 150/114   Pulse 71   Temp 98.4 F (36.9 C) (Oral)   Ht 6\' 1"  (1.854 m)   Wt 198 lb 3.2 oz (89.9 kg)   SpO2 98%   BMI 26.15 kg/m   BP Readings from Last 3 Encounters:  12/06/18 (!) 150/114  10/10/18 116/76  09/01/18 140/90   Wt Readings from Last 3 Encounters:  12/06/18 198 lb 3.2 oz (89.9 kg)  10/10/18 194 lb (88 kg)  09/26/18 194 lb (88 kg)    Gen: wd,wn nad, lean African American male Nontender of back and legs Back range of motion is limited due to pain, only about 15 degrees of flexion, about 5 degrees of extension of the back.  Lumbar surgical scar noted Legs nontender normal range of motion no deformity no swelling 1+ pedal pulses, no extremity edema Legs seem to be 4-5 out of 5 strength, DTRs 1+, normal sensation    Assessment: Encounter Diagnoses  Name Primary?  . Pain in both lower extremities Yes  . Radicular pain of lower extremity   . Decreased pedal pulses   . History of back surgery   . Back pain, unspecified back location, unspecified back pain laterality, unspecified chronicity   . Essential hypertension, benign      Plan: His blood pressure is elevated today although he usually runs in the normal range.  We will need to monitor this.  Continue current medication.  He has decreased  pulses in both legs.  I do not think that is causing his current pain issues but we will go ahead and check an ABI given the decreased pulses.  Referral made  I advised he follow-up with his back surgeon as this seems to be a worsening of his lumbar issues with radicular symptoms.  He reportedly has an MRI pending to reexamine the lumbar spine.  Follow-up for MRI, advised if any worsening symptoms in the next few days such as fever, incontinence, saddle anesthesia, worsening ability to walk or ambulate and get to the hospital emergency department  [name] was seen today for back pain.  Diagnoses and all orders for this visit:  Pain in both lower extremities -     VAS US ABI WITH/WO TBI; Future  Radicular pain of lower extremity -     VAS US ABI WITH/WO TBI; Future  Decreased pedal pulses -     VAS US ABI  WITH/WO TBI; Future  History of back surgery  Back pain, unspecified back location, unspecified back pain laterality, unspecified chronicity  Essential hypertension, benign

## 2018-12-06 NOTE — Telephone Encounter (Signed)
See other message , [facility] of to[facility]

## 2018-12-06 NOTE — Telephone Encounter (Signed)
I did not realize until he left his blood pressure was quite high today.  See if he is taking his blood pressure readings at home, and if so what are some recent readings?  Is he good about taking his blood pressure pill every day?  Refer for ABI, ankle-brachial index through vascular surgery office

## 2018-12-07 ENCOUNTER — Ambulatory Visit (HOSPITAL_COMMUNITY)
Admission: RE | Admit: 2018-12-07 | Discharge: 2018-12-07 | Disposition: A | Discharge status: Home / Self Care | Payer: BC Managed Care – PPO | Source: Ambulatory Visit | Attending: Cardiovascular Disease | Admitting: Cardiovascular Disease

## 2018-12-07 DIAGNOSIS — M5416 Radiculopathy, lumbar region: Secondary | ICD-10-CM | POA: Diagnosis not present

## 2018-12-07 DIAGNOSIS — M79604 Pain in right leg: Secondary | ICD-10-CM | POA: Diagnosis not present

## 2018-12-07 DIAGNOSIS — M79605 Pain in left leg: Secondary | ICD-10-CM

## 2018-12-07 DIAGNOSIS — M5410 Radiculopathy, site unspecified: Secondary | ICD-10-CM | POA: Diagnosis not present

## 2018-12-07 DIAGNOSIS — R0989 Other specified symptoms and signs involving the circulatory and respiratory systems: Secondary | ICD-10-CM | POA: Insufficient documentation

## 2018-12-07 NOTE — Telephone Encounter (Signed)
Patient stated he has not been taking his blood pressure medication every day. He just started back on it yesterday which would be part of the reason it was high. He also has not been taking the readings at home.

## 2018-12-07 NOTE — Telephone Encounter (Signed)
Copy of note has been sent to [facility]. Patient had ABI done this morning

## 2018-12-14 NOTE — Progress Notes (Signed)
Patient was seen by [facility]. Notes has been faxed to Dr. Hulen Shouts

## 2018-12-19 ENCOUNTER — Other Ambulatory Visit: Payer: Self-pay | Admitting: Medical

## 2018-12-19 DIAGNOSIS — M5126 Other intervertebral disc displacement, lumbar region: Secondary | ICD-10-CM | POA: Diagnosis not present

## 2018-12-19 DIAGNOSIS — M5416 Radiculopathy, lumbar region: Secondary | ICD-10-CM | POA: Diagnosis not present

## 2018-12-19 DIAGNOSIS — M4807 Spinal stenosis, lumbosacral region: Secondary | ICD-10-CM | POA: Diagnosis not present

## 2018-12-28 ENCOUNTER — Other Ambulatory Visit: Payer: Self-pay | Admitting: Medical

## 2018-12-28 DIAGNOSIS — I10 Essential (primary) hypertension: Secondary | ICD-10-CM | POA: Diagnosis not present

## 2018-12-28 DIAGNOSIS — M5416 Radiculopathy, lumbar region: Secondary | ICD-10-CM | POA: Diagnosis not present

## 2018-12-28 DIAGNOSIS — Z6827 Body mass index (BMI) 27.0-27.9, adult: Secondary | ICD-10-CM | POA: Diagnosis not present

## 2018-12-28 DIAGNOSIS — M7918 Myalgia, other site: Secondary | ICD-10-CM | POA: Diagnosis not present

## 2018-12-28 NOTE — Telephone Encounter (Signed)
Spoke to patient and he stated he needs this medication even though it was discontinued prior. Please advise if this is ok.

## 2019-01-18 ENCOUNTER — Other Ambulatory Visit: Payer: Self-pay | Admitting: Medical

## 2019-03-05 DIAGNOSIS — M5416 Radiculopathy, lumbar region: Secondary | ICD-10-CM | POA: Diagnosis not present

## 2019-03-05 DIAGNOSIS — F1120 Opioid dependence, uncomplicated: Secondary | ICD-10-CM | POA: Diagnosis not present

## 2019-03-05 DIAGNOSIS — M542 Cervicalgia: Secondary | ICD-10-CM | POA: Diagnosis not present

## 2019-03-05 DIAGNOSIS — M7918 Myalgia, other site: Secondary | ICD-10-CM | POA: Diagnosis not present

## 2019-03-19 ENCOUNTER — Ambulatory Visit: Payer: BC Managed Care – PPO | Admitting: Medical

## 2019-03-19 ENCOUNTER — Encounter: Payer: Self-pay | Admitting: Medical

## 2019-03-19 ENCOUNTER — Other Ambulatory Visit: Payer: Self-pay

## 2019-03-19 VITALS — BP 114/88 | Ht 73.0 in | Wt 188.0 lb

## 2019-03-19 DIAGNOSIS — R12 Heartburn: Secondary | ICD-10-CM

## 2019-03-19 MED ORDER — OMEPRAZOLE 40 MG PO CPDR: 40.0000 mg | DELAYED_RELEASE_CAPSULE | Freq: Every day | ORAL | 1 refills | Status: DC

## 2019-03-19 NOTE — Progress Notes (Signed)
Subjective: Chief Complaint  Patient presents with  . Heartburn   Virtual consult for heartburn.  Having heartburn 3-4 days now, worse at night.  Worried he swallowed something that stuck in throat.  Tried drinking milk, apple cider vinegar, pepto.  Not taking any H2 or PPI medicaiton.  Ate some pizza several days ago.   No other foods he ate recently that he thought was acidic.   No eating close to bedtime.  No recent alcohol.  No recent NSAID use.  Not eating fast, no recent large portions.  Feels chest heartburn.  But no   HTN - checks BP at home.   Getting 108/67, 104/72, 154/100, 144/104, fluctuation of numbers.  Compliant with BP medication.  Been doing some bike riding, and home exercising.   Denies chest pain or dyspnea with exercise or bike riding.   No other palpitations, CP, SOB, edema.     Past Medical History:  Diagnosis Date  . Asthma    last asthma attack last year in 2012  . Hearing impairment    wears hearing aids  . History of kidney stones   . Hypertension    off bp meds  last 2 years   . Renal stone   . Sarcoidosis   . Wears glasses    Current Outpatient Medications on File Prior to Visit  Medication Sig Dispense Refill  . HYDROcodone-acetaminophen (NORCO/VICODIN) 5-325 MG tablet TAKE 1 TO 2 TABLETS BY MOUTH EVERY DAY AS NEEDED FOR PAIN    . levocetirizine (XYZAL) 5 MG tablet TAKE 1 TABLET BY MOUTH EVERY DAY IN THE EVENING (Patient not taking: Reported on 03/19/2019) 90 tablet 1  . methocarbamol (ROBAXIN) 500 MG tablet TAKE 1 TABLET BY MOUTH EVERY 8 HOURS AS NEEDED FOR SPASMS    . SYMBICORT 160-4.5 MCG/ACT inhaler TAKE 2 PUFFS BY MOUTH TWICE A DAY (Patient not taking: Reported on 03/19/2019) 30.6 Inhaler 1  . valsartan (DIOVAN) 80 MG tablet TAKE 1 TABLET BY MOUTH EVERY DAY (Patient not taking: Reported on 03/19/2019) 90 tablet 0  . VENTOLIN HFA 108 (90 Base) MCG/ACT inhaler TAKE 2 PUFFS BY MOUTH EVERY 6 HOURS AS NEEDED FOR WHEEZE OR SHORTNESS OF BREATH (Patient  not taking: Reported on 03/19/2019) 18 Inhaler 0   Current Facility-Administered Medications on File Prior to Visit  Medication Dose Route Frequency Provider Last Rate Last Admin  . 0.9 %  sodium chloride infusion  500 mL Intravenous Once Nelida Meuse III, MD       ROS as in subjective  Objective: BP 114/88   Ht 6\' 1"  (1.854 m)   Wt 188 lb (85.3 kg)   BMI 24.80 kg/m   BP Readings from Last 3 Encounters:  03/19/19 114/88  12/06/18 (!) 150/114  10/10/18 116/76   Not examined as this was a virtual consult    Assessment: Encounter Diagnosis  Name Primary?  . Heartburn Yes     Plan: We discussed symptoms and concerns, differential diagnoses.  We discussed limitations of virtual consult.  Begin omeprazole, can use Tums as needed, avoid GERD triggers, avoid eating fast, avoid eating close to bedtime.  If not much improved within the next 5 to 7 days call back.  If new or different symptoms come in for an person visit  Nachum was seen today for heartburn.  Diagnoses and all orders for this visit:  Heartburn  Other orders -     omeprazole (PRILOSEC) 40 MG capsule; Take 1 capsule (40 mg total) by mouth  daily.

## 2019-03-20 ENCOUNTER — Other Ambulatory Visit: Payer: Self-pay | Admitting: Medical

## 2019-04-04 ENCOUNTER — Encounter: Payer: Self-pay | Admitting: Medical

## 2019-04-04 ENCOUNTER — Ambulatory Visit: Payer: BC Managed Care – PPO | Admitting: Medical

## 2019-04-04 ENCOUNTER — Other Ambulatory Visit: Payer: Self-pay | Admitting: Medical

## 2019-04-04 ENCOUNTER — Other Ambulatory Visit: Payer: Self-pay

## 2019-04-04 VITALS — BP 168/98 | HR 89 | Temp 97.8°F | Ht 72.0 in | Wt 202.4 lb

## 2019-04-04 DIAGNOSIS — I10 Essential (primary) hypertension: Secondary | ICD-10-CM | POA: Diagnosis not present

## 2019-04-04 DIAGNOSIS — N1330 Unspecified hydronephrosis: Secondary | ICD-10-CM

## 2019-04-04 DIAGNOSIS — Z87442 Personal history of urinary calculi: Secondary | ICD-10-CM | POA: Diagnosis not present

## 2019-04-04 DIAGNOSIS — N289 Disorder of kidney and ureter, unspecified: Secondary | ICD-10-CM | POA: Diagnosis not present

## 2019-04-04 DIAGNOSIS — Z8249 Family history of ischemic heart disease and other diseases of the circulatory system: Secondary | ICD-10-CM

## 2019-04-04 LAB — POCT URINALYSIS DIP (PROADVANTAGE DEVICE)
Bilirubin, UA: NEGATIVE
Blood, UA: NEGATIVE
Glucose, UA: NEGATIVE mg/dL
Ketones, POC UA: NEGATIVE mg/dL
Leukocytes, UA: NEGATIVE
Nitrite, UA: NEGATIVE
Protein Ur, POC: NEGATIVE mg/dL
Specific Gravity, Urine: 1.02
Urobilinogen, Ur: NEGATIVE
pH, UA: 6 (ref 5.0–8.0)

## 2019-04-04 MED ORDER — AMLODIPINE-VALSARTAN-HCTZ 5-160-12.5 MG PO TABS: 1.0000 | ORAL_TABLET | Freq: Every day | ORAL | 2 refills | Status: DC

## 2019-04-04 NOTE — Patient Instructions (Signed)
Recommendations: Lets change your BP medication.  Lets stop plain Valsartan/Diovan, and instead begin :  Amlodipine Valsartan HCT 5/160/12.5mg , 1 tablet daily.    If this is too expensive let me know immediately Lets plan recheck in 1 month.  Specific recommendations:  Limit salt and soda in the diet, limit or avoid fast food and fried foods.  Exercise regularly, preferably every day with aerobic exercise for 30 minutes or more  Avoid added salt in the diet.   Follow a DASH eating plan or Mediterranean eating plan   Hypertension, commonly called high blood pressure, is when the force of blood pumping through your arteries is too strong. Your arteries are the blood vessels that carry blood from your heart throughout your body. A blood pressure reading consists of a higher number over a lower number, such as 110/72. The higher number (systolic) is the pressure inside your arteries when your heart pumps. The lower number (diastolic) is the pressure inside your arteries when your heart relaxes. Ideally you want your blood pressure below 120/80. Hypertension forces your heart to work harder to pump blood. Your arteries may become narrow or stiff. Having hypertension puts you at risk for heart disease, stroke, and other problems.  RISK FACTORS Some risk factors for high blood pressure are controllable. Others are not.  Risk factors you cannot control include:   Race. You may be at higher risk if you are African American.  Age. Risk increases with age.  Gender. Men are at higher risk than women before age 3 years. After age 40, women are at higher risk than men. Risk factors you can control include:  Not getting enough exercise or physical activity.  Being overweight.  Getting too much fat, sugar, calories, or salt in your diet.  Drinking too much alcohol. SIGNS AND SYMPTOMS Hypertension does not usually cause signs or symptoms. Extremely high blood pressure (hypertensive crisis)  may cause headache, anxiety, shortness of breath, and nosebleed. DIAGNOSIS  To check if you have hypertension, your health care provider will measure your blood pressure while you are seated, with your arm held at the level of your heart. It should be measured at least twice using the same arm. Certain conditions can cause a difference in blood pressure between your right and left arms. A blood pressure reading that is higher than normal on one occasion does not mean that you need treatment. If one blood pressure reading is high, ask your health care provider about having it checked again. BLOOD PRESSURE STAGES Blood pressure is classified into four stages: normal, prehypertension, stage 1, and stage 2. Your blood pressure reading will be used to determine what type of treatment, if any, is necessary. Appropriate treatment options are tied to these four stages:  Normal  Systolic pressure (mm Hg): below 120.  Diastolic pressure (mm Hg): below 80. Prehypertension  Systolic pressure (mm Hg): 120 to 139.  Diastolic pressure (mm Hg): 80 to 89. Stage1  Systolic pressure (mm Hg): 140 to 159.  Diastolic pressure (mm Hg): 90 to 99. Stage2  Systolic pressure (mm Hg): 160 or above.  Diastolic pressure (mm Hg): 100 or above. RISKS RELATED TO HIGH BLOOD PRESSURE Managing your blood pressure is an important responsibility. Uncontrolled high blood pressure can lead to:  A heart attack.  A stroke.  A weakened blood vessel (aneurysm).  Heart failure.  Kidney damage.  Eye damage.  Metabolic syndrome.  Memory and concentration problems. TREATMENT  Treating high blood pressure includes making lifestyle changes  and possibly taking medicine. Living a healthy lifestyle can help lower high blood pressure. You may need to change some of your habits. Lifestyle changes may include:  Following the DASH diet. This diet is high in fruits, vegetables, and whole grains. It is low in salt, red meat,  and added sugars.  Getting at least 2 hours of brisk physical activity every week.  Losing weight if necessary.  Not smoking.  Limiting alcoholic beverages.  Learning ways to reduce stress. If lifestyle changes are not enough to get your blood pressure under control, your health care provider may prescribe medicine. You may need to take more than one. Work closely with your health care provider to understand the risks and benefits. HOME CARE INSTRUCTIONS  Have your blood pressure rechecked as directed by your health care provider.   Take medicines only as directed by your health care provider. Follow the directions carefully. Blood pressure medicines must be taken as prescribed. The medicine does not work as well when you skip doses. Skipping doses also puts you at risk for problems.   Do not smoke.   Monitor your blood pressure at home as directed by your health care provider. SEEK MEDICAL CARE IF:   You think you are having a reaction to medicines taken.  You have recurrent headaches or feel dizzy.  You have swelling in your ankles.  You have trouble with your vision. SEEK IMMEDIATE MEDICAL CARE IF:  You develop a severe headache or confusion.  You have unusual weakness, numbness, or feel faint.  You have severe chest or abdominal pain.  You vomit repeatedly.  You have trouble breathing. MAKE SURE YOU:   Understand these instructions.  Will watch your condition.  Will get help right away if you are not doing well or get worse. Document Released: 03/22/2005 Document Revised: 08/06/2013 Document Reviewed: 01/12/2013 [facility] Patient Information 2015 [place], [place]. This information is not intended to replace advice given to you by your health care provider. Make sure you discuss any questions you have with your health care provider.

## 2019-04-04 NOTE — Progress Notes (Signed)
Subjective:  [name] is a 43 y.o. male who presents for Chief Complaint  Patient presents with  . Hypertension  . Headache  . Numbness    bilateral feet      Here today for several concerns.  Lately he has been having some numbness in his feet and tingling sensations.  Been having some headaches but he attributes this to his chronic neck and back pain.  His main concern is ongoing elevated blood pressure readings of late.  Home readings are averaging around 170/118.  He denies palpitations, shortness of breath, edema, chest pain.  He uses no real diet discretion, drinks soda rarely eats fried food and fast food.   He is compliant with his blood pressure medication.  No other aggravating or relieving factors.    No other c/o.  Past Medical History:  Diagnosis Date  . Asthma    last asthma attack last year in 2012  . Hearing impairment    wears hearing aids  . History of kidney stones   . Hypertension    off bp meds  last 2 years   . Renal stone   . Sarcoidosis   . Wears glasses    Current Outpatient Medications on File Prior to Visit  Medication Sig Dispense Refill  . valsartan (DIOVAN) 80 MG tablet TAKE 1 TABLET BY MOUTH EVERY DAY 90 tablet 0  . HYDROcodone-acetaminophen (NORCO/VICODIN) 5-325 MG tablet TAKE 1 TO 2 TABLETS BY MOUTH EVERY DAY AS NEEDED FOR PAIN    . levocetirizine (XYZAL) 5 MG tablet TAKE 1 TABLET BY MOUTH EVERY DAY IN THE EVENING (Patient not taking: Reported on 03/19/2019) 90 tablet 1  . methocarbamol (ROBAXIN) 500 MG tablet TAKE 1 TABLET BY MOUTH EVERY 8 HOURS AS NEEDED FOR SPASMS    . omeprazole (PRILOSEC) 40 MG capsule Take 1 capsule (40 mg total) by mouth daily. (Patient not taking: Reported on 04/04/2019) 30 capsule 1  . SYMBICORT 160-4.5 MCG/ACT inhaler TAKE 2 PUFFS BY MOUTH TWICE A DAY (Patient not taking: Reported on 03/19/2019) 30.6 Inhaler 1  . VENTOLIN HFA 108 (90 Base) MCG/ACT inhaler TAKE 2 PUFFS BY MOUTH EVERY 6 HOURS AS NEEDED FOR WHEEZE OR  SHORTNESS OF BREATH (Patient not taking: Reported on 03/19/2019) 18 Inhaler 0   Current Facility-Administered Medications on File Prior to Visit  Medication Dose Route Frequency Provider Last Rate Last Admin  . 0.9 %  sodium chloride infusion  500 mL Intravenous Once Sherrilyn Rist, MD       Family History  Problem Relation Age of Onset  . Hypertension Mother   . Asthma Mother   . Diabetes Father   . Heart attack Father   . Stroke Father   . Liver disease Sister   . Cancer Paternal Grandmother   . Stomach cancer Neg Hx   . Colon cancer Neg Hx   . Pancreatic cancer Neg Hx   . Esophageal cancer Neg Hx      The following portions of the patient's history were reviewed and updated as appropriate: allergies, current medications, past family history, past medical history, past social history, past surgical history and problem list.  ROS Otherwise as in subjective above  Objective: BP (!) 168/98   Pulse 89   Temp 97.8 F (36.6 C)   Ht 6' (1.829 m)   Wt 202 lb 6.4 oz (91.8 kg)   SpO2 97%   BMI 27.45 kg/m    Wt Readings from Last 3 Encounters:  04/04/19 202 lb 6.4 oz (91.8 kg)  03/19/19 188 lb (85.3 kg)  12/06/18 198 lb 3.2 oz (89.9 kg)   BP Readings from Last 3 Encounters:  04/04/19 (!) 168/98  03/19/19 114/88  12/06/18 (!) 150/114   General appearance: alert, no distress, well developed, well nourished Neck: supple, no lymphadenopathy, no thyromegaly, no masses, no bruits Heart: RRR, normal S1, S2, no murmurs Lungs: CTA bilaterally, no wheezes, rhonchi, or rales Abdomen: +bs, soft, non tender, non distended, no masses, no hepatomegaly, no splenomegaly, no bruits Pulses: 2+ radial pulses, 2+ pedal pulses, normal cap refill Ext: no edema  EKG nsr, no acute change from prior 2018 EKG    Assessment: Encounter Diagnoses  Name Primary?  . Essential hypertension, benign Yes  . History of kidney stones   . Hydronephrosis, unspecified hydronephrosis type   .  Family history of heart disease   . Impaired renal function      Plan: Reviewed EKG Counseled on diet, exericse, limiting salt.    Labs today Change to medication as below   [name] was seen today for hypertension, headache and numbness.  Diagnoses and all orders for this visit:  Essential hypertension, benign -     Renal Function Panel -     EKG 12-Lead  History of kidney stones -     Renal Function Panel -     EKG 12-Lead  Hydronephrosis, unspecified hydronephrosis type -     Renal Function Panel -     EKG 12-Lead  Family history of heart disease  Impaired renal function -     Renal Function Panel -     EKG 12-Lead  Other orders -     amLODIPine-Valsartan-HCTZ 5-160-12.5 MG TABS; Take 1 tablet by mouth daily.    Follow up: pending labs

## 2019-04-04 NOTE — Addendum Note (Signed)
Addended by: Edgar Frisk on: 04/04/2019 03:26 PM   Modules accepted: Orders

## 2019-04-05 LAB — RENAL FUNCTION PANEL
Albumin: 4.6 g/dL (ref 4.0–5.0)
BUN/Creatinine Ratio: 9 (ref 9–20)
BUN: 14 mg/dL (ref 6–24)
CO2: 20 mmol/L (ref 20–29)
Calcium: 9.6 mg/dL (ref 8.7–10.2)
Chloride: 102 mmol/L (ref 96–106)
Creatinine, Ser: 1.53 mg/dL — ABNORMAL HIGH (ref 0.76–1.27)
GFR calc Af Amer: 63 mL/min/1.73
GFR calc non Af Amer: 54 mL/min/1.73 — ABNORMAL LOW
Glucose: 93 mg/dL (ref 65–99)
Phosphorus: 2.8 mg/dL (ref 2.8–4.1)
Potassium: 4.3 mmol/L (ref 3.5–5.2)
Sodium: 138 mmol/L (ref 134–144)

## 2019-04-10 ENCOUNTER — Other Ambulatory Visit: Payer: Self-pay | Admitting: Medical

## 2019-04-30 DIAGNOSIS — R338 Other retention of urine: Secondary | ICD-10-CM | POA: Diagnosis not present

## 2019-04-30 DIAGNOSIS — N202 Calculus of kidney with calculus of ureter: Secondary | ICD-10-CM | POA: Diagnosis not present

## 2019-05-02 ENCOUNTER — Telehealth: Payer: Self-pay | Admitting: Medical

## 2019-05-02 NOTE — Telephone Encounter (Signed)
Pt called and said he went to his kidney specialist but is still having groin pain and wants to see if he can be referred somewhere else

## 2019-05-02 NOTE — Telephone Encounter (Signed)
Please get a little more information about his symptoms.  Any urinary problems, any bowel problems, any worse back pain or pain down his legs?  Any testicle swelling, testicle pain?  It is not really clear what he is experiencing.  He already sees an orthopedist.  So the question is is this a neurological issue related to his lumbar spine or is this a urological issue related to the bladder and urinary tract?  Let me know

## 2019-05-03 ENCOUNTER — Emergency Department (HOSPITAL_COMMUNITY): Payer: BC Managed Care – PPO

## 2019-05-03 ENCOUNTER — Emergency Department (HOSPITAL_COMMUNITY)
Admission: EM | Admit: 2019-05-03 | Discharge: 2019-05-04 | Disposition: A | Discharge status: Home / Self Care | Payer: BC Managed Care – PPO | Attending: Emergency Medicine | Admitting: Emergency Medicine

## 2019-05-03 ENCOUNTER — Other Ambulatory Visit: Payer: Self-pay | Admitting: Medical

## 2019-05-03 DIAGNOSIS — Z87442 Personal history of urinary calculi: Secondary | ICD-10-CM | POA: Diagnosis not present

## 2019-05-03 DIAGNOSIS — Z87891 Personal history of nicotine dependence: Secondary | ICD-10-CM | POA: Diagnosis not present

## 2019-05-03 DIAGNOSIS — N50812 Left testicular pain: Secondary | ICD-10-CM | POA: Insufficient documentation

## 2019-05-03 DIAGNOSIS — N50811 Right testicular pain: Secondary | ICD-10-CM | POA: Diagnosis not present

## 2019-05-03 DIAGNOSIS — I10 Essential (primary) hypertension: Secondary | ICD-10-CM | POA: Diagnosis not present

## 2019-05-03 LAB — COMPREHENSIVE METABOLIC PANEL
ALT: 26 U/L (ref 0–44)
AST: 23 U/L (ref 15–41)
Albumin: 4.3 g/dL (ref 3.5–5.0)
Alkaline Phosphatase: 60 U/L (ref 38–126)
Anion gap: 10 (ref 5–15)
BUN: 15 mg/dL (ref 6–20)
CO2: 26 mmol/L (ref 22–32)
Calcium: 9.7 mg/dL (ref 8.9–10.3)
Chloride: 102 mmol/L (ref 98–111)
Creatinine, Ser: 1.47 mg/dL — ABNORMAL HIGH (ref 0.61–1.24)
GFR calc Af Amer: >60 mL/min (ref >60)
GFR calc non Af Amer: 57 mL/min — ABNORMAL LOW (ref >60)
Glucose, Bld: 108 mg/dL — ABNORMAL HIGH (ref 70–99)
Potassium: 4.2 mmol/L (ref 3.5–5.1)
Sodium: 138 mmol/L (ref 135–145)
Total Bilirubin: 0.5 mg/dL (ref 0.3–1.2)
Total Protein: 7.5 g/dL (ref 6.5–8.1)

## 2019-05-03 LAB — URINALYSIS, ROUTINE W REFLEX MICROSCOPIC
Bilirubin Urine: NEGATIVE
Glucose, UA: NEGATIVE mg/dL
Hgb urine dipstick: NEGATIVE
Ketones, ur: NEGATIVE mg/dL
Leukocytes,Ua: NEGATIVE
Nitrite: NEGATIVE
Protein, ur: NEGATIVE mg/dL
Specific Gravity, Urine: 1.021 (ref 1.005–1.030)
pH: 7 (ref 5.0–8.0)

## 2019-05-03 LAB — CBC
HCT: 44.6 % (ref 39.0–52.0)
Hemoglobin: 15.5 g/dL (ref 13.0–17.0)
MCH: 30.1 pg (ref 26.0–34.0)
MCHC: 34.8 g/dL (ref 30.0–36.0)
MCV: 86.6 fL (ref 80.0–100.0)
Platelets: 303 10*3/uL (ref 150–400)
RBC: 5.15 MIL/uL (ref 4.22–5.81)
RDW: 11.6 % (ref 11.5–15.5)
WBC: 5.5 10*3/uL (ref 4.0–10.5)
nRBC: 0 % (ref 0.0–0.2)

## 2019-05-03 LAB — LIPASE, BLOOD: Lipase: 36 U/L (ref 11–51)

## 2019-05-03 MED ORDER — CEFTRIAXONE SODIUM 500 MG IJ SOLR
500.0000 mg | Freq: Once | INTRAMUSCULAR | Status: AC
  Administered 2019-05-03: 500 mg via INTRAMUSCULAR
  Filled 2019-05-03: qty 500

## 2019-05-03 MED ORDER — DOXYCYCLINE HYCLATE 100 MG PO CAPS: 100.0000 mg | ORAL_CAPSULE | Freq: Two times a day (BID) | ORAL | 0 refills | Status: AC

## 2019-05-03 MED ORDER — SULFAMETHOXAZOLE-TRIMETHOPRIM 800-160 MG PO TABS: 1.0000 | ORAL_TABLET | Freq: Two times a day (BID) | ORAL | 0 refills | Status: DC

## 2019-05-03 MED ORDER — DOXYCYCLINE HYCLATE 100 MG PO TABS
100.0000 mg | ORAL_TABLET | Freq: Once | ORAL | Status: AC
  Administered 2019-05-03: 100 mg via ORAL
  Filled 2019-05-03: qty 1

## 2019-05-03 MED ORDER — SODIUM CHLORIDE 0.9% FLUSH: 3.0000 mL | Freq: Once | INTRAVENOUS | Status: DC

## 2019-05-03 NOTE — Telephone Encounter (Signed)
Have him begin antibiotic Bactrim twice a day for 7 to 14 days, hydrate well with water, wear briefs that lifts up on the testicles, and if not much better within 2 weeks will refer to urology

## 2019-05-03 NOTE — Telephone Encounter (Signed)
Patient stated he feels like someone is squeezing his testicles. Please advise further

## 2019-05-03 NOTE — ED Provider Notes (Signed)
[facility] Provider Note   CSN: [id number] Arrival date & time: 05/03/19  1725     History Chief Complaint  Patient presents with  . Testicle Pain    [name] is a 44 y.o. male possible history of asthma, history of kidney stones, hypertension, chronic scrotal pain who presents for evaluation of left groin pain.  He states that this is been intermittently occurring for about 1 year.  He has been seen by urology for this with no conclusive finding as to what is causing his symptoms.  He states that his pain is usually intermittent but has become more severe and more constant.  He has also been seen by pain management for the symptoms.  He states that normally his pain is worse at night when he is laying down.  He states he had some relief when he was getting up and walking but now he does not feel like that is so.  He has not noticed any overlying warmth or erythema, swelling.  He has not palpated any masses.  He denies any penile discharge, penile swelling.  He denies any dysuria, hematuria.  He states he has had some intermittent abdominal pain but denies any abdominal pain now.  He saw urology on 04/30/2019 and stated that there was no abnormalities.  He denies any fevers, nausea/vomiting.  He is currently sexually active with 1 person.  He does not have any concerns for STDs at this time.  The history is provided by the patient.       Past Medical History:  Diagnosis Date  . Asthma    last asthma attack last year in 2012  . Hearing impairment    wears hearing aids  . History of kidney stones   . Hypertension    off bp meds  last 2 years   . Renal stone   . Sarcoidosis   . Wears glasses     Patient Active Problem List   Diagnosis Date Noted  . History of kidney stones 04/04/2019  . Family history of heart disease 04/04/2019  . Pain in both lower extremities 12/06/2018  . Radicular pain of lower extremity 12/06/2018  . Decreased  pedal pulses 12/06/2018  . History of back surgery 12/06/2018  . Blood in stool 09/01/2018  . Rectal burning 09/01/2018  . Testicular discomfort 09/01/2018  . Secondary hypertension 09/01/2018  . Allergic rhinitis due to pollen 07/04/2018  . Heartburn 11/17/2017  . Screening for lipid disorders 11/17/2017  . Influenza vaccination declined 11/17/2017  . Paresthesia 09/30/2017  . Noncompliance 09/30/2017  . Essential hypertension, benign 04/07/2017  . Proteinuria 04/07/2017  . Impaired renal function 04/07/2017  . Cervical radiculopathy 04/07/2017  . Headache syndrome 04/07/2017  . Moderate persistent asthma without complication 90/24/0973  . History of sarcoidosis 03/22/2017  . Frequent headaches 03/22/2017  . Nasal sinus congestion 03/22/2017  . Staghorn kidney stones 01/30/2015  . Nephrolithiasis   . Right ureteral stone 01/20/2015  . Hydronephrosis of right kidney 01/20/2015  . Hydronephrosis 01/20/2015  . Sarcoid 10/19/2010    Past Surgical History:  Procedure Laterality Date  . CYSTOSCOPY W/ RETROGRADES Right 08/20/2015   Procedure: CYSTOSCOPY WITH RETROGRADE PYELOGRAM;  Surgeon: Alexis Frock, MD;  Location: [facility];  Service: Urology;  Laterality: Right;  . CYSTOSCOPY WITH URETEROSCOPY AND STENT PLACEMENT Right 08/20/2015   Procedure: CYSTOSCOPY WITH URETEROSCOPY AND STENT PLACEMENT;  Surgeon: Alexis Frock, MD;  Location: [facility];  Service: Urology;  Laterality: Right;  . CYSTOSCOPY/RETROGRADE/URETEROSCOPY  01/12/2012   Procedure: CYSTOSCOPY/RETROGRADE/URETEROSCOPY;  Surgeon: Sebastian Ache, MD;  Location: [facility];  Service: Urology;  Laterality: Right;  . CYSTOSCOPY/URETEROSCOPY/HOLMIUM LASER/STENT PLACEMENT Left 06/16/2016   Procedure: STAGE ONE-CYSTOSCOPY/DIAGNOSTIC URETEROSCOPY/RETROGRADE/STENT PLACEMENT;  Surgeon: Sebastian Ache, MD;  Location: [facility];  Service: Urology;  Laterality: Left;  . CYSTOSCOPY/URETEROSCOPY/HOLMIUM  LASER/STENT PLACEMENT Left 06/30/2016   Procedure: STAGE TWO-CYSTOSCOPY/URETEROSCOPY/HOLMIUM LASER/STENT EXCHANGE;  Surgeon: Sebastian Ache, MD;  Location: [facility];  Service: Urology;  Laterality: Left;  . ESOPHAGEAL DILATION    . HOLMIUM LASER APPLICATION Right 08/20/2015   Procedure: HOLMIUM LASER APPLICATION;  Surgeon: Sebastian Ache, MD;  Location: [facility];  Service: Urology;  Laterality: Right;  . NEPHROLITHOTOMY  02/11/2012   Procedure: NEPHROLITHOTOMY PERCUTANEOUS;  Surgeon: Sebastian Ache, MD;  Location: [facility];  Service: Urology;  Laterality: Right;  . NEPHROLITHOTOMY Right 01/30/2015   Procedure: NEPHROLITHOTOMY PERCUTANEOUS, NEPHROSTOGRAM;  Surgeon: Sebastian Ache, MD;  Location: [facility];  Service: Urology;  Laterality: Right;  . STONE EXTRACTION WITH BASKET Right 08/20/2015   Procedure: STONE EXTRACTION WITH BASKET;  Surgeon: Sebastian Ache, MD;  Location: [facility];  Service: Urology;  Laterality: Right;  . URETEROSCOPY  01/30/2015   Procedure: URETEROSCOPY WITH BASKETING;  Surgeon: Sebastian Ache, MD;  Location: [facility];  Service: Urology;;       Family History  Problem Relation Age of Onset  . Hypertension Mother   . Asthma Mother   . Diabetes Father   . Heart attack Father   . Stroke Father   . Liver disease Sister   . Cancer Paternal Grandmother   . Stomach cancer Neg Hx   . Colon cancer Neg Hx   . Pancreatic cancer Neg Hx   . Esophageal cancer Neg Hx     Social History   Tobacco Use  . Smoking status: Former Smoker    Packs/day: 0.20    Years: 2.00    Pack years: 0.40    Types: Cigarettes    Quit date: 04/06/2007    Years since quitting: 12.0  . Smokeless tobacco: Former Neurosurgeon    Types: Snuff    Quit date: 04/05/2013  Substance Use Topics  . Alcohol use: No  . Drug use: No    Home Medications Prior to Admission medications   Medication Sig Start Date End Date Taking? Authorizing Provider    amLODIPine-Valsartan-HCTZ 5-160-12.5 MG TABS Take 1 tablet by mouth daily. 04/04/19  Yes Tysinger, Kermit Balo, PA-C  doxycycline (VIBRAMYCIN) 100 MG capsule Take 1 capsule (100 mg total) by mouth 2 (two) times daily for 7 days. 05/03/19 05/10/19  Maxwell Caul, PA-C  levocetirizine (XYZAL) 5 MG tablet TAKE 1 TABLET BY MOUTH EVERY DAY IN THE EVENING Patient not taking: Reported on 03/19/2019 12/28/18   Tysinger, Kermit Balo, PA-C  omeprazole (PRILOSEC) 40 MG capsule TAKE 1 CAPSULE BY MOUTH EVERY DAY Patient not taking: Reported on 05/03/2019 04/10/19   Tysinger, Kermit Balo, PA-C  sulfamethoxazole-trimethoprim (BACTRIM DS) 800-160 MG tablet Take 1 tablet by mouth 2 (two) times daily. Patient not taking: Reported on 05/03/2019 05/03/19   Jac Canavan, PA-C  SYMBICORT 160-4.5 MCG/ACT inhaler TAKE 2 PUFFS BY MOUTH TWICE A DAY Patient not taking: Reported on 03/19/2019 01/18/19   Jac Canavan, PA-C  valsartan (DIOVAN) 80 MG tablet TAKE 1 TABLET BY MOUTH EVERY DAY Patient not taking: Reported on 05/03/2019 03/20/19   Tysinger, Kermit Balo, PA-C  VENTOLIN HFA 108 (90 Base) MCG/ACT  inhaler TAKE 2 PUFFS BY MOUTH EVERY 6 HOURS AS NEEDED FOR WHEEZE OR SHORTNESS OF BREATH Patient not taking: Reported on 03/19/2019 07/17/18   Tysinger, Kermit Balo, PA-C    Allergies    Patient has no known allergies.  Review of Systems   Review of Systems  Constitutional: Negative for fever.  Cardiovascular: Negative for chest pain.  Gastrointestinal: Negative for abdominal pain, nausea and vomiting.  Genitourinary: Positive for scrotal swelling. Negative for dysuria and hematuria.  Neurological: Negative for headaches.  All other systems reviewed and are negative.   Physical Exam Updated Vital Signs BP (!) 132/96 (BP Location: Right Arm)   Pulse 80   Temp 97.8 F (36.6 C) (Oral)   Resp 18   SpO2 98%   Physical Exam Vitals and nursing note reviewed. Exam conducted with a chaperone present.  Constitutional:       Appearance: Normal appearance. He is well-developed.     Comments: Sitting comfortably on examination table  HENT:     Head: Normocephalic and atraumatic.  Eyes:     General: Lids are normal.     Conjunctiva/sclera: Conjunctivae normal.     Pupils: Pupils are equal, round, and reactive to light.  Cardiovascular:     Rate and Rhythm: Normal rate and regular rhythm.     Pulses: Normal pulses.     Heart sounds: Normal heart sounds. No murmur. No friction rub. No gallop.   Pulmonary:     Effort: Pulmonary effort is normal.     Breath sounds: Normal breath sounds.  Abdominal:     Palpations: Abdomen is soft. Abdomen is not rigid.     Tenderness: There is no abdominal tenderness. There is no guarding.  Genitourinary:    Penis: Normal.      Testes:        Right: Mass or tenderness not present. Cremasteric reflex is present.         Left: Tenderness present. Mass not present. Cremasteric reflex is present.      Comments: The exam was performed with a chaperone present. Normal male genitalia. No evidence of rash, ulcers or lesions.  Tenderness palpation noted to left testicle.  No overlying warmth, erythema, edema.  No palpable mass.  No hernia noted bilaterally.  No tenderness palpation noted penis.  No discharge noted.  Normal cremasteric reflex bilaterally. Musculoskeletal:        General: Normal range of motion.     Cervical back: Full passive range of motion without pain.  Skin:    General: Skin is warm and dry.     Capillary Refill: Capillary refill takes less than 2 seconds.  Neurological:     Mental Status: He is alert and oriented to person, place, and time.  Psychiatric:        Speech: Speech normal.     ED Results / Procedures / Treatments   Labs (all labs ordered are listed, but only abnormal results are displayed) Labs Reviewed  COMPREHENSIVE METABOLIC PANEL - Abnormal; Notable for the following components:      Result Value   Glucose, Bld 108 (*)    Creatinine, Ser  1.47 (*)    GFR calc non Af Amer 57 (*)    All other components within normal limits  LIPASE, BLOOD  CBC  URINALYSIS, ROUTINE W REFLEX MICROSCOPIC  GC/CHLAMYDIA PROBE AMP ([place]) NOT AT [facility]    EKG None  Radiology US SCROTUM W/DOPPLER  Result Date: 05/03/2019 CLINICAL DATA:  44 year old male with  scrotal, left testicular pain for 2 days. EXAM: SCROTAL ULTRASOUND DOPPLER ULTRASOUND OF THE TESTICLES TECHNIQUE: Complete ultrasound examination of the testicles, epididymis, and other scrotal structures was performed. Color and spectral Doppler ultrasound were also utilized to evaluate blood flow to the testicles. COMPARISON:  CT Abdomen and Pelvis 05/05/2018 FINDINGS: Right testicle Measurements: 4.5 x 3.0 x 3.1 centimeters. No mass or microlithiasis visualized. However, there are a few scattered small 3-4 millimeter circumscribed hypoechoic areas noted (e.g. Image 19). No associated hypervascularity (image 26). Left testicle Measurements:  4.5 x 2.0 x 0.5 centimeters. Left testicular echogenicity appears asymmetrically heterogeneous (such as on images 53 and 54) and there is a larger 5 millimeter rounded hypoechoic area in the underlying heterogeneous testicular pole (image 65). No hypervascularity (image 66), and there is definite power Doppler vascular flow within the testis (image 62). Right epididymis: Normal in size and appearance. No hypervascularity. Left epididymis:  Normal in size and appearance. Hydrocele:  Trace bilateral hydroceles appearing simple. Varicocele:  None visualized. Pulsed Doppler interrogation of both testes demonstrates normal low resistance arterial and venous waveforms bilaterally. IMPRESSION: Preserved bilateral testicular vascularity but heterogeneous testicular echotexture, especially on the left. No associated testis or epididymis hypervascularity to suggest acute infection or inflammation. As such an infiltrative or neoplastic testicular process is difficult to  exclude. Recommend Urology consultation. Electronically Signed   By: Odessa FlemingH  Hall M.D.   On: 05/03/2019 20:35    Procedures Procedures (including critical care time)  Medications Ordered in ED Medications  sodium chloride flush (NS) 0.9 % injection 3 mL (3 mLs Intravenous Not Given 05/03/19 2248)  cefTRIAXone (ROCEPHIN) injection 500 mg (500 mg Intramuscular Given 05/03/19 2252)  doxycycline (VIBRA-TABS) tablet 100 mg (100 mg Oral Given 05/03/19 2252)    ED Course  I have reviewed the triage vital signs and the nursing notes.  Pertinent labs & imaging results that were available during my care of the patient were reviewed by me and considered in my medical decision making (see chart for details).    MDM Rules/Calculators/A&P                      44 year old male who presents for evaluation of left scrotal pain x1 year.  Feels like it has been more constant more severe.  Has been seen by neurology without any abnormalities.  No penile discharge, dysuria, hematuria, fevers.  Initially arrival, he is afebrile, nontoxic-appearing.  Vital signs are stable.  No abdominal tenderness on my exam.  GU exam shows tenderness palpation of the left testicle.  No overlying warmth, erythema, swelling.  Low suspicion for testicular torsion.  Doubt acute epididymitis, orchitis.  Did discuss with patient regarding his sexual history.  He does not have any concern for STD but does want to be tested today.  Plan to check labs, urine.  Given tenderness, will plan for ultrasound.  CMP shows BUN of 15, creatinine of 1.47. This is baseline.  Lipase is unremarkable.  CBC shows no leukocytosis or anemia.  Urine negative for any infectious etiology or hemoglobin.  Ultrasound shows no evidence of torsion.  There is a heterogenous testicular echotexture, especially on the left.  They state that there is could be infiltrative versus neoplastic.  Unfortunately do not have access to his previous ultrasound that were done during  his office visit this week.  I do not know if this is a new finding or something that was seen previously.  Will consult urology.  Dr. Clarene DukeLittle talk  to Dr. Ronne Binning who recommended treating as epididymitis and having him follow-up in clinic in the office.  I discussed results with patient.  Patient with no known drug allergies.  We will plan to treat him accordingly.  I discussed with him that he will need to contact Dr. Chales Abrahams office to arrange outpatient follow-up. At this time, patient exhibits no emergent life-threatening condition that require further evaluation in ED or admission. Patient had ample opportunity for questions and discussion. All patient's questions were answered with full understanding. Strict return precautions discussed. Patient expresses understanding and agreement to plan.   Portions of this note were generated with Scientist, clinical (histocompatibility and immunogenetics). Dictation errors may occur despite best attempts at proofreading.  Final Clinical Impression(s) / ED Diagnoses Final diagnoses:  Pain in left testicle    Rx / DC Orders ED Discharge Orders         Ordered    doxycycline (VIBRAMYCIN) 100 MG capsule  2 times daily     05/03/19 2304           Maxwell Caul, PA-C 05/03/19 2315    Little, Ambrose [place], MD 05/04/19 1710

## 2019-05-03 NOTE — ED Notes (Signed)
Patient transported to Ultrasound 

## 2019-05-03 NOTE — Discharge Instructions (Signed)
As we discussed your work-up today was reassuring.  Your ultrasound showed a slight abnormality that could be an infection.  We have treated you with antibiotics which she will continue to take.  You need to follow-up with urologist to see if this resolves the issue.  If not, they will discuss further if this needs any other action.  Take antibiotics as directed.  You can take 1000 mg of Tylenol.  Do not exceed 4000 mg of Tylenol a day.  Return the emergency department for any worsening pain, blood in your urine, abdominal pain, fevers.

## 2019-05-03 NOTE — ED Triage Notes (Signed)
Pt here from home with c/o groin pain , pt was seen by his kidney MD Monday and was told everything looked good

## 2019-05-04 NOTE — Telephone Encounter (Signed)
Tried to call patient. No answer, sent message on mychart

## 2019-05-05 ENCOUNTER — Other Ambulatory Visit: Payer: Self-pay | Admitting: Medical

## 2019-05-07 LAB — GC/CHLAMYDIA PROBE AMP (~~LOC~~) NOT AT ARMC
Chlamydia: NEGATIVE
Neisseria Gonorrhea: NEGATIVE

## 2019-05-08 DIAGNOSIS — R338 Other retention of urine: Secondary | ICD-10-CM | POA: Diagnosis not present

## 2019-05-08 DIAGNOSIS — N5082 Scrotal pain: Secondary | ICD-10-CM | POA: Diagnosis not present

## 2019-05-08 DIAGNOSIS — N202 Calculus of kidney with calculus of ureter: Secondary | ICD-10-CM | POA: Diagnosis not present

## 2019-05-15 DIAGNOSIS — M542 Cervicalgia: Secondary | ICD-10-CM | POA: Diagnosis not present

## 2019-05-15 DIAGNOSIS — M5126 Other intervertebral disc displacement, lumbar region: Secondary | ICD-10-CM | POA: Diagnosis not present

## 2019-05-15 DIAGNOSIS — F1120 Opioid dependence, uncomplicated: Secondary | ICD-10-CM | POA: Diagnosis not present

## 2019-05-15 DIAGNOSIS — Z79899 Other long term (current) drug therapy: Secondary | ICD-10-CM | POA: Diagnosis not present

## 2019-05-18 DIAGNOSIS — M545 Low back pain: Secondary | ICD-10-CM | POA: Diagnosis not present

## 2019-05-18 DIAGNOSIS — M47816 Spondylosis without myelopathy or radiculopathy, lumbar region: Secondary | ICD-10-CM | POA: Diagnosis not present

## 2019-05-18 DIAGNOSIS — M549 Dorsalgia, unspecified: Secondary | ICD-10-CM | POA: Diagnosis not present

## 2019-05-18 DIAGNOSIS — M961 Postlaminectomy syndrome, not elsewhere classified: Secondary | ICD-10-CM | POA: Diagnosis not present

## 2019-05-31 DIAGNOSIS — M542 Cervicalgia: Secondary | ICD-10-CM | POA: Diagnosis not present

## 2019-05-31 DIAGNOSIS — M961 Postlaminectomy syndrome, not elsewhere classified: Secondary | ICD-10-CM | POA: Diagnosis not present

## 2019-05-31 DIAGNOSIS — M47816 Spondylosis without myelopathy or radiculopathy, lumbar region: Secondary | ICD-10-CM | POA: Diagnosis not present

## 2019-06-12 DIAGNOSIS — M7918 Myalgia, other site: Secondary | ICD-10-CM | POA: Diagnosis not present

## 2019-06-12 DIAGNOSIS — M961 Postlaminectomy syndrome, not elsewhere classified: Secondary | ICD-10-CM | POA: Diagnosis not present

## 2019-06-12 DIAGNOSIS — M5416 Radiculopathy, lumbar region: Secondary | ICD-10-CM | POA: Diagnosis not present

## 2019-06-12 DIAGNOSIS — M542 Cervicalgia: Secondary | ICD-10-CM | POA: Diagnosis not present

## 2019-06-18 DIAGNOSIS — M47816 Spondylosis without myelopathy or radiculopathy, lumbar region: Secondary | ICD-10-CM | POA: Diagnosis not present

## 2019-06-26 ENCOUNTER — Ambulatory Visit: Payer: BC Managed Care – PPO | Admitting: Medical

## 2019-06-26 ENCOUNTER — Other Ambulatory Visit: Payer: Self-pay

## 2019-06-26 ENCOUNTER — Encounter: Payer: Self-pay | Admitting: Medical

## 2019-06-26 VITALS — BP 130/90 | HR 73 | Temp 98.1°F | Ht 72.0 in | Wt 197.6 lb

## 2019-06-26 DIAGNOSIS — R300 Dysuria: Secondary | ICD-10-CM

## 2019-06-26 DIAGNOSIS — R39198 Other difficulties with micturition: Secondary | ICD-10-CM | POA: Diagnosis not present

## 2019-06-26 DIAGNOSIS — N182 Chronic kidney disease, stage 2 (mild): Secondary | ICD-10-CM

## 2019-06-26 DIAGNOSIS — Z125 Encounter for screening for malignant neoplasm of prostate: Secondary | ICD-10-CM

## 2019-06-26 DIAGNOSIS — M5410 Radiculopathy, site unspecified: Secondary | ICD-10-CM

## 2019-06-26 DIAGNOSIS — I10 Essential (primary) hypertension: Secondary | ICD-10-CM

## 2019-06-26 DIAGNOSIS — R7301 Impaired fasting glucose: Secondary | ICD-10-CM

## 2019-06-26 DIAGNOSIS — N529 Male erectile dysfunction, unspecified: Secondary | ICD-10-CM

## 2019-06-26 DIAGNOSIS — M5442 Lumbago with sciatica, left side: Secondary | ICD-10-CM

## 2019-06-26 DIAGNOSIS — G8929 Other chronic pain: Secondary | ICD-10-CM

## 2019-06-26 DIAGNOSIS — Z862 Personal history of diseases of the blood and blood-forming organs and certain disorders involving the immune mechanism: Secondary | ICD-10-CM

## 2019-06-26 LAB — POCT URINALYSIS DIP (PROADVANTAGE DEVICE)
Bilirubin, UA: NEGATIVE
Blood, UA: NEGATIVE
Glucose, UA: NEGATIVE mg/dL
Ketones, POC UA: NEGATIVE mg/dL
Leukocytes, UA: NEGATIVE
Nitrite, UA: NEGATIVE
Protein Ur, POC: NEGATIVE mg/dL
Specific Gravity, Urine: 1.025
Urobilinogen, Ur: NEGATIVE
pH, UA: 6 (ref 5.0–8.0)

## 2019-06-26 LAB — POCT CBG (FASTING - GLUCOSE)-MANUAL ENTRY: Glucose Fasting, POC: 117 mg/dL — AB (ref 70–99)

## 2019-06-26 MED ORDER — SILDENAFIL CITRATE 100 MG PO TABS: 50.0000 mg | ORAL_TABLET | Freq: Every day | ORAL | 0 refills | Status: DC | PRN

## 2019-06-26 MED ORDER — AMLODIPINE-VALSARTAN-HCTZ 10-160-12.5 MG PO TABS: 1.0000 | ORAL_TABLET | Freq: Every day | ORAL | 2 refills | Status: DC

## 2019-06-26 NOTE — Patient Instructions (Signed)
Recommendations High blood pressure  Your blood pressure is not at goal but improving  I want to increase to higher dose of current medicine Amlodipine/Valsartan/HCT 10/160/12.5 mg daily  Please limit or avoid added salt in your diet  Exercise regular walking or other exercise such as jogging, bicycling, other  Check your blood pressure at home at least twice per week.  The goal is less than 130/80  Your blood pressure today was 130/90  You have an abnormal kidney marker called creatinine.  You have stage II chronic kidney disease  This is likely due to chronic high blood pressure  We will make changes to your blood pressure medicine today  You also have a history of kidney stones which could also impact your kidney marker from prior damage due to swelling of the kidney from kidney stone  Erectile dysfunction  Your new symptoms could be related to your lumbar spine issues and chronic back pain given that your symptoms worsen or started within the last 3 months when you have had a flareup with your back and buttock pain  Begin trial of low-dose Viagra.  Take 1 tablet about 30 minutes prior to intercourse.  Do not take more than 1 tablet in a 24-hour..  These medicines cannot be used in conjunction with nitrates or nitroglycerin due to potentially fatal reaction  Your blood sugar is elevated today putting you at risk for diabetes  I am including a low-carb diet and exercise guide with your handout today    Exercise: I recommend exercising most days of the week using a type of exercise that you would enjoy and stick to such as walking, running, swimming, hiking, biking, aerobics, etc. This needs to be at least 30-40 minutes at a time, at least 5 days/week with moderate intensity.  This would be 60-70% of your maximum heart rate.  For example, your maximal heart rate would be 200- your age, multiplied x 0.6 to equal 60% of your maximal heart rate.    Thus, a person age 49 would  have a maximum heart rate of 160.  60% of this would be 96 beats per minute.  Thus for fat burning exercise, a 46 year old would want to exercise has sustained heart rate of about 96 bpm for fat burning.  However for heart health, they would want to exercise at a higher heart rate of about 75% of maximum heart rate which would be a heart rate of about 120 beats per minute.  So I recommend a combination of doing some exercise at moderate intensity, and some exercise at vigorous intensity for heart health.   Low Carb Diet recommendations:  I recommend you drink water throughout the day.   70 ounces or 2 liters would be a good amount.  If you have been accustomed to drinking juice or soda, try water with lemon or water with lime, or try using no calorie flavor dropper.  I recommend the following as an example meal plan that includes 3 meals per day.   You can skip some meals periodically for intermittent fasting.  Breakfast (choose one): Mar[employer] Omelette with egg, can include a little bit of cheese, your choice of mushrooms, peppers, onions, salsa . Smoothie with handful of spinach or kale, 1 cup of milk or water, 1 cup of berries, 1 packet of artificial sweetener such as stevia . Yogurt with fruit   Mid morning snack: . 1 fruit serving and 1 protein such as 8 almonds or 8 nuts, or vegetable  such as carrots and humus or other similar vegetable   Lunch: . Salad with 3-4 ounces of lean grilled or baked meat such as fish, chicken or [place]   Mid afternoon snack: . 1 fruit serving and 1 protein such as 8 almonds or 8 nuts, or vegetable such as carrots and humus or other similar vegetable   Dinner: . Large serving of vegetables and 3-4 ounces of lean grilled or baked meat such as fish, chicken or [place] . Or vegetarian dish without meat    Avoid  . Chips, cookies, cake, donuts, soda, sweet tea, juices, candy, fast food . For now , avoid, or significantly limit grains

## 2019-06-26 NOTE — Progress Notes (Signed)
Subjective: Chief Complaint  Patient presents with  . Nocturia    urine has a sweet smell    Here for unusual odor, sweet odor in urine and some dysuria.  Loves sweets, eats sweets all the time.  No polydipsia, no polyuria.   No penile discharge. He does have some discomfort with urination.  No blood in urine or cloudy urine.    He has a history of chronic back pain.  He sees a back specialist.  He has had some recent steroid injections in his lumbar spine.  He does get buttock pain down the left and some low back pain.  About 3 months ago he started having some problems with erections.  He can get an erection but cannot keep erection.  This is happening just about every time with sexual activity.  He wants to try something like Viagra.  No prior similar medicine.  This also has been going on the same time his buttock pain has flared up.  He denies numbness in the genitals, no incontinence  He has history of kidney stones.  He saw urology within the past month and they felt like things were stable and no issues currently.  Hypertension-Compliant with BP medication.   Home BPs range from normal to elevated.     No other aggravating or relieving factors. No other complaint.  Past Medical History:  Diagnosis Date  . Asthma    last asthma attack last year in 2012  . Hearing impairment    wears hearing aids  . History of kidney stones   . Hypertension    off bp meds  last 2 years   . Renal stone   . Sarcoidosis   . Wears glasses    Current Outpatient Medications on File Prior to Visit  Medication Sig Dispense Refill  . VENTOLIN HFA 108 (90 Base) MCG/ACT inhaler TAKE 2 PUFFS BY MOUTH EVERY 6 HOURS AS NEEDED FOR WHEEZE OR SHORTNESS OF BREATH (Patient not taking: Reported on 03/19/2019) 18 Inhaler 0   No current facility-administered medications on file prior to visit.     Objective: BP 130/90   Pulse 73   Temp 98.1 F (36.7 C)   Ht 6' (1.829 m)   Wt 197 lb 9.6 oz (89.6 kg)    SpO2 98%   BMI 26.80 kg/m   Wt Readings from Last 3 Encounters:  06/26/19 197 lb 9.6 oz (89.6 kg)  04/04/19 202 lb 6.4 oz (91.8 kg)  03/19/19 188 lb (85.3 kg)   BP Readings from Last 3 Encounters:  06/26/19 130/90  05/03/19 (!) 133/99  04/04/19 (!) 168/98   General appearence: alert, no distress, WD/WN, lean African American male heart: RRR, normal S1, S2, no murmurs Lungs: CTA bilaterally, no wheezes, rhonchi, or rales Abdomen: +bs, soft, non tender, non distended, no masses, no hepatomegaly, no splenomegaly, no bruits Musculoskeletal: legs nontender, no swelling, no obvious deformity Extremities: no edema, no cyanosis, no clubbing Pulses: 2+ symmetric, upper and lower extremities, normal cap refill Neurological: alert, oriented x 3, CN2-12 intact, strength normal upper extremities and lower extremities, sensation normal throughout, DTRs 2+ throughout, no cerebellar signs, gait normal Psychiatric: normal affect, behavior normal, pleasant  GU normal male, circ, no mass, no swelling, no lymphadenopathy    Assessment: Encounter Diagnoses  Name Primary?  . Dysuria Yes  . Impaired fasting glucose   . Decreased urine stream   . Screening for prostate cancer   . CKD (chronic kidney disease) stage 2, GFR 60-89  ml/min   . Essential hypertension, benign   . History of sarcoidosis   . Erectile dysfunction, unspecified erectile dysfunction type   . Chronic left-sided low back pain with left-sided sciatica   . Radicular pain of left lower extremity      Plan: Discussed his symptoms and concerns.  Labs as below for further evaluation of the urine findings.  He does has history of kidney stones, prior hydronephrosis, uncontrolled high blood pressure but improving with recent medication changes  He sees back specialist for chronic back pain and radicular symptoms.  We discussed the following recommendations  Recommendations High blood pressure  Your blood pressure is not at  goal but improving  I want to increase to higher dose of current medicine Amlodipine/Valsartan/HCT 10/160/12.5 mg daily  Please limit or avoid added salt in your diet  Exercise regular walking or other exercise such as jogging, bicycling, other  Check your blood pressure at home at least twice per week.  The goal is less than 130/80  Your blood pressure today was 130/90  You have an abnormal kidney marker called creatinine.  You have stage II chronic kidney disease  This is likely due to chronic high blood pressure  We will make changes to your blood pressure medicine today  You also have a history of kidney stones which could also impact your kidney marker from prior damage due to swelling of the kidney from kidney stone  Erectile dysfunction  Your new symptoms could be related to your lumbar spine issues and chronic back pain given that your symptoms worsen or started within the last 3 months when you have had a flareup with your back and buttock pain  Begin trial of low-dose Viagra.  Take 1 tablet about 30 minutes prior to intercourse.  Do not take more than 1 tablet in a 24-hour..  These medicines cannot be used in conjunction with nitrates or nitroglycerin due to potentially fatal reaction  Your blood sugar is elevated today putting you at risk for diabetes  I am including a low-carb diet and exercise guide with your handout today    Bartolo was seen today for nocturia.  Diagnoses and all orders for this visit:  Dysuria -     POCT Urinalysis DIP (Proadvantage Device) -     Glucose (CBG), Fasting -     PSA -     Urine Culture -     Chlamydia/Gonococcus/Trichomonas, NAA  Impaired fasting glucose -     POCT Urinalysis DIP (Proadvantage Device) -     Glucose (CBG), Fasting  Decreased urine stream -     PSA  Screening for prostate cancer -     PSA  CKD (chronic kidney disease) stage 2, GFR 60-89 ml/min  Essential hypertension, benign  History of  sarcoidosis  Erectile dysfunction, unspecified erectile dysfunction type  Chronic left-sided low back pain with left-sided sciatica  Radicular pain of left lower extremity  Other orders -     amLODIPine-Valsartan-HCTZ 10-160-12.5 MG TABS; Take 1 tablet by mouth daily. -     sildenafil (VIAGRA) 100 MG tablet; Take 0.5-1 tablets (50-100 mg total) by mouth daily as needed for erectile dysfunction.

## 2019-06-27 LAB — PSA: Prostate Specific Ag, Serum: 1.3 ng/mL (ref 0.0–4.0)

## 2019-06-27 LAB — URINE CULTURE: Organism ID, Bacteria: NO GROWTH

## 2019-06-28 ENCOUNTER — Other Ambulatory Visit: Payer: Self-pay | Admitting: Medical

## 2019-06-29 ENCOUNTER — Telehealth: Payer: Self-pay

## 2019-06-29 LAB — CHLAMYDIA/GONOCOCCUS/TRICHOMONAS, NAA
Chlamydia by NAA: NEGATIVE
Gonococcus by NAA: NEGATIVE
Trich vag by NAA: NEGATIVE

## 2019-06-29 NOTE — Telephone Encounter (Signed)
Pt. Called back for his lab results said he was at work and did not have access to my chart their, so I made him aware of his lab results and tried to schedule him for a f/u in 1 month he said he will have to call back after he checks his schedule.

## 2019-08-02 DIAGNOSIS — M5440 Lumbago with sciatica, unspecified side: Secondary | ICD-10-CM | POA: Diagnosis not present

## 2019-08-14 DIAGNOSIS — M48061 Spinal stenosis, lumbar region without neurogenic claudication: Secondary | ICD-10-CM | POA: Diagnosis not present

## 2019-09-05 DIAGNOSIS — M5126 Other intervertebral disc displacement, lumbar region: Secondary | ICD-10-CM | POA: Diagnosis not present

## 2019-09-19 ENCOUNTER — Other Ambulatory Visit (HOSPITAL_COMMUNITY): Payer: Self-pay | Admitting: Urology

## 2019-09-19 ENCOUNTER — Other Ambulatory Visit: Payer: Self-pay | Admitting: Urology

## 2019-09-19 DIAGNOSIS — N5082 Scrotal pain: Secondary | ICD-10-CM

## 2019-09-20 ENCOUNTER — Encounter: Payer: Self-pay | Admitting: Psychology

## 2019-09-24 DIAGNOSIS — Z0289 Encounter for other administrative examinations: Secondary | ICD-10-CM

## 2019-09-25 ENCOUNTER — Other Ambulatory Visit: Payer: Self-pay | Admitting: Medical

## 2019-10-18 ENCOUNTER — Other Ambulatory Visit: Payer: Self-pay

## 2019-10-18 ENCOUNTER — Encounter: Payer: BC Managed Care – PPO | Attending: Psychology | Admitting: Psychology

## 2019-10-18 DIAGNOSIS — M5442 Lumbago with sciatica, left side: Secondary | ICD-10-CM | POA: Insufficient documentation

## 2019-10-18 DIAGNOSIS — M5410 Radiculopathy, site unspecified: Secondary | ICD-10-CM | POA: Diagnosis not present

## 2019-10-18 DIAGNOSIS — G8929 Other chronic pain: Secondary | ICD-10-CM | POA: Diagnosis not present

## 2019-10-18 DIAGNOSIS — G894 Chronic pain syndrome: Secondary | ICD-10-CM | POA: Diagnosis not present

## 2019-10-18 DIAGNOSIS — Z9889 Other specified postprocedural states: Secondary | ICD-10-CM | POA: Diagnosis not present

## 2019-11-01 ENCOUNTER — Telehealth: Payer: Self-pay

## 2019-11-01 ENCOUNTER — Other Ambulatory Visit: Payer: Self-pay

## 2019-11-01 MED ORDER — AMLODIPINE-VALSARTAN-HCTZ 5-160-12.5 MG PO TABS: 1.0000 | ORAL_TABLET | Freq: Every day | ORAL | 0 refills | Status: DC

## 2019-11-01 NOTE — Telephone Encounter (Signed)
Pt. Called stating he needs a refill on his [facility]od[facility]ine[facility]z [facility] [place] [facility]. Last apt was 06/26/19 and has no future apt.

## 2019-11-01 NOTE — Telephone Encounter (Signed)
Medication sent.

## 2019-11-05 ENCOUNTER — Other Ambulatory Visit: Payer: Self-pay

## 2019-11-05 ENCOUNTER — Ambulatory Visit (HOSPITAL_COMMUNITY)
Admission: RE | Admit: 2019-11-05 | Discharge: 2019-11-05 | Disposition: A | Discharge status: Home / Self Care | Payer: BC Managed Care – PPO | Source: Ambulatory Visit | Attending: Urology | Admitting: Urology

## 2019-11-05 DIAGNOSIS — I861 Scrotal varices: Secondary | ICD-10-CM | POA: Diagnosis not present

## 2019-11-05 DIAGNOSIS — N5082 Scrotal pain: Secondary | ICD-10-CM | POA: Diagnosis not present

## 2019-11-05 DIAGNOSIS — N442 Benign cyst of testis: Secondary | ICD-10-CM | POA: Diagnosis not present

## 2019-11-06 ENCOUNTER — Encounter: Payer: BC Managed Care – PPO | Attending: Psychology | Admitting: Psychology

## 2019-11-06 ENCOUNTER — Encounter: Payer: Self-pay | Admitting: Psychology

## 2019-11-06 ENCOUNTER — Telehealth: Payer: Self-pay | Admitting: Medical

## 2019-11-06 DIAGNOSIS — M5410 Radiculopathy, site unspecified: Secondary | ICD-10-CM

## 2019-11-06 DIAGNOSIS — G8929 Other chronic pain: Secondary | ICD-10-CM | POA: Diagnosis not present

## 2019-11-06 DIAGNOSIS — Z9889 Other specified postprocedural states: Secondary | ICD-10-CM

## 2019-11-06 DIAGNOSIS — G894 Chronic pain syndrome: Secondary | ICD-10-CM

## 2019-11-06 DIAGNOSIS — M5442 Lumbago with sciatica, left side: Secondary | ICD-10-CM | POA: Diagnosis not present

## 2019-11-06 NOTE — Telephone Encounter (Signed)
Called pharmacy & authorized change and called pt & informed
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
## 2019-11-06 NOTE — Telephone Encounter (Signed)
Pt called & he's out of his BP med & [facility].  [facility] & they do not know when it will be in.  No other stores have it.  They do have Valsartan/Hctz 160/12.5 and Amlodipine 5mg .  Ok to switch per .
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
## 2019-11-12 DIAGNOSIS — N202 Calculus of kidney with calculus of ureter: Secondary | ICD-10-CM | POA: Diagnosis not present

## 2019-11-12 DIAGNOSIS — N5082 Scrotal pain: Secondary | ICD-10-CM | POA: Diagnosis not present

## 2019-11-20 ENCOUNTER — Ambulatory Visit: Payer: BC Managed Care – PPO | Admitting: Psychology

## 2019-11-22 DIAGNOSIS — M5416 Radiculopathy, lumbar region: Secondary | ICD-10-CM | POA: Diagnosis not present

## 2019-11-22 DIAGNOSIS — M5126 Other intervertebral disc displacement, lumbar region: Secondary | ICD-10-CM | POA: Diagnosis not present

## 2019-11-22 DIAGNOSIS — Z1152 Encounter for screening for COVID-19: Secondary | ICD-10-CM | POA: Diagnosis not present

## 2019-11-28 DIAGNOSIS — M5127 Other intervertebral disc displacement, lumbosacral region: Secondary | ICD-10-CM | POA: Diagnosis not present

## 2019-11-28 DIAGNOSIS — Z9889 Other specified postprocedural states: Secondary | ICD-10-CM | POA: Diagnosis not present

## 2019-11-28 DIAGNOSIS — M5117 Intervertebral disc disorders with radiculopathy, lumbosacral region: Secondary | ICD-10-CM | POA: Diagnosis not present

## 2019-11-28 DIAGNOSIS — M4727 Other spondylosis with radiculopathy, lumbosacral region: Secondary | ICD-10-CM | POA: Diagnosis not present

## 2019-11-28 DIAGNOSIS — M2578 Osteophyte, vertebrae: Secondary | ICD-10-CM | POA: Diagnosis not present

## 2019-11-28 DIAGNOSIS — M5440 Lumbago with sciatica, unspecified side: Secondary | ICD-10-CM | POA: Diagnosis not present

## 2019-12-27 ENCOUNTER — Ambulatory Visit: Payer: BC Managed Care – PPO | Admitting: Psychology

## 2020-01-03 DIAGNOSIS — M5126 Other intervertebral disc displacement, lumbar region: Secondary | ICD-10-CM | POA: Diagnosis not present

## 2020-01-27 ENCOUNTER — Other Ambulatory Visit: Payer: Self-pay | Admitting: Medical

## 2020-02-05 DIAGNOSIS — M5440 Lumbago with sciatica, unspecified side: Secondary | ICD-10-CM | POA: Diagnosis not present

## 2020-03-18 ENCOUNTER — Other Ambulatory Visit: Payer: Self-pay | Admitting: Medical

## 2020-03-19 ENCOUNTER — Other Ambulatory Visit: Payer: Self-pay

## 2020-03-19 ENCOUNTER — Telehealth: Payer: Self-pay | Admitting: Medical

## 2020-03-19 MED ORDER — AMLODIPINE-VALSARTAN-HCTZ 5-160-12.5 MG PO TABS: 1.0000 | ORAL_TABLET | Freq: Every day | ORAL | 0 refills | Status: DC

## 2020-03-19 NOTE — Telephone Encounter (Signed)
Pt called and needs the Amlodipine refilled. He uses the [facility] in [place].

## 2020-03-19 NOTE — Telephone Encounter (Signed)
The combo was sent to the pharmacy. Patient has been advised to schedule an appointment.

## 2020-03-19 NOTE — Telephone Encounter (Signed)
Correct medication has been sent to pharmacy and patient has been advised to schedule an appointment.

## 2020-03-19 NOTE — Telephone Encounter (Signed)
I received a refill request on plain valsartan.  We had changed to a combo medicine several months ago.  There was a calling in a few months ago to where we had to change it short-term individual medicines given insurance changes.  Currently what is listed in the chart is the combo  I does want to make sure he is taking amlodipine/valsartan/HCT combination?  If so he needs to let his pharmacy know that the plain valsartan was discontinued months ago  See if he is due for fasting physical based on last calendar visit visit

## 2020-03-21 ENCOUNTER — Other Ambulatory Visit: Payer: Self-pay | Admitting: Medical

## 2020-03-24 ENCOUNTER — Other Ambulatory Visit: Payer: Self-pay | Admitting: Medical

## 2020-03-25 ENCOUNTER — Other Ambulatory Visit: Payer: Self-pay | Admitting: Medical

## 2020-03-25 ENCOUNTER — Telehealth: Payer: Self-pay | Admitting: Medical

## 2020-03-25 MED ORDER — AMLODIPINE BESYLATE 10 MG PO TABS: 10.0000 mg | ORAL_TABLET | Freq: Every day | ORAL | 1 refills | Status: DC

## 2020-03-25 MED ORDER — HYDROCHLOROTHIAZIDE 12.5 MG PO CAPS: 12.5000 mg | ORAL_CAPSULE | Freq: Every day | ORAL | 1 refills | Status: DC

## 2020-03-25 MED ORDER — VALSARTAN 160 MG PO TABS: 160.0000 mg | ORAL_TABLET | Freq: Every day | ORAL | 1 refills | Status: DC

## 2020-03-25 NOTE — Telephone Encounter (Signed)
Pt states his BP medicine is on back order and pharmacy doesn't know when it will be back in.  Can you switch him to another medication temporarily.  He will call other pharmacies and see if anybody has it.

## 2020-03-25 NOTE — Telephone Encounter (Signed)
Rx sent  Schedule fasting physical in the next 45 days

## 2020-03-26 NOTE — Telephone Encounter (Signed)
Pt already has a CPE scheduled

## 2020-04-05 DIAGNOSIS — M549 Dorsalgia, unspecified: Secondary | ICD-10-CM

## 2020-04-05 DIAGNOSIS — G8929 Other chronic pain: Secondary | ICD-10-CM

## 2020-04-05 DIAGNOSIS — M5410 Radiculopathy, site unspecified: Secondary | ICD-10-CM

## 2020-04-05 HISTORY — DX: Dorsalgia, unspecified: M54.9

## 2020-04-05 HISTORY — DX: Radiculopathy, site unspecified: M54.10

## 2020-04-05 HISTORY — DX: Other chronic pain: G89.29

## 2020-04-08 DIAGNOSIS — Z6826 Body mass index (BMI) 26.0-26.9, adult: Secondary | ICD-10-CM | POA: Diagnosis not present

## 2020-04-08 DIAGNOSIS — I10 Essential (primary) hypertension: Secondary | ICD-10-CM | POA: Diagnosis not present

## 2020-04-08 DIAGNOSIS — M4807 Spinal stenosis, lumbosacral region: Secondary | ICD-10-CM | POA: Diagnosis not present

## 2020-04-16 ENCOUNTER — Other Ambulatory Visit: Payer: Self-pay | Admitting: Medical

## 2020-04-17 ENCOUNTER — Encounter: Payer: Self-pay | Admitting: Medical

## 2020-04-17 ENCOUNTER — Other Ambulatory Visit (INDEPENDENT_AMBULATORY_CARE_PROVIDER_SITE_OTHER): Payer: BC Managed Care – PPO

## 2020-04-17 ENCOUNTER — Telehealth (INDEPENDENT_AMBULATORY_CARE_PROVIDER_SITE_OTHER): Payer: BC Managed Care – PPO | Admitting: Medical

## 2020-04-17 ENCOUNTER — Other Ambulatory Visit: Payer: Self-pay

## 2020-04-17 VITALS — Temp 97.6°F | Ht 73.0 in | Wt 195.0 lb

## 2020-04-17 DIAGNOSIS — R059 Cough, unspecified: Secondary | ICD-10-CM | POA: Diagnosis not present

## 2020-04-17 DIAGNOSIS — Z20822 Contact with and (suspected) exposure to covid-19: Secondary | ICD-10-CM | POA: Diagnosis not present

## 2020-04-17 DIAGNOSIS — R52 Pain, unspecified: Secondary | ICD-10-CM | POA: Diagnosis not present

## 2020-04-17 LAB — POC COVID19 BINAXNOW: SARS Coronavirus 2 Ag: NEGATIVE

## 2020-04-17 NOTE — Progress Notes (Signed)
AVS will show in mychart. Does an email still need to be sent?

## 2020-04-17 NOTE — Progress Notes (Signed)
Subjective:     Patient ID: [name], male   DOB: Mar 25, 1976, 45 y.o.   MRN: [id number]  This visit type was conducted due to national recommendations for restrictions regarding the COVID-19 Pandemic (e.g. social distancing) in an effort to limit this patient's exposure and mitigate transmission in our community.  Due to their co-morbid illnesses, this patient is at least at moderate risk for complications without adequate follow up.  This format is felt to be most appropriate for this patient at this time.    Documentation for virtual audio and video telecommunications through [place] encounter:  The patient was located at home. The provider was located in the office. The patient did consent to this visit and is aware of possible charges through their insurance for this visit.  The other persons participating in this telemedicine service were none. Time spent on call was 20 minutes and in review of previous records 20 minutes total.  This virtual service is not related to other E/M service within previous 7 days.   HPI Chief Complaint  Patient presents with  . Covid Exposure    Exposed to covid on 04/12/20. Scratchy throat, bodyache, fatigue, cough, congestion. Symptoms started 04/15/20. Have not been tested. Had covid vaccines    Virtual consult for possible covid, illness.  Was exposed to sister and other family that found out they had covid.  Exposed on 04/12/20 to covid.  Earlier this week wife started getting sick, then he started feeling bad on 04/15/20.  He reports body aches, coughing, throat dry, head stopped up feeling but nose not running.   No NVD, no SOB or wheezing.  Feels tired.  No fever.  He has had covid vaccine.    Using nothing for symptoms.  Fluid intake is good.    Has asthma, but not having to use inhaler.   Hx/o sarcoidosis but no issues in years.  Nonsmoker.  No other aggravating or relieving factors. No other complaint.  Past Medical History:   Diagnosis Date  . Asthma    last asthma attack last year in 2012  . Hearing impairment    wears hearing aids  . History of kidney stones   . Hypertension    off bp meds  last 2 years   . Renal stone   . Sarcoidosis   . Wears glasses      Review of Systems As in subjective    Objective:   Physical Exam Due to coronavirus pandemic stay at home measures, patient visit was virtual and they were not examined in person.   Temp 97.6 F (36.4 C)   Ht 6\' 1"  (1.854 m)   Wt 195 lb (88.5 kg)   BMI 25.73 kg/m   General: Well-developed well-nourished no acute distress, mildly ill-appearing No obvious shortness of breath or wheezing Answers questions in complete sentences     Assessment:     Encounter Diagnoses  Name Primary?  . Close exposure to COVID-19 virus Yes  . Cough   . Body aches        Plan:     He had close exposure to several family members last week so very likely COVID infection early symptoms.  Fortunately his symptoms currently are mild.  He will come in this morning for testing in our back parking lot.  He has had the COVID-vaccine.  He has a history of asthma and advised to use albuterol if needed if symptoms progress.  History of sarcoidosis but no flareup in  years.  We reviewed the following recommendations  General recommendations if you have respiratory symptoms: We recommend you rest, hydrate well with water and clear fluids throughout the day such as water, soup broth, ice chips, or possibly pedialyte or G2 no sugar gatorade.   You can use Tylenol over the counter for pain or fever every 4 - 6 hours You can use over the counter Delsym or mucinex DM for cough unless your provider prescribed a cough medication already. You can use over the counter Emetrol over the counter for nausea.    Consider EmergenC Immune plus vitamin pack over the counter which contains extra vitamin C, vitamin D, and zinc.  If you are having trouble breathing, if you are  very weak, have high fever 103 or higher consistently despite Tylenol, or uncontrollable nausea and vomiting, then call or go to the emergency department.    Covid symptoms such as fatigue and cough can linger over 2 weeks, even after the initial fever, aches, chills, and other initial symptoms.   Self Quarantine and Isolation: Log onto [employer] for up to date quarantine recommendations.   [email or web address]   If you test Covid +, regardless of vaccination status:  Stay home for 5 days. If you have no symptoms or your symptoms are resolving after 5 days, then you can leave your house. Continue to wear a mask around others for 5 additional days. If you have a fever, continue to stay home until your fever resolves.  This could take 7-10 days from onset of symptoms or longer in some cases. If you have lots of coughing, sneezing, and significant runny nose and congestion, continue to stay at home until your symptoms are resolving   If you were exposed to someone with Covid: If you: Have been boosted OR Completed the primary series of Pfizer or Moderna vaccine within the last 6 months OR Completed the primary series of J&J vaccine within the last 2 months  Wear a mask around others for 10 days.  Test on day 5, if possible.   If you test positive on day 5 or more after exposure, and no symptoms, then wear a mask around others for 10 days  If you test positive and have symptoms, then follow the positive covid result isolation recommendations above If you test negative and have no symptoms on day 5 after exposure, then you may end isolation and be around others    If you were exposed to someone with Covid: If you: Completed the primary series of Pfizer or Moderna vaccine over 6 months ago and are not boosted OR Completed the primary series of J&J over 2 months ago and are not boosted OR Are unvaccinated  Stay home  for 5 days. After that continue to wear a mask around others for 5 additional days. If you can't quarantine you must wear a mask for 10 days. Test on day 5 if possible. If you test positive on day 5 or more after exposure, and no symptoms, then wear a mask around others for 10 days  If you test positive and have symptoms, then follow the positive covid result isolation recommendations above If you test negative and have no symptoms on day 5 after exposure, then you can end isolation but wear a mask around others for 5 more days   If you test Covid negative, but have respiratory symptoms:  Continue to wear a mask around others for 5 additional days. If you have a fever, continue to  stay home until your fever resolves.   If you have lots of coughing, sneezing, and significant runny nose and congestion, continue to stay at home until your symptoms are resolving   Isolation means avoiding contact with people as much as possible.   Particularly in your house, isolate your self from others in a separate room, wear a mask when possible in the room, particularly if coughing a lot.   Have others bring food, water, medications, etc., to your door, but avoid direct contact with your household contacts during this time to avoid spreading the infection to them.   If you have a separate bathroom and living quarters during the next 2 weeks away from others, that would be preferable.    If you can't completely isolate, then wear a mask, wash hands frequently with soap and water for at least 15 seconds, minimize close contact with others, and have a friend or family member check regularly from a distance to make sure you are not getting seriously worse.     You should not be going out in public, should not be going to stores, to work or other public places until all your symptoms have resolved.  One of the goals is to limit spread to high risk people; people that are older and elderly, people with multiple health  issues like diabetes, heart disease, lung disease, and anybody that has weakened immune systems such as people with cancer or on immunosuppressive therapy.  Kerri was seen today for covid exposure.  Diagnoses and all orders for this visit:  Close exposure to COVID-19 virus  Cough  Body aches     Follow-up in our back parking lot today for Covid testing

## 2020-04-19 LAB — NOVEL CORONAVIRUS, NAA: SARS-CoV-2, NAA: NOT DETECTED

## 2020-04-19 LAB — SARS-COV-2, NAA 2 DAY TAT

## 2020-05-06 ENCOUNTER — Encounter: Payer: Self-pay | Admitting: Medical

## 2020-05-06 ENCOUNTER — Ambulatory Visit: Payer: BC Managed Care – PPO | Admitting: Medical

## 2020-05-06 ENCOUNTER — Other Ambulatory Visit: Payer: Self-pay

## 2020-05-06 VITALS — BP 120/82 | HR 84 | Ht 73.0 in | Wt 200.2 lb

## 2020-05-06 DIAGNOSIS — J301 Allergic rhinitis due to pollen: Secondary | ICD-10-CM

## 2020-05-06 DIAGNOSIS — Z974 Presence of external hearing-aid: Secondary | ICD-10-CM | POA: Insufficient documentation

## 2020-05-06 DIAGNOSIS — Z0000 Encounter for general adult medical examination without abnormal findings: Secondary | ICD-10-CM | POA: Insufficient documentation

## 2020-05-06 DIAGNOSIS — J4520 Mild intermittent asthma, uncomplicated: Secondary | ICD-10-CM

## 2020-05-06 DIAGNOSIS — N529 Male erectile dysfunction, unspecified: Secondary | ICD-10-CM

## 2020-05-06 DIAGNOSIS — Z8249 Family history of ischemic heart disease and other diseases of the circulatory system: Secondary | ICD-10-CM

## 2020-05-06 DIAGNOSIS — Z23 Encounter for immunization: Secondary | ICD-10-CM

## 2020-05-06 DIAGNOSIS — Z125 Encounter for screening for malignant neoplasm of prostate: Secondary | ICD-10-CM | POA: Diagnosis not present

## 2020-05-06 DIAGNOSIS — Z87442 Personal history of urinary calculi: Secondary | ICD-10-CM

## 2020-05-06 DIAGNOSIS — I10 Essential (primary) hypertension: Secondary | ICD-10-CM

## 2020-05-06 DIAGNOSIS — R7301 Impaired fasting glucose: Secondary | ICD-10-CM

## 2020-05-06 DIAGNOSIS — N200 Calculus of kidney: Secondary | ICD-10-CM

## 2020-05-06 DIAGNOSIS — Z862 Personal history of diseases of the blood and blood-forming organs and certain disorders involving the immune mechanism: Secondary | ICD-10-CM

## 2020-05-06 DIAGNOSIS — Z2821 Immunization not carried out because of patient refusal: Secondary | ICD-10-CM

## 2020-05-06 DIAGNOSIS — M5412 Radiculopathy, cervical region: Secondary | ICD-10-CM

## 2020-05-06 DIAGNOSIS — M5442 Lumbago with sciatica, left side: Secondary | ICD-10-CM

## 2020-05-06 DIAGNOSIS — N182 Chronic kidney disease, stage 2 (mild): Secondary | ICD-10-CM

## 2020-05-06 DIAGNOSIS — Z9889 Other specified postprocedural states: Secondary | ICD-10-CM

## 2020-05-06 DIAGNOSIS — G8929 Other chronic pain: Secondary | ICD-10-CM

## 2020-05-06 DIAGNOSIS — R809 Proteinuria, unspecified: Secondary | ICD-10-CM

## 2020-05-06 DIAGNOSIS — M5410 Radiculopathy, site unspecified: Secondary | ICD-10-CM

## 2020-05-06 DIAGNOSIS — Z1211 Encounter for screening for malignant neoplasm of colon: Secondary | ICD-10-CM

## 2020-05-06 DIAGNOSIS — Z7185 Encounter for immunization safety counseling: Secondary | ICD-10-CM

## 2020-05-06 MED ORDER — ALBUTEROL SULFATE HFA 108 (90 BASE) MCG/ACT IN AERS: INHALATION_SPRAY | RESPIRATORY_TRACT | 1 refills | Status: DC

## 2020-05-06 MED ORDER — SILDENAFIL CITRATE 100 MG PO TABS: 50.0000 mg | ORAL_TABLET | Freq: Every day | ORAL | 5 refills | Status: DC | PRN

## 2020-05-06 MED ORDER — VALSARTAN-HYDROCHLOROTHIAZIDE 160-12.5 MG PO TABS: 1.0000 | ORAL_TABLET | Freq: Every day | ORAL | 3 refills | Status: DC

## 2020-05-06 MED ORDER — AMLODIPINE BESYLATE 10 MG PO TABS: 10.0000 mg | ORAL_TABLET | Freq: Every day | ORAL | 3 refills | Status: DC

## 2020-05-06 NOTE — Progress Notes (Signed)
Subjective:   HPI  [name] is a 45 y.o. male who presents for Chief Complaint  Patient presents with  . Annual Exam    Physical, not fasting     Patient Care Team: Tysinger, Kermit Balo, PA-C as PCP - General (Family Medicine) Sees dentist Sees eye doctor Dr.Gary Wynetta Emery, [facility] Dr. Sebastian Ache, [facility] Dr. Amada Jupiter, GI Dr. Gershon Mussel, orthopedics Dr. Tyrell Antonio and Dr. Arley Phenix, phys med  Concerns: Compliant with BP medicaiton he is compliant with amlodipine, compliant with individual valsartan and hydrochlorothiazide.  He would like to go back to the combo valsartan HCT but the pharmacy tends to be out of this, says is on backorder.  He would like to go back on Viagra.  He had good response with this in the past  Seeing neurosurgery regularly. Had back surgery 11/2019  Sees urology q54mo, hx/o elevated calcium, ureteral stones   Reviewed their medical, surgical, family, social, medication, and allergy history and updated chart as appropriate.  Past Medical History:  Diagnosis Date  . Asthma   . Chronic back pain 2022   [facility] neurosurgery  . Chronic neck pain    [facility]  . Hearing impairment    wears hearing aids  . History of kidney stones   . History of sarcoidosis   . Hypertension   . Radiculopathy 2022   sees neurosurgery  . Renal stone   . Wears glasses     Past Surgical History:  Procedure Laterality Date  . CYSTOSCOPY W/ RETROGRADES Right 08/20/2015   Procedure: CYSTOSCOPY WITH RETROGRADE PYELOGRAM;  Surgeon: Sebastian Ache, MD;  Location: [facility];  Service: Urology;  Laterality: Right;  . CYSTOSCOPY WITH URETEROSCOPY AND STENT PLACEMENT Right 08/20/2015   Procedure: CYSTOSCOPY WITH URETEROSCOPY AND STENT PLACEMENT;  Surgeon: Sebastian Ache, MD;  Location: [facility];  Service: Urology;  Laterality: Right;  . CYSTOSCOPY/RETROGRADE/URETEROSCOPY  01/12/2012    Procedure: CYSTOSCOPY/RETROGRADE/URETEROSCOPY;  Surgeon: Sebastian Ache, MD;  Location: [facility];  Service: Urology;  Laterality: Right;  . CYSTOSCOPY/URETEROSCOPY/HOLMIUM LASER/STENT PLACEMENT Left 06/16/2016   Procedure: STAGE ONE-CYSTOSCOPY/DIAGNOSTIC URETEROSCOPY/RETROGRADE/STENT PLACEMENT;  Surgeon: Sebastian Ache, MD;  Location: [facility];  Service: Urology;  Laterality: Left;  . CYSTOSCOPY/URETEROSCOPY/HOLMIUM LASER/STENT PLACEMENT Left 06/30/2016   Procedure: STAGE TWO-CYSTOSCOPY/URETEROSCOPY/HOLMIUM LASER/STENT EXCHANGE;  Surgeon: Sebastian Ache, MD;  Location: [facility];  Service: Urology;  Laterality: Left;  . ESOPHAGEAL DILATION    . HOLMIUM LASER APPLICATION Right 08/20/2015   Procedure: HOLMIUM LASER APPLICATION;  Surgeon: Sebastian Ache, MD;  Location: [facility];  Service: Urology;  Laterality: Right;  . NEPHROLITHOTOMY  02/11/2012   Procedure: NEPHROLITHOTOMY PERCUTANEOUS;  Surgeon: Sebastian Ache, MD;  Location: [facility];  Service: Urology;  Laterality: Right;  . NEPHROLITHOTOMY Right 01/30/2015   Procedure: NEPHROLITHOTOMY PERCUTANEOUS, NEPHROSTOGRAM;  Surgeon: Sebastian Ache, MD;  Location: [facility];  Service: Urology;  Laterality: Right;  . STONE EXTRACTION WITH BASKET Right 08/20/2015   Procedure: STONE EXTRACTION WITH BASKET;  Surgeon: Sebastian Ache, MD;  Location: [facility];  Service: Urology;  Laterality: Right;  . URETEROSCOPY  01/30/2015   Procedure: URETEROSCOPY WITH BASKETING;  Surgeon: Sebastian Ache, MD;  Location: [facility];  Service: Urology;;    Family History  Problem Relation Age of Onset  . Hypertension Mother   . Asthma Mother   . Diabetes Father   . Heart attack Father   . Stroke Father   . Heart disease Father   .  Liver disease Sister   . Cancer Paternal Grandmother   . Stomach cancer Neg Hx   . Colon cancer Neg Hx   . Pancreatic cancer Neg Hx   . Esophageal cancer Neg Hx      Current Outpatient Medications:   .  valsartan-hydrochlorothiazide (DIOVAN HCT) 160-12.5 MG tablet, Take 1 tablet by mouth daily., Disp: 90 tablet, Rfl: 3 .  albuterol (VENTOLIN HFA) 108 (90 Base) MCG/ACT inhaler, TAKE 2 PUFFS BY MOUTH EVERY 6 HOURS AS NEEDED FOR WHEEZE OR SHORTNESS OF BREATH, Disp: 18 each, Rfl: 1 .  amLODipine (NORVASC) 10 MG tablet, Take 1 tablet (10 mg total) by mouth daily., Disp: 90 tablet, Rfl: 3 .  sildenafil (VIAGRA) 100 MG tablet, Take 0.5-1 tablets (50-100 mg total) by mouth daily as needed for erectile dysfunction., Disp: 20 tablet, Rfl: 5  No Known Allergies     Review of Systems Constitutional: -fever, -chills, -sweats, -unexpected weight change, -decreased appetite, -fatigue Allergy: -sneezing, -itching, -congestion Dermatology: -changing moles, --rash, -lumps ENT: -runny nose, -ear pain, -sore throat, -hoarseness, -sinus pain, -teeth pain, - ringing in ears, -hearing loss, -nosebleeds Cardiology: -chest pain, -palpitations, -swelling, -difficulty breathing when lying flat, -waking up short of breath Respiratory: -cough, -shortness of breath, -difficulty breathing with exercise or exertion, -wheezing, -coughing up blood Gastroenterology: -abdominal pain, -nausea, -vomiting, -diarrhea, -constipation, -blood in stool, -changes in bowel movement, -difficulty swallowing or eating Hematology: -bleeding, -bruising  Musculoskeletal: -joint aches, -muscle aches, -joint swelling, +back pain, +neck pain, -cramping, -changes in gait Ophthalmology: denies vision changes, eye redness, itching, discharge Urology: -burning with urination, -difficulty urinating, -blood in urine, -urinary frequency, -urgency, -incontinence Neurology: -headache, -weakness, -tingling, -numbness, -memory loss, -falls, -dizziness Psychology: -depressed mood, -agitation, -sleep problems Male GU: no testicular mass, pain, no lymph nodes swollen, no swelling, no rash.     Objective:  BP 120/82   Pulse 84   Ht 6\' 1"  (1.854 m)    Wt 200 lb 3.2 oz (90.8 kg)   SpO2 98%   BMI 26.41 kg/m   General appearance: alert, no distress, WD/WN, African American male Skin: unremarkable HEENT: normocephalic, conjunctiva/corneas normal, sclerae anicteric, PERRLA, EOMi, nares patent, no discharge or erythema, pharynx normal Oral cavity: MMM, tongue normal, teeth normal Neck: supple, no lymphadenopathy, no thyromegaly, no masses, normal ROM, no bruits Chest: non tender, normal shape and expansion Heart: RRR, normal S1, S2, no murmurs Lungs: CTA bilaterally, no wheezes, rhonchi, or rales Abdomen: +bs, soft, non tender, non distended, no masses, no hepatomegaly, no splenomegaly, no bruits Back: Lumbar spine surgical scar, non tender, normal ROM, no scoliosis Musculoskeletal: upper extremities non tender, no obvious deformity, normal ROM throughout, lower extremities non tender, no obvious deformity, normal ROM throughout Extremities: no edema, no cyanosis, no clubbing Pulses: 2+ symmetric, upper and lower extremities, normal cap refill Neurological: alert, oriented x 3, CN2-12 intact, strength normal upper extremities and lower extremities, sensation normal throughout, DTRs 2+ throughout, no cerebellar signs, gait normal Psychiatric: normal affect, behavior normal, pleasant  GU: normal male external genitalia,circumcised, nontender, no masses, no hernia, no lymphadenopathy Rectal: Anus normal tone, prostate within normal limits   Assessment and Plan :   Encounter Diagnoses  Name Primary?  . Encounter for health maintenance examination in adult Yes  . Need for Tdap vaccination   . Essential hypertension, benign   . Allergic rhinitis due to pollen, unspecified seasonality   . Impaired fasting glucose   . Cervical radiculopathy   . Chronic left-sided low back pain with left-sided  sciatica   . Staghorn kidney stones   . Nephrolithiasis   . CKD (chronic kidney disease) stage 2, GFR 60-89 ml/min   . Screening for prostate  cancer   . Radicular pain of left lower extremity   . Proteinuria, unspecified type   . History of sarcoidosis   . History of kidney stones   . History of back surgery   . Family history of heart disease   . Erectile dysfunction, unspecified erectile dysfunction type   . Influenza vaccination declined   . Screen for colon cancer   . Vaccine counseling   . Mild intermittent asthma, unspecified whether complicated   . Uses hearing aid     Today you had a preventative care visit or wellness visit.    Topics today may have included healthy lifestyle, diet, exercise, preventative care, vaccinations, sick and well care, proper use of emergency dept and after hours care, as well as other concerns.    I spent a lot of time today cleaning of the chart record removing stuff in the problem list that was not active and updating records as appropriate   Recommendations: Continue to return yearly for your annual wellness and preventative care visits.  This gives [place] a chance to discuss healthy lifestyle, exercise, vaccinations, review your chart record, and perform screenings where appropriate.  I recommend you see your eye doctor yearly for routine vision care.  I recommend you see your dentist yearly for routine dental care including hygiene visits twice yearly.   Vaccination recommendations were reviewed  Declines flu shot You note being up to date on covid vaccine  Counseled on the Tdap (tetanus, diptheria, and acellular pertussis) vaccine.  Vaccine information sheet given. Tdap vaccine given after consent obtained.    Screening for cancer: Colon cancer screening:  We will refer you for screening colonoscopy  Cancer screening You should do a monthly self testicular exam  We discussed PSA, prostate exam, and prostate cancer screening risks/benefits.     Skin cancer screening: Check your skin regularly for new changes, growing lesions, or other lesions of concern Come in for  evaluation if you have skin lesions of concern.  Lung cancer screening: If you have a greater than 30 pack year history of tobacco use, then you qualify for lung cancer screening with a chest CT scan  We currently don't have screenings for other cancers besides breast, cervical, colon, and lung cancers.  If you have a strong family history of cancer or have other cancer screening concerns, please let me know.    Bone health: Get at least 150 minutes of aerobic exercise weekly Get weight bearing exercise at least once weekly    Heart health: Get at least 150 minutes of aerobic exercise weekly Limit alcohol It is important to maintain a healthy blood pressure and healthy cholesterol numbers    Separate significant issues discussed: Hypertension-blood pressure is at goal today.  Continue amlodipine valsartan and hydrochlorothiazide.  I will send a combo valsartan HCT again to see if pharmacy has this on hand  Erectile dysfunction-discussed risk and benefits of medication, refill Viagra, discussed proper use of medication  Hearing loss-wears hearing aids  History of kidney stone and history of hydronephrosis-sees urology every 6 months  History of chronic neck and back pain, radicular pain, recent surgery in August 2021, sees neurosurgery regularly  Impaired glucose-labs today   Selestino was seen today for annual exam.  Diagnoses and all orders for this visit:  Encounter for health  maintenance examination in adult -     Comprehensive metabolic panel -     CBC with Differential/Platelet -     PSA -     Lipid panel -     Hemoglobin A1c  Need for Tdap vaccination  Essential hypertension, benign -     Comprehensive metabolic panel  Allergic rhinitis due to pollen, unspecified seasonality  Impaired fasting glucose -     Hemoglobin A1c  Cervical radiculopathy  Chronic left-sided low back pain with left-sided sciatica  Staghorn kidney stones  Nephrolithiasis  CKD  (chronic kidney disease) stage 2, GFR 60-89 ml/min  Screening for prostate cancer -     PSA  Radicular pain of left lower extremity  Proteinuria, unspecified type  History of sarcoidosis  History of kidney stones  History of back surgery  Family history of heart disease  Erectile dysfunction, unspecified erectile dysfunction type  Influenza vaccination declined  Screen for colon cancer -     Ambulatory referral to Gastroenterology  Vaccine counseling  Mild intermittent asthma, unspecified whether complicated  Uses hearing aid  Other orders -     sildenafil (VIAGRA) 100 MG tablet; Take 0.5-1 tablets (50-100 mg total) by mouth daily as needed for erectile dysfunction. -     albuterol (VENTOLIN HFA) 108 (90 Base) MCG/ACT inhaler; TAKE 2 PUFFS BY MOUTH EVERY 6 HOURS AS NEEDED FOR WHEEZE OR SHORTNESS OF BREATH -     valsartan-hydrochlorothiazide (DIOVAN HCT) 160-12.5 MG tablet; Take 1 tablet by mouth daily. -     amLODipine (NORVASC) 10 MG tablet; Take 1 tablet (10 mg total) by mouth daily. -     Tdap vaccine greater than or equal to 7yo IM     Follow-up pending labs, yearly for physical

## 2020-05-07 ENCOUNTER — Other Ambulatory Visit: Payer: Self-pay | Admitting: Medical

## 2020-05-07 LAB — COMPREHENSIVE METABOLIC PANEL
ALT: 19 IU/L (ref 0–44)
AST: 24 IU/L (ref 0–40)
Albumin/Globulin Ratio: 1.5 (ref 1.2–2.2)
Albumin: 4.8 g/dL (ref 4.0–5.0)
Alkaline Phosphatase: 78 IU/L (ref 44–121)
BUN/Creatinine Ratio: 10 (ref 9–20)
BUN: 14 mg/dL (ref 6–24)
Bilirubin Total: 0.9 mg/dL (ref 0.0–1.2)
CO2: 23 mmol/L (ref 20–29)
Calcium: 10.5 mg/dL — ABNORMAL HIGH (ref 8.7–10.2)
Chloride: 98 mmol/L (ref 96–106)
Creatinine, Ser: 1.38 mg/dL — ABNORMAL HIGH (ref 0.76–1.27)
GFR calc Af Amer: 70 mL/min/{1.73_m2} (ref >59)
GFR calc non Af Amer: 61 mL/min/{1.73_m2} (ref >59)
Globulin, Total: 3.2 g/dL (ref 1.5–4.5)
Glucose: 81 mg/dL (ref 65–99)
Potassium: 4 mmol/L (ref 3.5–5.2)
Sodium: 139 mmol/L (ref 134–144)
Total Protein: 8 g/dL (ref 6.0–8.5)

## 2020-05-07 LAB — CBC WITH DIFFERENTIAL/PLATELET
Basophils Absolute: 0 10*3/uL (ref 0.0–0.2)
Basos: 0 %
EOS (ABSOLUTE): 0.1 10*3/uL (ref 0.0–0.4)
Eos: 1 %
Hematocrit: 45.3 % (ref 37.5–51.0)
Hemoglobin: 16 g/dL (ref 13.0–17.7)
Immature Grans (Abs): 0 10*3/uL (ref 0.0–0.1)
Immature Granulocytes: 0 %
Lymphocytes Absolute: 1.5 10*3/uL (ref 0.7–3.1)
Lymphs: 22 %
MCH: 29.5 pg (ref 26.6–33.0)
MCHC: 35.3 g/dL (ref 31.5–35.7)
MCV: 83 fL (ref 79–97)
Monocytes Absolute: 0.6 10*3/uL (ref 0.1–0.9)
Monocytes: 9 %
Neutrophils Absolute: 4.7 10*3/uL (ref 1.4–7.0)
Neutrophils: 68 %
Platelets: 391 10*3/uL (ref 150–450)
RBC: 5.43 x10E6/uL (ref 4.14–5.80)
RDW: 13.1 % (ref 11.6–15.4)
WBC: 6.9 10*3/uL (ref 3.4–10.8)

## 2020-05-07 LAB — LIPID PANEL
Chol/HDL Ratio: 5.2 ratio — ABNORMAL HIGH (ref 0.0–5.0)
Cholesterol, Total: 207 mg/dL — ABNORMAL HIGH (ref 100–199)
HDL: 40 mg/dL (ref >39)
LDL Chol Calc (NIH): 125 mg/dL — ABNORMAL HIGH (ref 0–99)
Triglycerides: 235 mg/dL — ABNORMAL HIGH (ref 0–149)
VLDL Cholesterol Cal: 42 mg/dL — ABNORMAL HIGH (ref 5–40)

## 2020-05-07 LAB — PSA: Prostate Specific Ag, Serum: 1.5 ng/mL (ref 0.0–4.0)

## 2020-05-07 LAB — HEMOGLOBIN A1C
Est. average glucose Bld gHb Est-mCnc: 114 mg/dL
Hgb A1c MFr Bld: 5.6 % (ref 4.8–5.6)

## 2020-05-07 MED ORDER — ROSUVASTATIN CALCIUM 10 MG PO TABS: 10.0000 mg | ORAL_TABLET | Freq: Every day | ORAL | 3 refills | Status: DC

## 2020-06-03 DIAGNOSIS — Z6826 Body mass index (BMI) 26.0-26.9, adult: Secondary | ICD-10-CM | POA: Diagnosis not present

## 2020-06-03 DIAGNOSIS — M4807 Spinal stenosis, lumbosacral region: Secondary | ICD-10-CM | POA: Diagnosis not present

## 2020-06-04 ENCOUNTER — Other Ambulatory Visit: Payer: Self-pay | Admitting: Neurosurgery

## 2020-06-04 DIAGNOSIS — M4807 Spinal stenosis, lumbosacral region: Secondary | ICD-10-CM

## 2020-06-05 DIAGNOSIS — R109 Unspecified abdominal pain: Secondary | ICD-10-CM | POA: Diagnosis not present

## 2020-06-05 DIAGNOSIS — R351 Nocturia: Secondary | ICD-10-CM | POA: Diagnosis not present

## 2020-06-05 DIAGNOSIS — R8271 Bacteriuria: Secondary | ICD-10-CM | POA: Diagnosis not present

## 2020-06-11 ENCOUNTER — Ambulatory Visit
Admission: RE | Admit: 2020-06-11 | Discharge: 2020-06-11 | Disposition: A | Discharge status: Home / Self Care | Payer: BC Managed Care – PPO | Source: Ambulatory Visit | Attending: Neurosurgery | Admitting: Neurosurgery

## 2020-06-11 DIAGNOSIS — M545 Low back pain, unspecified: Secondary | ICD-10-CM | POA: Diagnosis not present

## 2020-06-11 DIAGNOSIS — M4807 Spinal stenosis, lumbosacral region: Secondary | ICD-10-CM

## 2020-06-20 ENCOUNTER — Other Ambulatory Visit: Payer: Self-pay | Admitting: Medical

## 2020-07-15 ENCOUNTER — Ambulatory Visit: Payer: BC Managed Care – PPO | Admitting: Gastroenterology

## 2020-07-15 ENCOUNTER — Other Ambulatory Visit: Payer: Self-pay

## 2020-07-15 ENCOUNTER — Encounter: Payer: Self-pay | Admitting: Gastroenterology

## 2020-07-15 VITALS — BP 120/90 | HR 94 | Ht 73.0 in | Wt 195.0 lb

## 2020-07-15 DIAGNOSIS — N5082 Scrotal pain: Secondary | ICD-10-CM

## 2020-07-15 NOTE — Progress Notes (Signed)
[facility] Progress Note  Chief Complaint: Scrotal pain  Subjective  History: [name] was last seen in July 2020 for rectal bleeding.  Colonoscopy was notable for challenging scope passage due to markedly tortuous sigmoid colon.  It was otherwise normal exam, and specifically no anal rectal pathology found.  He appeared to have benign anal bleeding, next colonoscopy was recommended in 10 years.  [name] tells me he was sent here by primary care because of groin pain and family history of colon cancer. I clarified his family history, and he initially said a grandfather, 2 uncles and his father had colon cancer.  Then when I questioned him further, he said his father in fact did not have colon cancer he just had some type of cancer.  [name] has had at least several months of a tugging sensation in his scrotum.  He denies dysuria, hematuria or difficulty with urine stream.  His bowel habits are regular and he denies rectal bleeding. He reports seeing Dr. Berneice Heinrich of urology within the last few months, and says he was examined and had an x-ray done and was told everything was okay.  The sensation is still present and bothersome.  ROS: Cardiovascular:  no chest pain Respiratory: no dyspnea  The patient's Past Medical, Family and Social History were reviewed and are on file in the EMR. Family History  Problem Relation Age of Onset  . Hypertension Mother   . Asthma Mother   . Diabetes Father   . Heart attack Father   . Stroke Father   . Heart disease Father   . Liver disease Sister   . Cancer Paternal Grandmother   . Stomach cancer Neg Hx   . Colon cancer Neg Hx   . Pancreatic cancer Neg Hx   . Esophageal cancer Neg Hx     Objective:  Med list reviewed  Current Outpatient Medications:  .  albuterol (VENTOLIN HFA) 108 (90 Base) MCG/ACT inhaler, TAKE 2 PUFFS BY MOUTH EVERY 6 HOURS AS NEEDED FOR WHEEZE OR SHORTNESS OF BREATH, Disp: 18 each, Rfl: 1 .  amLODipine (NORVASC) 10  MG tablet, Take 1 tablet (10 mg total) by mouth daily., Disp: 90 tablet, Rfl: 3 .  rosuvastatin (CRESTOR) 10 MG tablet, Take 1 tablet (10 mg total) by mouth daily., Disp: 90 tablet, Rfl: 3 .  sildenafil (VIAGRA) 100 MG tablet, Take 0.5-1 tablets (50-100 mg total) by mouth daily as needed for erectile dysfunction., Disp: 20 tablet, Rfl: 5 .  valsartan-hydrochlorothiazide (DIOVAN HCT) 160-12.5 MG tablet, Take 1 tablet by mouth daily., Disp: 90 tablet, Rfl: 3   Vital signs in last 24 hrs: Vitals:   07/15/20 1430  BP: 120/90  Pulse: 94   Wt Readings from Last 3 Encounters:  07/15/20 195 lb (88.5 kg)  05/06/20 200 lb 3.2 oz (90.8 kg)  04/17/20 195 lb (88.5 kg)    Physical Exam  He is well-appearing. Scrotal exam normal.  Testicles similar size, nontender.  No apparent edema.  No inguinal hernia felt on either side.  Labs:   ___________________________________________ Radiologic studies:   ____________________________________________ Other:   _____________________________________________ Assessment & Plan  Assessment: Encounter Diagnosis  Name Primary?  . Scrotal pain Yes   His symptoms are urologic and have already been evaluated by Dr. Berneice Heinrich.  No current digestive symptoms.  No family history of colon cancer, next colonoscopy in 10 years unless symptoms should develop that warrant evaluation sooner than that.  Return to urologist as needed.  14 minutes  were spent on this encounter (including chart review, history/exam, counseling/coordination of care, and documentation)  [name] III

## 2020-07-15 NOTE — Patient Instructions (Signed)
If you are age 47 or older, your body mass index should be between 23-30. Your Body mass index is 25.73 kg/m. If this is out of the aforementioned range listed, please consider follow up with your Primary Care Provider.  If you are age 4 or younger, your body mass index should be between 19-25. Your Body mass index is 25.73 kg/m. If this is out of the aformentioned range listed, please consider follow up with your Primary Care Provider.   Follow up as needed.   It was a pleasure to see you today!  Dr. Myrtie Neither

## 2020-08-04 ENCOUNTER — Other Ambulatory Visit: Payer: Self-pay

## 2020-08-04 ENCOUNTER — Ambulatory Visit: Payer: BC Managed Care – PPO | Admitting: Medical

## 2020-08-04 ENCOUNTER — Encounter: Payer: Self-pay | Admitting: Medical

## 2020-08-04 VITALS — BP 120/90 | HR 77 | Ht 73.0 in | Wt 198.0 lb

## 2020-08-04 DIAGNOSIS — I10 Essential (primary) hypertension: Secondary | ICD-10-CM

## 2020-08-04 DIAGNOSIS — N182 Chronic kidney disease, stage 2 (mild): Secondary | ICD-10-CM

## 2020-08-04 DIAGNOSIS — E782 Mixed hyperlipidemia: Secondary | ICD-10-CM

## 2020-08-04 DIAGNOSIS — R7301 Impaired fasting glucose: Secondary | ICD-10-CM

## 2020-08-04 LAB — POCT URINALYSIS DIP (PROADVANTAGE DEVICE)
Bilirubin, UA: NEGATIVE
Blood, UA: NEGATIVE
Glucose, UA: NEGATIVE mg/dL
Ketones, POC UA: NEGATIVE mg/dL
Leukocytes, UA: NEGATIVE
Nitrite, UA: NEGATIVE
Specific Gravity, Urine: 1.02
Urobilinogen, Ur: 0.2
pH, UA: 6 (ref 5.0–8.0)

## 2020-08-04 MED ORDER — VALSARTAN-HYDROCHLOROTHIAZIDE 320-12.5 MG PO TABS: 1.0000 | ORAL_TABLET | Freq: Every day | ORAL | 3 refills | Status: DC

## 2020-08-04 NOTE — Patient Instructions (Addendum)
Hypertension - increase Valsartan HCT dose, continue Amlodipine  Impaired glucose - c/t to maintain healthy low sugar eating habits and regular exercise  Chronic kidney disease - surveillance labs today. Discussed diagnosis, prevention of worse complications  Mixed dyslipidemia - repeat labs since he added statin last visit   Chronic Kidney Disease, Adult Chronic kidney disease is when lasting damage happens to the kidneys slowly over a long time. The kidneys help to:  Make pee (urine).  Make hormones.  Keep the right amount of fluids and chemicals in the body. Most often, this disease does not go away. You must take steps to help keep the kidney damage from getting worse. If steps are not taken, the kidneys might stop working forever. What are the causes?  Diabetes.  High blood pressure.  Diseases that affect the heart and blood vessels.  Other kidney diseases.  Diseases of the body's disease-fighting system.  A problem with the flow of pee.  Infections of the organs that make pee, store it, and take it out of the body.  Swelling or irritation of your blood vessels. What increases the risk?  Getting older.  Having someone in your family who has kidney disease or kidney failure.  Having a disease caused by genes.  Taking medicines often that harm the kidneys.  Being near or having contact with harmful substances.  Being very overweight.  Using tobacco now or in the past. What are the signs or symptoms?  Feeling very tired.  Having a swollen face, legs, ankles, or feet.  Feeling like you may vomit or vomiting.  Not feeling hungry.  Being confused or not able to focus.  Twitches and cramps in the leg muscles or other muscles.  Dry, itchy skin.  A taste of metal in your mouth.  Making less pee, or making more pee.  Shortness of breath.  Trouble sleeping. You may also become anemic or get weak bones. Anemic means there is not enough red blood  cells or hemoglobin in your blood. You may get symptoms slowly. You may not notice them until the kidney damage gets very bad. How is this treated? Often, there is no cure for this disease. Treatment can help with symptoms and help keep the disease from getting worse. You may need to:  Avoid alcohol.  Avoid foods that are high in salt, potassium, phosphorous, and protein.  Take medicines for symptoms and to help control other conditions.  Have dialysis. This treatment gets harmful waste out of your body.  Treat other problems that cause your kidney disease or make it worse. Follow these instructions at home: Medicines  Take over-the-counter and prescription medicines only as told by your doctor.  Do not take any new medicines, vitamins, or supplements unless your doctor says it is okay. Lifestyle  Do not smoke or use any products that contain nicotine or tobacco. If you need help quitting, ask your doctor.  If you drink alcohol: ? Limit how much you use to:  0-1 drink a day for women who are not pregnant.  0-2 drinks a day for men. ? Know how much alcohol is in your drink. In the [place] one drink equals one 12 oz bottle of beer (355 mL), one 5 oz glass of wine (148 mL), or one 1 oz glass of hard liquor (44 mL).  Stay at a healthy weight. If you need help losing weight, ask your doctor.   General instructions  Follow instructions from your doctor about what you cannot  eat or drink.  Track your blood pressure at home. Tell your doctor about any changes.  If you have diabetes, track your blood sugar.  Exercise at least 30 minutes a day, 5 days a week.  Keep your shots (vaccinations) up to date.  Keep all follow-up visits.   Where to find more information  American Association of Kidney Patients: ResidentialShow.is  [employer]: www.kidney.org  [employer]: FightingMatch.com.ee  [employer]: www.lifeoptions.org  [employer]:  www.kidneyschool.org Contact a doctor if:  Your symptoms get worse.  You get new symptoms. Get help right away if:  You get symptoms of end-stage kidney disease. These include: ? Headaches. ? Losing feeling in your hands or feet. ? Easy bruising. ? Having hiccups often. ? Chest pain. ? Shortness of breath. ? Lack of menstrual periods, in women.  You have a fever.  You make less pee than normal.  You have pain or you bleed when you pee or poop. These symptoms may be an emergency. Get help right away. Call your local emergency services (911 in the [place]  Do not wait to see if the symptoms will go away.  Do not drive yourself to the hospital. Summary  Chronic kidney disease is when lasting damage happens to the kidneys slowly over a long time.  Causes of this disease include diabetes and high blood pressure.  Often, there is no cure for this disease. Treatment can help symptoms and help keep the disease from getting worse.  Treatment may involve lifestyle changes, medicines, and dialysis. This information is not intended to replace advice given to you by your health care provider. Make sure you discuss any questions you have with your health care provider. Document Revised: 06/27/2019 Document Reviewed: 06/27/2019 Elsevier Patient Education  2021 [employer].

## 2020-08-04 NOTE — Progress Notes (Signed)
Subjective:  [name] is a 45 y.o. male who presents for Chief Complaint  Patient presents with  . Follow-up    Follow up on blood pressure and kidney disease with fasting labs      Hypertension - home BPs looking good.  Compliant with amlodipine and valsartan HCT ,   Home readings about like today's reading, DBP runs 80-a little over 90.      Exercising 2 days per week.   hyperlipidemia - he is compliant Rosuvastatin since last visit 05/2020.   No side effects noted.  Saw GI after last visit, and he said GI doctor advised updated colonoscopy 2030.   No other aggravating or relieving factors.    No other c/o.  Past Medical History:  Diagnosis Date  . Asthma   . Chronic back pain 2022   [facility] neurosurgery  . Chronic neck pain    [facility] neurosurgery  . Hearing impairment    wears hearing aids  . History of kidney stones   . History of sarcoidosis   . Hypertension   . Radiculopathy 2022   sees neurosurgery  . Renal stone   . Wears glasses    Family History  Problem Relation Age of Onset  . Hypertension Mother   . Asthma Mother   . Diabetes Father   . Heart attack Father   . Stroke Father   . Heart disease Father   . Liver disease Sister   . Cancer Paternal Grandmother   . Stomach cancer Neg Hx   . Colon cancer Neg Hx   . Pancreatic cancer Neg Hx   . Esophageal cancer Neg Hx      The following portions of the patient's history were reviewed and updated as appropriate: allergies, current medications, past family history, past medical history, past social history, past surgical history and problem list.  ROS Otherwise as in subjective above    Objective: BP 120/90   Pulse 77   Ht 6\' 1"  (1.854 m)   Wt 198 lb (89.8 kg)   SpO2 97%   BMI 26.12 kg/m   BP Readings from Last 3 Encounters:  08/04/20 120/90  07/15/20 120/90  05/06/20 120/82   Wt Readings from Last 3 Encounters:  08/04/20 198 lb (89.8 kg)  07/15/20 195 lb (88.5 kg)  05/06/20 200  lb 3.2 oz (90.8 kg)    General appearance: alert, no distress, well developed, well nourished Neck: supple, no lymphadenopathy, no thyromegaly, no masses, no bruits Heart: RRR, normal S1, S2, no murmurs Lungs: CTA bilaterally, no wheezes, rhonchi, or rales Abdomen: +bs, soft, non tender, non distended, no masses, no hepatomegaly, no splenomegaly, no bruits Pulses: 2+ radial pulses, 2+ pedal pulses, normal cap refill Ext: no edema     Assessment: Encounter Diagnoses  Name Primary?  . Essential hypertension, benign Yes  . Impaired fasting glucose   . CKD (chronic kidney disease) stage 2, GFR 60-89 ml/min   . Mixed dyslipidemia      Plan: HTN - increase Valsartan HCT dose, continue Amlodipine  Impaired glucose - c/t to maintain healthy low sugar eating habits and regular exercise  Chronic kidney disease - surveillance labs today. Discussed diagnosis, prevention of worse complications  Mixed dyslipidemia - repeat labs since he added statin last visit   [name] was seen today for follow-up.  Diagnoses and all orders for this visit:  Essential hypertension, benign -     Renal Function Panel  Impaired fasting glucose  CKD (chronic kidney disease)  stage 2, GFR 60-89 ml/min -     Renal Function Panel  Mixed dyslipidemia -     Lipid panel  Other orders -     valsartan-hydrochlorothiazide (DIOVAN-HCT) 320-12.5 MG tablet; Take 1 tablet by mouth daily.    Follow up: pending labs

## 2020-08-05 ENCOUNTER — Other Ambulatory Visit: Payer: Self-pay | Admitting: Medical

## 2020-08-05 LAB — RENAL FUNCTION PANEL
Albumin: 4.7 g/dL (ref 4.0–5.0)
BUN/Creatinine Ratio: 9 (ref 9–20)
BUN: 14 mg/dL (ref 6–24)
CO2: 22 mmol/L (ref 20–29)
Calcium: 10.2 mg/dL (ref 8.7–10.2)
Chloride: 98 mmol/L (ref 96–106)
Creatinine, Ser: 1.51 mg/dL — ABNORMAL HIGH (ref 0.76–1.27)
Glucose: 101 mg/dL — ABNORMAL HIGH (ref 65–99)
Phosphorus: 3.4 mg/dL (ref 2.8–4.1)
Potassium: 4.4 mmol/L (ref 3.5–5.2)
Sodium: 138 mmol/L (ref 134–144)
eGFR: 57 mL/min/{1.73_m2} — ABNORMAL LOW (ref >59)

## 2020-08-05 LAB — LIPID PANEL
Chol/HDL Ratio: 5.1 ratio — ABNORMAL HIGH (ref 0.0–5.0)
Cholesterol, Total: 213 mg/dL — ABNORMAL HIGH (ref 100–199)
HDL: 42 mg/dL (ref >39)
LDL Chol Calc (NIH): 138 mg/dL — ABNORMAL HIGH (ref 0–99)
Triglycerides: 182 mg/dL — ABNORMAL HIGH (ref 0–149)
VLDL Cholesterol Cal: 33 mg/dL (ref 5–40)

## 2020-08-05 MED ORDER — ROSUVASTATIN CALCIUM 20 MG PO TABS
20.0000 mg | ORAL_TABLET | Freq: Every day | ORAL | 3 refills | Status: DC
Start: 2020-08-05 — End: 2021-05-07

## 2020-09-25 DIAGNOSIS — J301 Allergic rhinitis due to pollen: Secondary | ICD-10-CM | POA: Diagnosis not present

## 2020-09-25 DIAGNOSIS — H903 Sensorineural hearing loss, bilateral: Secondary | ICD-10-CM | POA: Diagnosis not present

## 2020-09-25 DIAGNOSIS — H6983 Other specified disorders of Eustachian tube, bilateral: Secondary | ICD-10-CM | POA: Diagnosis not present

## 2020-10-13 ENCOUNTER — Other Ambulatory Visit: Payer: Self-pay | Admitting: Medical

## 2020-10-13 NOTE — Telephone Encounter (Signed)
[name] pt needs a refill on this. Ok to refill

## 2020-11-17 ENCOUNTER — Other Ambulatory Visit: Payer: Self-pay

## 2020-11-17 ENCOUNTER — Other Ambulatory Visit: Payer: Self-pay | Admitting: Medical

## 2020-11-17 ENCOUNTER — Ambulatory Visit: Payer: 59 | Admitting: Medical

## 2020-11-17 ENCOUNTER — Ambulatory Visit
Admission: RE | Admit: 2020-11-17 | Discharge: 2020-11-17 | Disposition: A | Discharge status: Home / Self Care | Payer: 59 | Source: Ambulatory Visit | Attending: Medical | Admitting: Medical

## 2020-11-17 VITALS — BP 120/80 | HR 96 | Temp 97.7°F | Resp 16 | Wt 194.2 lb

## 2020-11-17 DIAGNOSIS — R0602 Shortness of breath: Secondary | ICD-10-CM | POA: Insufficient documentation

## 2020-11-17 DIAGNOSIS — G8929 Other chronic pain: Secondary | ICD-10-CM | POA: Diagnosis not present

## 2020-11-17 DIAGNOSIS — R0789 Other chest pain: Secondary | ICD-10-CM

## 2020-11-17 DIAGNOSIS — M25561 Pain in right knee: Secondary | ICD-10-CM | POA: Diagnosis not present

## 2020-11-17 MED ORDER — BECLOMETHASONE DIPROP HFA 80 MCG/ACT IN AERB: 1.0000 | INHALATION_SPRAY | Freq: Two times a day (BID) | RESPIRATORY_TRACT | 2 refills | Status: DC

## 2020-11-17 MED ORDER — OMEPRAZOLE 40 MG PO CPDR: 40.0000 mg | DELAYED_RELEASE_CAPSULE | Freq: Every day | ORAL | 3 refills | Status: DC

## 2020-11-17 NOTE — Patient Instructions (Signed)
Knee pain There are several possibilities causing your pain I would like to send you for an x-ray to rule out some specific causes Given that some of your tenderness is more posterior, you could even have something called a Baker's cyst If the x-ray is normal I will likely refer you to sports medicine for further evaluation In the meantime you can use rest, ice, elevation, knee sleeve over-the-counter, Tylenol for pain over-the-counter You can ice with a bag of frozen peas or ice water pack 20 minutes at a time  Chest discomfort I suspect your symptoms could be more related to either asthma flareup or acid reflux Both asthma and acid reflux can cause chest discomfort, but since you are symptoms can be worse lying down this makes me think more related to asthma or acid reflux Begin omeprazole daily in the morning 30 to 45 minutes before breakfast to help reduce stomach acid and acid reflux symptoms Avoid acidic or spicy foods for the short-term including things like pizza, spaghetti, tomato sauce, tomatoes, peppers, spicy foods Begin Qvar preventative inhaler 1 puff twice daily to reduce inflammation in the lungs and to see if this helps the chest discomfort and helps with shortness of breath The Qvar is a preventative inhaler.  Rinse mouth out with water after use.  You can still use albuterol rescue inhaler as needed for shortness of breath or wheezing  Recheck or call back within 1 week to let me know if you are seeing improvements

## 2020-11-17 NOTE — Telephone Encounter (Signed)
Pharmacy sent request to change inhaler due to not being covered by ins.

## 2020-11-17 NOTE — Progress Notes (Addendum)
Subjective: Chief Complaint  Patient presents with   Joint Swelling    Swollen knee for the last week and half . Chest pain comes and goes-2 weeks   Here for a few different concerns.    Male here for knee pain.  He has had problems with his knee on and off maybe since 3 years ago.  However in recent weeks has had more pain.  He also has some new swelling that he has had in the past.  The pain is primarily in the back of the knee.  No recent injury or trauma.  No recent activity that would worsen the pain.  He does stay busy around the house of projects but nothing that would have made him thought that would harm his knee  He notes some intermittent chest discomfort.  Hurts at night sometimes lying down.   Hurts today.  He also feels some shortness of breath on and off for the last several weeks.  He has not been using his albuterol though.  No palpitations, no edema, no sweats.  He does eat some spicy foods and sometimes gets aftertaste.  No belching.  No chest pain related to activity.  It is mainly at night when lying down.  No other aggravating or relieving factors. No other complaint.   Past Medical History:  Diagnosis Date   Asthma    Chronic back pain 2022   [place] neurosurgery   Chronic neck pain    [place] neurosurgery   Hearing impairment    wears hearing aids   History of kidney stones    History of sarcoidosis    Hypertension    Radiculopathy 2022   sees neurosurgery   Renal stone    Wears glasses    Current Outpatient Medications on File Prior to Visit  Medication Sig Dispense Refill   albuterol (VENTOLIN HFA) 108 (90 Base) MCG/ACT inhaler TAKE 2 PUFFS BY MOUTH EVERY 6 HOURS AS NEEDED FOR WHEEZE OR SHORTNESS OF BREATH 18 each 1   amLODipine (NORVASC) 10 MG tablet Take 1 tablet (10 mg total) by mouth daily. 90 tablet 3   levocetirizine (XYZAL) 5 MG tablet TAKE 1 TABLET BY MOUTH EVERY DAY IN THE EVENING 90 tablet 2   rosuvastatin (CRESTOR) 20 MG tablet Take 1 tablet  (20 mg total) by mouth daily. 90 tablet 3   sildenafil (VIAGRA) 100 MG tablet Take 0.5-1 tablets (50-100 mg total) by mouth daily as needed for erectile dysfunction. 20 tablet 5   valsartan-hydrochlorothiazide (DIOVAN-HCT) 320-12.5 MG tablet Take 1 tablet by mouth daily. 90 tablet 3   No current facility-administered medications on file prior to visit.   ROS as in subjective     Objective: BP 120/80   Pulse 96   Temp 97.7 F (36.5 C)   Resp 16   Wt 194 lb 3.2 oz (88.1 kg)   SpO2 98%   BMI 25.62 kg/m   General appearence: alert, no distress, WD/WN, African American male Skin: No bruising or redness Neck: supple, no lymphadenopathy, no thyromegaly, no masses Heart: RRR, normal S1, S2, no murmurs Lungs: CTA bilaterally, no wheezes, rhonchi, or rales Abdomen: +bs, soft, non tender, non distended, no masses, no hepatomegaly, no splenomegaly Pulses: 2+ symmetric, upper and lower extremities, normal cap refill Extremities no edema Right knee tender with lateral stress, tender posteriorly, tender with anterior drawer but no laxity, no obvious swelling, no obvious deformity otherwise.  No pain with resisted knee flexion.  Otherwise right leg unremarkable with  normal range of motion.  No tenderness with hip or ankle No calf swelling or calf asymmetry, negative Homans Legs neurovascularly intact  EKG Indication chest discomfort, rate 90 bpm, PR 150 ms, QRS 96 ms, QTC 440 ms, axis 32 degrees, normal sinus rhythm, no acute change from 2020 EKG    Assessment: Encounter Diagnoses  Name Primary?   Chronic pain of right knee Yes   Chest discomfort    SOB (shortness of breath)      Plan: Knee pain There are several possibilities causing your pain I would like to send you for an x-ray to rule out some specific causes Given that some of your tenderness is more posterior, you could even have something called a Baker's cyst If the x-ray is normal I will likely refer you to sports  medicine for further evaluation In the meantime you can use rest, ice, elevation, knee sleeve over-the-counter, Tylenol for pain over-the-counter You can ice with a bag of frozen peas or ice water pack 20 minutes at a time  Chest discomfort I suspect your symptoms could be more related to either asthma flareup or acid reflux Both asthma and acid reflux can cause chest discomfort, but since you are symptoms can be worse lying down this makes me think more related to asthma or acid reflux Begin omeprazole daily in the morning 30 to 45 minutes before breakfast to help reduce stomach acid and acid reflux symptoms Avoid acidic or spicy foods for the short-term including things like pizza, spaghetti, tomato sauce, tomatoes, peppers, spicy foods Begin Qvar preventative inhaler 1 puff twice daily to reduce inflammation in the lungs and to see if this helps the chest discomfort and helps with shortness of breath The Qvar is a preventative inhaler.  Rinse mouth out with water after use.  You can still use albuterol rescue inhaler as needed for shortness of breath or wheezing  Recheck or call back within 1 week to let me know if you are seeing improvements   Nasire was seen today for joint swelling.  Diagnoses and all orders for this visit:  Chronic pain of right knee -     DG Knee Complete 4 Views Right; Future  Chest discomfort -     EKG 12-Lead  SOB (shortness of breath)  Other orders -     omeprazole (PRILOSEC) 40 MG capsule; Take 1 capsule (40 mg total) by mouth daily. -     beclomethasone (QVAR) 80 MCG/ACT inhaler; Inhale 1 puff into the lungs 2 (two) times daily.   F/u pending xray

## 2020-11-17 NOTE — Addendum Note (Signed)
Addended by: Jac Canavan on: 11/17/2020 02:55 PM   Modules accepted: Orders

## 2020-11-18 ENCOUNTER — Other Ambulatory Visit: Payer: Self-pay | Admitting: Medical

## 2020-11-18 DIAGNOSIS — M25561 Pain in right knee: Secondary | ICD-10-CM

## 2020-11-18 DIAGNOSIS — G8929 Other chronic pain: Secondary | ICD-10-CM

## 2020-11-18 NOTE — Telephone Encounter (Signed)
Pt is on combo for HCTZ now

## 2020-11-24 ENCOUNTER — Other Ambulatory Visit: Payer: Self-pay

## 2020-11-24 ENCOUNTER — Ambulatory Visit: Payer: 59 | Admitting: Family Medicine

## 2020-11-24 VITALS — Ht 73.0 in | Wt 194.0 lb

## 2020-11-24 DIAGNOSIS — M25561 Pain in right knee: Secondary | ICD-10-CM

## 2020-11-24 MED ORDER — METHYLPREDNISOLONE ACETATE 40 MG/ML IJ SUSP
40.0000 mg | Freq: Once | INTRAMUSCULAR | Status: AC
  Administered 2020-11-24: 40 mg via INTRA_ARTICULAR

## 2020-11-24 NOTE — Progress Notes (Signed)
PCP: Jac Canavan, PA-C  Subjective:   HPI: Patient is a 45 y.o. male here for right knee pain.  PCP visit 11/17/2020 "Male here for knee pain.  He has had problems with his knee on and off maybe since 3 years ago.  However in recent weeks has had more pain.  He also has some new swelling that he has had in the past.  The pain is primarily in the back of the knee.  No recent injury or trauma.  No recent activity that would worsen the pain.  He does stay busy around the house of projects but nothing that would have made him thought that would harm his knee."  "Right knee tender with lateral stress, tender posteriorly, tender with anterior drawer but no laxity, no obvious swelling, no obvious deformity otherwise.  No pain with resisted knee flexion.  Otherwise right leg unremarkable with normal range of motion.  No tenderness with hip or ankle No calf swelling or calf asymmetry, negative Homans Legs neurovascularly intact"  X-ray knee complete 4 views right 11/17/2020 unremarkable. "Mild medial joint space narrowing is noted. No acute fracture or dislocation is seen. No soft tissue abnormality is noted."  Sports med 8/22 - 3 weeks of pain - Was lawn mowing and weed eating in his yard when the pain for started - Pain is mostly anterior medial, worse with activity such as walking or standing - Relieving factors: Rest - Ice reduces swelling but does not improve pain - Knee joint remained stable, does not feel like knee will give out on him - Sensation intact, denies pins-and-needles, denies radiating pain  Past Medical History:  Diagnosis Date   Asthma    Chronic back pain 2022   [place] neurosurgery   Chronic neck pain     neurosurgery   Hearing impairment    wears hearing aids   History of kidney stones    History of sarcoidosis    Hypertension    Radiculopathy 2022   sees neurosurgery   Renal stone    Wears glasses     Current Outpatient Medications on File  Prior to Visit  Medication Sig Dispense Refill   albuterol (VENTOLIN HFA) 108 (90 Base) MCG/ACT inhaler TAKE 2 PUFFS BY MOUTH EVERY 6 HOURS AS NEEDED FOR WHEEZE OR SHORTNESS OF BREATH 18 each 1   amLODipine (NORVASC) 10 MG tablet Take 1 tablet (10 mg total) by mouth daily. 90 tablet 3   budesonide (PULMICORT FLEXHALER) 180 MCG/ACT inhaler Inhale 2 puffs into the lungs daily. 1 each 2   levocetirizine (XYZAL) 5 MG tablet TAKE 1 TABLET BY MOUTH EVERY DAY IN THE EVENING 90 tablet 2   omeprazole (PRILOSEC) 40 MG capsule Take 1 capsule (40 mg total) by mouth daily. 30 capsule 3   rosuvastatin (CRESTOR) 20 MG tablet Take 1 tablet (20 mg total) by mouth daily. 90 tablet 3   sildenafil (VIAGRA) 100 MG tablet Take 0.5-1 tablets (50-100 mg total) by mouth daily as needed for erectile dysfunction. 20 tablet 5   valsartan-hydrochlorothiazide (DIOVAN-HCT) 320-12.5 MG tablet Take 1 tablet by mouth daily. 90 tablet 3   No current facility-administered medications on file prior to visit.    Past Surgical History:  Procedure Laterality Date   CYSTOSCOPY W/ RETROGRADES Right 08/20/2015   Procedure: CYSTOSCOPY WITH RETROGRADE PYELOGRAM;  Surgeon: Sebastian Ache, MD;  Location: [facility];  Service: Urology;  Laterality: Right;   CYSTOSCOPY WITH URETEROSCOPY AND STENT PLACEMENT Right 08/20/2015  Procedure: CYSTOSCOPY WITH URETEROSCOPY AND STENT PLACEMENT;  Surgeon: Sebastian Ache, MD;  Location: [facility];  Service: Urology;  Laterality: Right;   CYSTOSCOPY/RETROGRADE/URETEROSCOPY  01/12/2012   Procedure: CYSTOSCOPY/RETROGRADE/URETEROSCOPY;  Surgeon: Sebastian Ache, MD;  Location: [facility];  Service: Urology;  Laterality: Right;   CYSTOSCOPY/URETEROSCOPY/HOLMIUM LASER/STENT PLACEMENT Left 06/16/2016   Procedure: STAGE ONE-CYSTOSCOPY/DIAGNOSTIC URETEROSCOPY/RETROGRADE/STENT PLACEMENT;  Surgeon: Sebastian Ache, MD;  Location: [facility];  Service: Urology;  Laterality: Left;    CYSTOSCOPY/URETEROSCOPY/HOLMIUM LASER/STENT PLACEMENT Left 06/30/2016   Procedure: STAGE TWO-CYSTOSCOPY/URETEROSCOPY/HOLMIUM LASER/STENT EXCHANGE;  Surgeon: Sebastian Ache, MD;  Location[facility];  Service: Urology;  Laterality: Left;   ESOPHAGEAL DILATION     HOLMIUM LASER APPLICATION Right 08/20/2015   Procedure: HOLMIUM LASER APPLICATION;  Surgeon: Sebastian Ache, MD;  Location: [facility];  Service: Urology;  Laterality: Right;   NEPHROLITHOTOMY  02/11/2012   Procedure: NEPHROLITHOTOMY PERCUTANEOUS;  Surgeon: Sebastian Ache, MD;  Location: [facility];  Service: Urology;  Laterality: Right;   NEPHROLITHOTOMY Right 01/30/2015   Procedure: NEPHROLITHOTOMY PERCUTANEOUS, NEPHROSTOGRAM;  Surgeon: Sebastian Ache, MD;  Location: [facility];  Service: Urology;  Laterality: Right;   STONE EXTRACTION WITH BASKET Right 08/20/2015   Procedure: STONE EXTRACTION WITH BASKET;  Surgeon: Sebastian Ache, MD;  Location[facility];  Service: Urology;  Laterality: Right;   URETEROSCOPY  01/30/2015   Procedure: URETEROSCOPY WITH BASKETING;  Surgeon: Sebastian Ache, MD;  Location: [facility];  Service: Urology;;    No Known Allergies  Social History   Socioeconomic History   Marital status: Married    Spouse name: Not on file   Number of children: 4   Years of education: Not on file   Highest education level: Not on file  Occupational History   Occupation: Holiday representative  Tobacco Use   Smoking status: Former    Packs/day: 0.20    Years: 2.00    Pack years: 0.40    Types: Cigarettes    Quit date: 04/06/2007    Years since quitting: 13.6   Smokeless tobacco: Former    Types: Snuff    Quit date: 04/05/2013  Vaping Use   Vaping Use: Never used  Substance and Sexual Activity   Alcohol use: Yes    Comment: occasionally   Drug use: No   Sexual activity: Yes    Birth control/protection: None  Other Topics Concern   Not on file  Social History Narrative   Works in  Human resources officer, married, 5 children.     Social Determinants of Health   Financial Resource Strain: Not on file  Food Insecurity: Not on file  Transportation Needs: Not on file  Physical Activity: Not on file  Stress: Not on file  Social Connections: Not on file  Intimate Partner Violence: Not on file    Family History  Problem Relation Age of Onset   Hypertension Mother    Asthma Mother    Diabetes Father    Heart attack Father    Stroke Father    Heart disease Father        late 58s   Liver disease Sister    Cancer Paternal Grandmother    Stomach cancer Neg Hx    Colon cancer Neg Hx    Pancreatic cancer Neg Hx    Esophageal cancer Neg Hx     Ht 6\' 1"  (1.854 m)   Wt 194 lb (88 kg)   BMI 25.60 kg/m   No flowsheet data found.  No flowsheet data  found.  Review of Systems: See HPI above.     Objective:  Physical Exam:  Gen: NAD, comfortable in exam room  Right knee: Mild effusion.  No other gross deformity, ecchymoses. TTP medial joint line. FROM with normal strength. Negative ant/post drawers. Negative valgus/varus testing. Negative lachman. Negative mcmurrays, apleys, thessalys. NV intact distally.   Assessment & Plan:  1. Right knee pain - consistent with flare of mild arthritis vs degenerative medial meniscus though testing for this negative.  Home exercises, aleve and/or tylenol.  Steroid injection given today.  Heat or ice, compression sleeve.  F/u in 6 weeks or prn.  After informed written consent timeout was performed, patient was seated on exam table. Right knee was prepped with alcohol swab and utilizing anteromedial approach, patient's right knee was injected intraarticularly with 3:1 lidocaine: depomedrol. Patient tolerated the procedure well without immediate complications.  Fayette Pho, MD [facility]

## 2020-11-24 NOTE — Patient Instructions (Signed)
Your knee pain is either due to a flare of mild arthritis or a degenerative medial meniscus. Both are treated similarly. These are the different medications you can take for this: Tylenol 500mg  1-2 tabs three times a day for pain. Capsaicin, aspercreme, or biofreeze topically up to four times a day may also help with pain. Aleve 1-2 tabs twice a day with food as needed. Cortisone injections are an option - you were given this today. Knee sleeve for swelling if needed. It's important that you continue to stay active. Straight leg raises, knee extensions 3 sets of 10 once a day (add ankle weight if these become too easy). Consider physical therapy to strengthen muscles around the joint that hurts to take pressure off of the joint itself. Heat or ice 15 minutes at a time 3-4 times a day as needed to help with pain. Follow up with me in 6 weeks or as needed if you're doing well.

## 2020-11-25 ENCOUNTER — Encounter: Payer: Self-pay | Admitting: Family Medicine

## 2021-01-05 ENCOUNTER — Ambulatory Visit: Payer: 59 | Admitting: Family Medicine

## 2021-02-10 DIAGNOSIS — M4807 Spinal stenosis, lumbosacral region: Secondary | ICD-10-CM | POA: Diagnosis not present

## 2021-02-19 ENCOUNTER — Other Ambulatory Visit: Payer: Self-pay | Admitting: Medical

## 2021-02-20 ENCOUNTER — Other Ambulatory Visit: Payer: Self-pay | Admitting: Medical

## 2021-02-24 ENCOUNTER — Telehealth: Payer: Self-pay | Admitting: Medical

## 2021-02-24 DIAGNOSIS — M545 Low back pain, unspecified: Secondary | ICD-10-CM | POA: Diagnosis not present

## 2021-02-24 DIAGNOSIS — R200 Anesthesia of skin: Secondary | ICD-10-CM | POA: Diagnosis not present

## 2021-02-24 NOTE — Telephone Encounter (Signed)
Pt called and wants to know if you want to continue taking the omeprazole. States he was fine but didn't know if you wanted him to coniine taking it

## 2021-05-07 ENCOUNTER — Ambulatory Visit: Payer: 59 | Admitting: Medical

## 2021-05-07 ENCOUNTER — Other Ambulatory Visit: Payer: Self-pay

## 2021-05-07 ENCOUNTER — Encounter: Payer: Self-pay | Admitting: Medical

## 2021-05-07 VITALS — BP 118/78 | HR 77 | Temp 97.8°F | Ht 71.0 in | Wt 196.2 lb

## 2021-05-07 DIAGNOSIS — N182 Chronic kidney disease, stage 2 (mild): Secondary | ICD-10-CM

## 2021-05-07 DIAGNOSIS — J4520 Mild intermittent asthma, uncomplicated: Secondary | ICD-10-CM

## 2021-05-07 DIAGNOSIS — Z862 Personal history of diseases of the blood and blood-forming organs and certain disorders involving the immune mechanism: Secondary | ICD-10-CM

## 2021-05-07 DIAGNOSIS — I10 Essential (primary) hypertension: Secondary | ICD-10-CM

## 2021-05-07 DIAGNOSIS — J301 Allergic rhinitis due to pollen: Secondary | ICD-10-CM

## 2021-05-07 DIAGNOSIS — N529 Male erectile dysfunction, unspecified: Secondary | ICD-10-CM

## 2021-05-07 DIAGNOSIS — Z8249 Family history of ischemic heart disease and other diseases of the circulatory system: Secondary | ICD-10-CM

## 2021-05-07 DIAGNOSIS — Z0000 Encounter for general adult medical examination without abnormal findings: Secondary | ICD-10-CM | POA: Diagnosis not present

## 2021-05-07 DIAGNOSIS — R7301 Impaired fasting glucose: Secondary | ICD-10-CM

## 2021-05-07 DIAGNOSIS — Z974 Presence of external hearing-aid: Secondary | ICD-10-CM

## 2021-05-07 DIAGNOSIS — Z9889 Other specified postprocedural states: Secondary | ICD-10-CM

## 2021-05-07 DIAGNOSIS — E785 Hyperlipidemia, unspecified: Secondary | ICD-10-CM

## 2021-05-07 DIAGNOSIS — Z2821 Immunization not carried out because of patient refusal: Secondary | ICD-10-CM

## 2021-05-07 DIAGNOSIS — Z87442 Personal history of urinary calculi: Secondary | ICD-10-CM

## 2021-05-07 DIAGNOSIS — Z7185 Encounter for immunization safety counseling: Secondary | ICD-10-CM

## 2021-05-07 DIAGNOSIS — Z125 Encounter for screening for malignant neoplasm of prostate: Secondary | ICD-10-CM

## 2021-05-07 LAB — LIPID PANEL

## 2021-05-07 MED ORDER — ROSUVASTATIN CALCIUM 20 MG PO TABS: 20.0000 mg | ORAL_TABLET | Freq: Every day | ORAL | 3 refills | Status: DC

## 2021-05-07 NOTE — Progress Notes (Signed)
Subjective:   HPI  [name] is a 46 y.o. male who presents for Chief Complaint  Patient presents with   Annual Exam    Fasting     Patient Care Team: Laketta Soderberg, Leward Quan as PCP - General (Family Medicine) Sees dentist Sees eye doctor Dr. Magnus Sinning, spine surgeon Dr. Karlton Lemon, sports medicine Dr. Wilfrid Lund, GI Dr. Ilean Skill, physical med/pain  Dr. Alexis Frock, urology   Concerns: Hypertension-compliant with both medications valsartan HCT and amlodipine.  Hyperlipidemia-he has not been taking his Crestor.  He does not really have a good reason  He has not had any recent problems with asthma or allergies or acid reflux  He saw urology in November for follow-up on kidney stones   Reviewed their medical, surgical, family, social, medication, and allergy history and updated chart as appropriate.  Past Medical History:  Diagnosis Date   Asthma    Chronic back pain 2022   [place] [facility]   Chronic neck pain    [facility]   Hearing impairment    wears hearing aids   History of kidney stones    History of sarcoidosis    Hypertension    Radiculopathy 2022   sees neurosurgery   Renal stone    Wears glasses     Past Surgical History:  Procedure Laterality Date   CYSTOSCOPY W/ RETROGRADES Right 08/20/2015   Procedure: CYSTOSCOPY WITH RETROGRADE PYELOGRAM;  Surgeon: Alexis Frock, MD;  Location: [facility];  Service: Urology;  Laterality: Right;   CYSTOSCOPY WITH URETEROSCOPY AND STENT PLACEMENT Right 08/20/2015   Procedure: CYSTOSCOPY WITH URETEROSCOPY AND STENT PLACEMENT;  Surgeon: Alexis Frock, MD;  Location: [facility];  Service: Urology;  Laterality: Right;   CYSTOSCOPY/RETROGRADE/URETEROSCOPY  01/12/2012   Procedure: CYSTOSCOPY/RETROGRADE/URETEROSCOPY;  Surgeon: Alexis Frock, MD;  Location: [facility];  Service: Urology;  Laterality: Right;   CYSTOSCOPY/URETEROSCOPY/HOLMIUM LASER/STENT  PLACEMENT Left 06/16/2016   Procedure: STAGE ONE-CYSTOSCOPY/DIAGNOSTIC URETEROSCOPY/RETROGRADE/STENT PLACEMENT;  Surgeon: Alexis Frock, MD;  Location: [facility];  Service: Urology;  Laterality: Left;   CYSTOSCOPY/URETEROSCOPY/HOLMIUM LASER/STENT PLACEMENT Left 06/30/2016   Procedure: STAGE TWO-CYSTOSCOPY/URETEROSCOPY/HOLMIUM LASER/STENT EXCHANGE;  Surgeon: Alexis Frock, MD;  Location: [facility];  Service: Urology;  Laterality: Left;   ESOPHAGEAL DILATION     HOLMIUM LASER APPLICATION Right 0000000   Procedure: HOLMIUM LASER APPLICATION;  Surgeon: Alexis Frock, MD;  Location: [facility];  Service: Urology;  Laterality: Right;   NEPHROLITHOTOMY  02/11/2012   Procedure: NEPHROLITHOTOMY PERCUTANEOUS;  Surgeon: Alexis Frock, MD;  Location: [facility];  Service: Urology;  Laterality: Right;   NEPHROLITHOTOMY Right 01/30/2015   Procedure: NEPHROLITHOTOMY PERCUTANEOUS, NEPHROSTOGRAM;  Surgeon: Alexis Frock, MD;  Location: [facility];  Service: Urology;  Laterality: Right;   STONE EXTRACTION WITH BASKET Right 08/20/2015   Procedure: STONE EXTRACTION WITH BASKET;  Surgeon: Alexis Frock, MD;  Location: [facility];  Service: Urology;  Laterality: Right;   URETEROSCOPY  01/30/2015   Procedure: URETEROSCOPY WITH BASKETING;  Surgeon: Alexis Frock, MD;  Location: [facility];  Service: Urology;;    Family History  Problem Relation Age of Onset   Hypertension Mother    Asthma Mother    Diabetes Father    Heart attack Father    Stroke Father    Heart disease Father        late 95s   Liver disease Sister    Cancer Paternal Grandmother    Stomach cancer Neg Hx    Colon  cancer Neg Hx    Pancreatic cancer Neg Hx    Esophageal cancer Neg Hx      Current Outpatient Medications:    amLODipine (NORVASC) 10 MG tablet, Take 1 tablet (10 mg total) by mouth daily., Disp: 90 tablet, Rfl: 3   valsartan-hydrochlorothiazide (DIOVAN-HCT) 320-12.5 MG tablet, Take 1  tablet by mouth daily., Disp: 90 tablet, Rfl: 3   albuterol (VENTOLIN HFA) 108 (90 Base) MCG/ACT inhaler, TAKE 2 PUFFS BY MOUTH EVERY 6 HOURS AS NEEDED FOR WHEEZE OR SHORTNESS OF BREATH (Patient not taking: Reported on 05/07/2021), Disp: 18 each, Rfl: 1   rosuvastatin (CRESTOR) 20 MG tablet, Take 1 tablet (20 mg total) by mouth daily., Disp: 90 tablet, Rfl: 3   sildenafil (VIAGRA) 100 MG tablet, Take 0.5-1 tablets (50-100 mg total) by mouth daily as needed for erectile dysfunction. (Patient not taking: Reported on 05/07/2021), Disp: 20 tablet, Rfl: 5  No Known Allergies   Review of Systems Constitutional: -fever, -chills, -sweats, -unexpected weight change, -decreased appetite, -fatigue Allergy: -sneezing, -itching, -congestion Dermatology: -changing moles, --rash, -lumps ENT: -runny nose, -ear pain, -sore throat, -hoarseness, -sinus pain, -teeth pain, - ringing in ears, -hearing loss, -nosebleeds Cardiology: -chest pain, -palpitations, -swelling, -difficulty breathing when lying flat, -waking up short of breath Respiratory: -cough, -shortness of breath, -difficulty breathing with exercise or exertion, -wheezing, -coughing up blood Gastroenterology: -abdominal pain, -nausea, -vomiting, -diarrhea, -constipation, -blood in stool, -changes in bowel movement, -difficulty swallowing or eating Hematology: -bleeding, -bruising  Musculoskeletal: -joint aches, -muscle aches, -joint swelling, -back pain, -neck pain, -cramping, -changes in gait Ophthalmology: denies vision changes, eye redness, itching, discharge Urology: -burning with urination, -difficulty urinating, -blood in urine, -urinary frequency, -urgency, -incontinence Neurology: -headache, -weakness, -tingling, -numbness, -memory loss, -falls, -dizziness Psychology: -depressed mood, -agitation, -sleep problems Male GU: no testicular mass, pain, no lymph nodes swollen, no swelling, no rash.   Depression screen [facility] 2/9 05/07/2021 11/17/2020 05/06/2020  07/04/2018  Decreased Interest 0 0 0 0  Down, Depressed, Hopeless 0 0 0 0  PHQ - 2 Score 0 0 0 0        Objective:  BP 118/78    Pulse 77    Temp 97.8 F (36.6 C)    Ht 5\' 11"  (1.803 m)    Wt 196 lb 3.2 oz (89 kg)    SpO2 97%    BMI 27.36 kg/m   Wt Readings from Last 3 Encounters:  05/07/21 196 lb 3.2 oz (89 kg)  11/24/20 194 lb (88 kg)  11/17/20 194 lb 3.2 oz (88.1 kg)    General appearance: alert, no distress, WD/WN, African American male Skin: unremarkable HEENT: normocephalic, conjunctiva/corneas normal, sclerae anicteric, PERRLA, EOMi, nares patent, no discharge or erythema, pharynx normal Oral cavity: MMM, tongue normal, teeth normal Neck: supple, no lymphadenopathy, no thyromegaly, no masses, normal ROM, no bruits Chest: non tender, normal shape and expansion Heart: RRR, normal S1, S2, no murmurs Lungs: CTA bilaterally, no wheezes, rhonchi, or rales Abdomen: +bs, soft, non tender, non distended, no masses, no hepatomegaly, no splenomegaly, no bruits Back: lumbar surgical scar, right lower back surgical scar over flank/kidney region, otherwise non tender, normal ROM, no scoliosis Musculoskeletal: upper extremities non tender, no obvious deformity, normal ROM throughout, lower extremities non tender, no obvious deformity, normal ROM throughout Extremities: no edema, no cyanosis, no clubbing Pulses: 2+ symmetric, upper and lower extremities, normal cap refill Neurological: alert, oriented x 3, CN2-12 intact, strength normal upper extremities and lower extremities, sensation normal throughout, DTRs 2+  throughout, no cerebellar signs, gait normal Psychiatric: normal affect, behavior normal, pleasant  GU: normal male external genitalia,circumcised, nontender, no masses, no hernia, no lymphadenopathy Rectal: declined   Assessment and Plan :   Encounter Diagnoses  Name Primary?   Encounter for health maintenance examination in adult Yes   Essential hypertension, benign     Mild intermittent asthma, unspecified whether complicated    Allergic rhinitis due to pollen, unspecified seasonality    Impaired fasting glucose    CKD (chronic kidney disease) stage 2, GFR 60-89 ml/min    Uses hearing aid    Vaccine counseling    Screening for prostate cancer    History of sarcoidosis    Influenza vaccination declined    History of kidney stones    History of back surgery    Family history of heart disease    Erectile dysfunction, unspecified erectile dysfunction type    Hyperlipidemia, unspecified hyperlipidemia type     This visit was a preventative care visit, also known as wellness visit or routine physical.   Topics typically include healthy lifestyle, diet, exercise, preventative care, vaccinations, sick and well care, proper use of emergency dept and after hours care, as well as other concerns.     Recommendations: Continue to return yearly for your annual wellness and preventative care visits.  This gives [place] a chance to discuss healthy lifestyle, exercise, vaccinations, review your chart record, and perform screenings where appropriate.  I recommend you see your eye doctor yearly for routine vision care.  I recommend you see your dentist yearly for routine dental care including hygiene visits twice yearly.   Vaccination recommendations were reviewed Immunization History  Administered Date(s) Administered   Influenza Split 12/25/2010   PFIZER(Purple Top)SARS-COV-2 Vaccination 12/12/2019, 01/02/2020   PPD Test 10/21/2010   Tdap 05/06/2020    I recommended yearly flu shot I recommended pneumococcal 23 vaccine pneumonia shot I recommended COVID booster  You declined these today   Screening for cancer: Colon cancer screening: I reviewed your colonoscopy on file that is up to date from 2020.  Due repeat in 2030  We discussed PSA, prostate exam, and prostate cancer screening risks/benefits.     Skin cancer screening: Check your skin regularly for  new changes, growing lesions, or other lesions of concern Come in for evaluation if you have skin lesions of concern.  Lung cancer screening: If you have a greater than 20 pack year history of tobacco use, then you may qualify for lung cancer screening with a chest CT scan.   Please call your insurance company to inquire about coverage for this test.  We currently don't have screenings for other cancers besides breast, cervical, colon, and lung cancers.  If you have a strong family history of cancer or have other cancer screening concerns, please let me know.    Bone health: Get at least 150 minutes of aerobic exercise weekly Get weight bearing exercise at least once weekly Bone density test:  A bone density test is an imaging test that uses a type of X-ray to measure the amount of calcium and other minerals in your bones. The test may be used to diagnose or screen you for a condition that causes weak or thin bones (osteoporosis), predict your risk for a broken bone (fracture), or determine how well your osteoporosis treatment is working. The bone density test is recommended for females 53 and older, or females or males [id number] if certain risk factors such as thyroid disease, long term  use of steroids such as for asthma or rheumatological issues, vitamin D deficiency, estrogen deficiency, family history of osteoporosis, self or family history of fragility fracture in first degree relative.    Heart health: Get at least 150 minutes of aerobic exercise weekly Limit alcohol It is important to maintain a healthy blood pressure and healthy cholesterol numbers  Heart disease screening: Screening for heart disease includes screening for blood pressure, fasting lipids, glucose/diabetes screening, BMI height to weight ratio, reviewed of smoking status, physical activity, and diet.    Goals include blood pressure 120/80 or less, maintaining a healthy lipid/cholesterol profile, preventing diabetes or  keeping diabetes numbers under good control, not smoking or using tobacco products, exercising most days per week or at least 150 minutes per week of exercise, and eating healthy variety of fruits and vegetables, healthy oils, and avoiding unhealthy food choices like fried food, fast food, high sugar and high cholesterol foods.    Other tests may possibly include EKG test, CT coronary calcium score, echocardiogram, exercise treadmill stress test.     Medical care options: I recommend you continue to seek care here first for routine care.  We try really hard to have available appointments Monday through Friday daytime hours for sick visits, acute visits, and physicals.  Urgent care should be used for after hours and weekends for significant issues that cannot wait till the next day.  The emergency department should be used for significant potentially life-threatening emergencies.  The emergency department is expensive, can often have long wait times for less significant concerns, so try to utilize primary care, urgent care, or telemedicine when possible to avoid unnecessary trips to the emergency department.  Virtual visits and telemedicine have been introduced since the pandemic started in 2020, and can be convenient ways to receive medical care.  We offer virtual appointments as well to assist you in a variety of options to seek medical care.   Advanced Directives: I recommend you consider completing a [place] and Living Will.   These documents respect your wishes and help alleviate burdens on your loved ones if you were to become terminally ill or be in a position to need those documents enforced.    You can complete Advanced Directives yourself, have them notarized, then have copies made for our office, for you and for anybody you feel should have them in safe keeping.  Or, you can have an attorney prepare these documents.   If you haven't updated your Last Will and Testament  in a while, it may be worthwhile having an attorney prepare these documents together and save on some costs.       Separate significant issues discussed: Hypertension-continue current medication  Hyperlipidemia-noncompliant  Allergies and asthma-no recent problems  GERD-no recent issues  Traci was seen today for annual exam.  Diagnoses and all orders for this visit:  Encounter for health maintenance examination in adult -     Renal Function Panel -     VITAMIN D 25 Hydroxy (Vit-D Deficiency, Fractures) -     CBC -     Lipid panel -     PSA -     Hepatic function panel -     Cancel: POCT Urinalysis DIP (Proadvantage Device)  Essential hypertension, benign -     Renal Function Panel -     Hepatic function panel -     Cancel: POCT Urinalysis DIP (Proadvantage Device)  Mild intermittent asthma, unspecified whether complicated  Allergic  rhinitis due to pollen, unspecified seasonality  Impaired fasting glucose  CKD (chronic kidney disease) stage 2, GFR 60-89 ml/min -     Renal Function Panel -     VITAMIN D 25 Hydroxy (Vit-D Deficiency, Fractures) -     CBC  Uses hearing aid  Vaccine counseling  Screening for prostate cancer -     PSA  History of sarcoidosis  Influenza vaccination declined  History of kidney stones  History of back surgery  Family history of heart disease  Erectile dysfunction, unspecified erectile dysfunction type  Hyperlipidemia, unspecified hyperlipidemia type -     Lipid panel  Other orders -     rosuvastatin (CRESTOR) 20 MG tablet; Take 1 tablet (20 mg total) by mouth daily.    Follow-up pending labs, yearly for physical

## 2021-05-07 NOTE — Patient Instructions (Signed)
This visit was a preventative care visit, also known as wellness visit or routine physical.   Topics typically include healthy lifestyle, diet, exercise, preventative care, vaccinations, sick and well care, proper use of emergency dept and after hours care, as well as other concerns.     Recommendations: Continue to return yearly for your annual wellness and preventative care visits.  This gives [place] a chance to discuss healthy lifestyle, exercise, vaccinations, review your chart record, and perform screenings where appropriate.  I recommend you see your eye doctor yearly for routine vision care.  I recommend you see your dentist yearly for routine dental care including hygiene visits twice yearly.   Vaccination recommendations were reviewed Immunization History  Administered Date(s) Administered   Influenza Split 12/25/2010   PFIZER(Purple Top)SARS-COV-2 Vaccination 12/12/2019, 01/02/2020   PPD Test 10/21/2010   Tdap 05/06/2020    I recommended yearly flu shot I recommended pneumococcal 23 vaccine pneumonia shot I recommended COVID booster  You declined these today   Screening for cancer: Colon cancer screening: I reviewed your colonoscopy on file that is up to date from 2020.  Due repeat in 2030  We discussed PSA, prostate exam, and prostate cancer screening risks/benefits.     Skin cancer screening: Check your skin regularly for new changes, growing lesions, or other lesions of concern Come in for evaluation if you have skin lesions of concern.  Lung cancer screening: If you have a greater than 20 pack year history of tobacco use, then you may qualify for lung cancer screening with a chest CT scan.   Please call your insurance company to inquire about coverage for this test.  We currently don't have screenings for other cancers besides breast, cervical, colon, and lung cancers.  If you have a strong family history of cancer or have other cancer screening concerns, please  let me know.    Bone health: Get at least 150 minutes of aerobic exercise weekly Get weight bearing exercise at least once weekly Bone density test:  A bone density test is an imaging test that uses a type of X-ray to measure the amount of calcium and other minerals in your bones. The test may be used to diagnose or screen you for a condition that causes weak or thin bones (osteoporosis), predict your risk for a broken bone (fracture), or determine how well your osteoporosis treatment is working. The bone density test is recommended for females 35 and older, or females or males [id number] if certain risk factors such as thyroid disease, long term use of steroids such as for asthma or rheumatological issues, vitamin D deficiency, estrogen deficiency, family history of osteoporosis, self or family history of fragility fracture in first degree relative.    Heart health: Get at least 150 minutes of aerobic exercise weekly Limit alcohol It is important to maintain a healthy blood pressure and healthy cholesterol numbers  Heart disease screening: Screening for heart disease includes screening for blood pressure, fasting lipids, glucose/diabetes screening, BMI height to weight ratio, reviewed of smoking status, physical activity, and diet.    Goals include blood pressure 120/80 or less, maintaining a healthy lipid/cholesterol profile, preventing diabetes or keeping diabetes numbers under good control, not smoking or using tobacco products, exercising most days per week or at least 150 minutes per week of exercise, and eating healthy variety of fruits and vegetables, healthy oils, and avoiding unhealthy food choices like fried food, fast food, high sugar and high cholesterol foods.    Other tests  may possibly include EKG test, CT coronary calcium score, echocardiogram, exercise treadmill stress test.     Medical care options: I recommend you continue to seek care here first for routine care.  We try  really hard to have available appointments Monday through Friday daytime hours for sick visits, acute visits, and physicals.  Urgent care should be used for after hours and weekends for significant issues that cannot wait till the next day.  The emergency department should be used for significant potentially life-threatening emergencies.  The emergency department is expensive, can often have long wait times for less significant concerns, so try to utilize primary care, urgent care, or telemedicine when possible to avoid unnecessary trips to the emergency department.  Virtual visits and telemedicine have been introduced since the pandemic started in 2020, and can be convenient ways to receive medical care.  We offer virtual appointments as well to assist you in a variety of options to seek medical care.   Advanced Directives: I recommend you consider completing a [place] and Living Will.   These documents respect your wishes and help alleviate burdens on your loved ones if you were to become terminally ill or be in a position to need those documents enforced.    You can complete Advanced Directives yourself, have them notarized, then have copies made for our office, for you and for anybody you feel should have them in safe keeping.  Or, you can have an attorney prepare these documents.   If you haven't updated your Last Will and Testament in a while, it may be worthwhile having an attorney prepare these documents together and save on some costs.       Separate significant issues discussed: Hypertension-continue current medication  Hyperlipidemia-noncompliant .  Restart Crestor.  Allergies and asthma-no recent problems  GERD-no recent issues

## 2021-05-08 ENCOUNTER — Other Ambulatory Visit: Payer: Self-pay | Admitting: Medical

## 2021-05-08 LAB — HEPATIC FUNCTION PANEL
ALT: 25 IU/L (ref 0–44)
AST: 27 IU/L (ref 0–40)
Alkaline Phosphatase: 67 IU/L (ref 44–121)
Bilirubin Total: 0.4 mg/dL (ref 0.0–1.2)
Bilirubin, Direct: 0.12 mg/dL (ref 0.00–0.40)
Total Protein: 7.4 g/dL (ref 6.0–8.5)

## 2021-05-08 LAB — LIPID PANEL
Chol/HDL Ratio: 3 ratio (ref 0.0–5.0)
Cholesterol, Total: 119 mg/dL (ref 100–199)
HDL: 40 mg/dL (ref >39)
LDL Chol Calc (NIH): 59 mg/dL (ref 0–99)
Triglycerides: 105 mg/dL (ref 0–149)
VLDL Cholesterol Cal: 20 mg/dL (ref 5–40)

## 2021-05-08 LAB — RENAL FUNCTION PANEL
Albumin: 4.9 g/dL (ref 4.0–5.0)
BUN/Creatinine Ratio: 10 (ref 9–20)
BUN: 14 mg/dL (ref 6–24)
CO2: 24 mmol/L (ref 20–29)
Calcium: 10.1 mg/dL (ref 8.7–10.2)
Chloride: 102 mmol/L (ref 96–106)
Creatinine, Ser: 1.35 mg/dL — ABNORMAL HIGH (ref 0.76–1.27)
Glucose: 103 mg/dL — ABNORMAL HIGH (ref 70–99)
Phosphorus: 3.1 mg/dL (ref 2.8–4.1)
Potassium: 4.1 mmol/L (ref 3.5–5.2)
Sodium: 141 mmol/L (ref 134–144)
eGFR: 65 mL/min/{1.73_m2} (ref >59)

## 2021-05-08 LAB — CBC
Hematocrit: 42.3 % (ref 37.5–51.0)
Hemoglobin: 15.2 g/dL (ref 13.0–17.7)
MCH: 30.8 pg (ref 26.6–33.0)
MCHC: 35.9 g/dL — ABNORMAL HIGH (ref 31.5–35.7)
MCV: 86 fL (ref 79–97)
Platelets: 324 10*3/uL (ref 150–450)
RBC: 4.93 x10E6/uL (ref 4.14–5.80)
RDW: 12.2 % (ref 11.6–15.4)
WBC: 5.3 10*3/uL (ref 3.4–10.8)

## 2021-05-08 LAB — PSA: Prostate Specific Ag, Serum: 1.1 ng/mL (ref 0.0–4.0)

## 2021-05-08 LAB — VITAMIN D 25 HYDROXY (VIT D DEFICIENCY, FRACTURES): Vit D, 25-Hydroxy: 11 ng/mL — ABNORMAL LOW (ref 30.0–100.0)

## 2021-05-08 MED ORDER — VALSARTAN-HYDROCHLOROTHIAZIDE 320-12.5 MG PO TABS: 1.0000 | ORAL_TABLET | Freq: Every day | ORAL | 3 refills | Status: DC

## 2021-05-08 MED ORDER — AMLODIPINE BESYLATE 10 MG PO TABS: 10.0000 mg | ORAL_TABLET | Freq: Every day | ORAL | 3 refills | Status: DC

## 2021-05-08 MED ORDER — VITAMIN D 25 MCG (1000 UNIT) PO TABS: 1000.0000 [IU] | ORAL_TABLET | Freq: Every day | ORAL | 3 refills | Status: DC

## 2021-06-05 ENCOUNTER — Other Ambulatory Visit: Payer: Self-pay | Admitting: Otolaryngology

## 2021-06-05 DIAGNOSIS — H9120 Sudden idiopathic hearing loss, unspecified ear: Secondary | ICD-10-CM

## 2021-06-20 ENCOUNTER — Other Ambulatory Visit: Payer: Self-pay

## 2021-06-20 ENCOUNTER — Ambulatory Visit
Admission: RE | Admit: 2021-06-20 | Discharge: 2021-06-20 | Disposition: A | Discharge status: Home / Self Care | Payer: 59 | Source: Ambulatory Visit | Attending: Otolaryngology | Admitting: Otolaryngology

## 2021-06-20 DIAGNOSIS — H9120 Sudden idiopathic hearing loss, unspecified ear: Secondary | ICD-10-CM

## 2021-06-20 MED ORDER — GADOBENATE DIMEGLUMINE 529 MG/ML IV SOLN
15.0000 mL | Freq: Once | INTRAVENOUS | Status: AC | PRN
  Administered 2021-06-20: 15 mL via INTRAVENOUS

## 2021-08-11 ENCOUNTER — Other Ambulatory Visit: Payer: Self-pay | Admitting: Medical

## 2021-12-08 DIAGNOSIS — M47816 Spondylosis without myelopathy or radiculopathy, lumbar region: Secondary | ICD-10-CM | POA: Diagnosis not present

## 2021-12-09 ENCOUNTER — Encounter: Payer: Self-pay | Admitting: Internal Medicine

## 2021-12-31 DIAGNOSIS — M47816 Spondylosis without myelopathy or radiculopathy, lumbar region: Secondary | ICD-10-CM | POA: Diagnosis not present

## 2022-01-12 ENCOUNTER — Encounter: Payer: Self-pay | Admitting: Internal Medicine

## 2022-02-04 DIAGNOSIS — M47816 Spondylosis without myelopathy or radiculopathy, lumbar region: Secondary | ICD-10-CM | POA: Diagnosis not present

## 2022-02-04 DIAGNOSIS — Z6825 Body mass index (BMI) 25.0-25.9, adult: Secondary | ICD-10-CM | POA: Diagnosis not present

## 2022-02-11 ENCOUNTER — Encounter: Payer: 59 | Admitting: Medical

## 2022-03-09 ENCOUNTER — Encounter: Payer: Self-pay | Admitting: Medical

## 2022-03-09 ENCOUNTER — Ambulatory Visit (INDEPENDENT_AMBULATORY_CARE_PROVIDER_SITE_OTHER): Payer: 59 | Admitting: Medical

## 2022-03-09 VITALS — BP 126/80 | HR 76 | Ht 72.0 in | Wt 194.8 lb

## 2022-03-09 DIAGNOSIS — I10 Essential (primary) hypertension: Secondary | ICD-10-CM

## 2022-03-09 DIAGNOSIS — J4520 Mild intermittent asthma, uncomplicated: Secondary | ICD-10-CM | POA: Diagnosis not present

## 2022-03-09 DIAGNOSIS — E559 Vitamin D deficiency, unspecified: Secondary | ICD-10-CM

## 2022-03-09 DIAGNOSIS — Z833 Family history of diabetes mellitus: Secondary | ICD-10-CM

## 2022-03-09 DIAGNOSIS — Z131 Encounter for screening for diabetes mellitus: Secondary | ICD-10-CM

## 2022-03-09 DIAGNOSIS — N182 Chronic kidney disease, stage 2 (mild): Secondary | ICD-10-CM

## 2022-03-09 DIAGNOSIS — Z2821 Immunization not carried out because of patient refusal: Secondary | ICD-10-CM

## 2022-03-09 DIAGNOSIS — E785 Hyperlipidemia, unspecified: Secondary | ICD-10-CM

## 2022-03-09 LAB — POCT URINALYSIS DIP (PROADVANTAGE DEVICE)
Bilirubin, UA: NEGATIVE
Blood, UA: NEGATIVE
Glucose, UA: NEGATIVE mg/dL
Ketones, POC UA: NEGATIVE mg/dL
Leukocytes, UA: NEGATIVE
Nitrite, UA: NEGATIVE
Protein Ur, POC: NEGATIVE mg/dL
Specific Gravity, Urine: 1.015
Urobilinogen, Ur: 0.2
pH, UA: 6 (ref 5.0–8.0)

## 2022-03-09 MED ORDER — VITAMIN D 50 MCG (2000 UT) PO CAPS: 1.0000 | ORAL_CAPSULE | Freq: Every day | ORAL | 3 refills | Status: DC

## 2022-03-09 NOTE — Progress Notes (Unsigned)
Subjective:  [name] is a 46 y.o. male who presents for Chief Complaint  Patient presents with   Hypertension    Fasting med check, only had black coffee this morning. No new concerns.      Here for routine med check.  Blood pressure-compliant with medication without complaint.  No chest pain, palpitations, edema  Hyperlipidemia-compliant with Crestor without complaint  Vitamin D deficiency-he never started vitamin D supplement after recommendation earlier in the year  He does get a good amount of fish in the diet but not so much sun.  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  No recent problems with asthma.  He also screening for diabetes.  His brother just got diagnosed with diabetes and there is a strong family history of diabetes.  No other aggravating or relieving factors.    No other c/o.  Past Medical History:  Diagnosis Date   Asthma    Chronic back pain 2022   [place] [facility]   Chronic neck pain    [place] [facility]   Hearing impairment    wears hearing aids   History of kidney stones    History of sarcoidosis    Hypertension    Radiculopathy 2022   sees neurosurgery   Renal stone    Wears glasses    Current Outpatient Medications on File Prior to Visit  Medication Sig Dispense Refill   amLODipine (NORVASC) 10 MG tablet Take 1 tablet (10 mg total) by mouth daily. 90 tablet 3   rosuvastatin (CRESTOR) 20 MG tablet Take 1 tablet (20 mg total) by mouth daily. 90 tablet 3   valsartan-hydrochlorothiazide (DIOVAN-HCT) 320-12.5 MG tablet Take 1 tablet by mouth daily. 90 tablet 3   albuterol (VENTOLIN HFA) 108 (90 Base) MCG/ACT inhaler TAKE 2 PUFFS BY MOUTH EVERY 6 HOURS AS NEEDED FOR WHEEZE OR SHORTNESS OF BREATH (Patient not taking: Reported on 05/07/2021) 18 each 1   sildenafil (VIAGRA) 100 MG tablet Take 0.5-1 tablets (50-100 mg total) by mouth daily as needed for erectile dysfunction. (Patient  not taking: Reported on 05/07/2021) 20 tablet 5   No current facility-administered medications on file prior to visit.     The following portions of the patient's history were reviewed and updated as appropriate: allergies, current medications, past family history, past medical history, past social history, past surgical history and problem list.  ROS Otherwise as in subjective above     Objective: BP 126/80   Pulse 76   Ht 6' (1.829 m)   Wt 194 lb 12.8 oz (88.4 kg)   BMI 26.42 kg/m   Wt Readings from Last 3 Encounters:  03/09/22 194 lb 12.8 oz (88.4 kg)  05/07/21 196 lb 3.2 oz (89 kg)  11/24/20 194 lb (88 kg)   BP Readings from Last 3 Encounters:  03/09/22 126/80  05/07/21 118/78  11/17/20 120/80    General appearance: alert, no distress, well developed, well nourished Neck: supple, no lymphadenopathy, no thyromegaly, no masses Heart: RRR, normal S1, S2, no murmurs Lungs: CTA bilaterally, no wheezes, rhonchi, or rales Pulses: 2+ radial pulses, 2+ pedal pulses, normal cap refill Ext: no edema    Assessment: Encounter Diagnoses  Name Primary?   Essential hypertension, benign Yes   Mild intermittent asthma, unspecified whether complicated    CKD (chronic kidney disease) stage 2, GFR 60-89 ml/min    Influenza vaccination declined    Hyperlipidemia, unspecified hyperlipidemia type  Screening for diabetes mellitus    Vitamin D deficiency    Family history of diabetes mellitus      Plan: Hypertension-blood pressure at goal, continue amlodipine 10 mg daily, valsartan HCT 320/12.5 mg daily  CKD 2-updated labs today continue good hydration.  Hyperlipidemia-I reviewed last lipids that is less than-year-old, continue Crestor  Vitamin D deficiency-he never started vitamin D supplement earlier this year.  Begin vitamin D supplement  Labs to screen for diabetes today.    Asthma-no recent concerns  [name] was seen today for hypertension.  Diagnoses and all  orders for this visit:  Essential hypertension, benign -     Basic metabolic panel  Mild intermittent asthma, unspecified whether complicated  CKD (chronic kidney disease) stage 2, GFR 60-89 ml/min -     Basic metabolic panel -     POCT Urinalysis DIP (Proadvantage Device)  Influenza vaccination declined  Hyperlipidemia, unspecified hyperlipidemia type  Screening for diabetes mellitus -     Hemoglobin A1c  Vitamin D deficiency  Family history of diabetes mellitus  Other orders -     Cholecalciferol (VITAMIN D) 50 MCG (2000 UT) CAPS; Take 1 capsule (2,000 Units total) by mouth daily.     Follow up: pending labs

## 2022-03-10 ENCOUNTER — Other Ambulatory Visit: Payer: Self-pay | Admitting: Medical

## 2022-03-10 LAB — BASIC METABOLIC PANEL
BUN/Creatinine Ratio: 12 (ref 9–20)
BUN: 16 mg/dL (ref 6–24)
CO2: 24 mmol/L (ref 20–29)
Calcium: 10.4 mg/dL — ABNORMAL HIGH (ref 8.7–10.2)
Chloride: 100 mmol/L (ref 96–106)
Creatinine, Ser: 1.36 mg/dL — ABNORMAL HIGH (ref 0.76–1.27)
Glucose: 89 mg/dL (ref 70–99)
Potassium: 4.6 mmol/L (ref 3.5–5.2)
Sodium: 140 mmol/L (ref 134–144)
eGFR: 64 mL/min/{1.73_m2} (ref >59)

## 2022-03-10 LAB — HEMOGLOBIN A1C
Est. average glucose Bld gHb Est-mCnc: 117 mg/dL
Hgb A1c MFr Bld: 5.7 % — ABNORMAL HIGH (ref 4.8–5.6)

## 2022-03-10 MED ORDER — ALBUTEROL SULFATE HFA 108 (90 BASE) MCG/ACT IN AERS: INHALATION_SPRAY | RESPIRATORY_TRACT | 1 refills | Status: DC

## 2022-03-19 ENCOUNTER — Other Ambulatory Visit: Payer: Self-pay | Admitting: Medical

## 2022-05-28 ENCOUNTER — Other Ambulatory Visit: Payer: Self-pay | Admitting: Medical

## 2022-05-30 ENCOUNTER — Other Ambulatory Visit: Payer: Self-pay | Admitting: Medical

## 2022-05-31 ENCOUNTER — Encounter: Payer: 59 | Admitting: Medical

## 2022-08-25 ENCOUNTER — Other Ambulatory Visit: Payer: Self-pay | Admitting: Medical

## 2022-09-09 ENCOUNTER — Encounter: Payer: 59 | Admitting: Medical

## 2022-09-09 ENCOUNTER — Telehealth: Payer: Self-pay | Admitting: Medical

## 2022-09-09 NOTE — Telephone Encounter (Signed)

## 2022-09-10 ENCOUNTER — Encounter: Payer: Self-pay | Admitting: Medical

## 2023-02-21 ENCOUNTER — Other Ambulatory Visit: Payer: Self-pay | Admitting: Medical

## 2023-02-22 ENCOUNTER — Encounter: Payer: Self-pay | Admitting: Internal Medicine

## 2023-03-11 ENCOUNTER — Other Ambulatory Visit: Payer: Self-pay | Admitting: Neurosurgery

## 2023-03-11 DIAGNOSIS — M5440 Lumbago with sciatica, unspecified side: Secondary | ICD-10-CM

## 2023-03-25 ENCOUNTER — Other Ambulatory Visit: Payer: Self-pay | Admitting: Medical

## 2023-03-25 NOTE — Telephone Encounter (Signed)
Pt states he does not need a refill. Pt has an appt on 12/30

## 2023-04-01 ENCOUNTER — Ambulatory Visit
Admission: RE | Admit: 2023-04-01 | Discharge: 2023-04-01 | Disposition: A | Discharge status: Home / Self Care | Payer: 59 | Source: Ambulatory Visit | Attending: Neurosurgery | Admitting: Neurosurgery

## 2023-04-01 DIAGNOSIS — M5440 Lumbago with sciatica, unspecified side: Secondary | ICD-10-CM

## 2023-04-04 ENCOUNTER — Encounter: Payer: Self-pay | Admitting: Medical

## 2023-04-04 ENCOUNTER — Ambulatory Visit: Payer: Self-pay | Admitting: Medical

## 2023-04-04 VITALS — BP 120/70 | HR 105 | Wt 198.8 lb

## 2023-04-04 DIAGNOSIS — Z125 Encounter for screening for malignant neoplasm of prostate: Secondary | ICD-10-CM

## 2023-04-04 DIAGNOSIS — R7301 Impaired fasting glucose: Secondary | ICD-10-CM | POA: Insufficient documentation

## 2023-04-04 DIAGNOSIS — Z1389 Encounter for screening for other disorder: Secondary | ICD-10-CM

## 2023-04-04 DIAGNOSIS — Z113 Encounter for screening for infections with a predominantly sexual mode of transmission: Secondary | ICD-10-CM

## 2023-04-04 DIAGNOSIS — Z0000 Encounter for general adult medical examination without abnormal findings: Secondary | ICD-10-CM

## 2023-04-04 LAB — POCT URINALYSIS DIP (PROADVANTAGE DEVICE)
Bilirubin, UA: NEGATIVE
Blood, UA: NEGATIVE
Glucose, UA: NEGATIVE mg/dL
Ketones, POC UA: NEGATIVE mg/dL
Nitrite, UA: NEGATIVE
Protein Ur, POC: NEGATIVE mg/dL
Specific Gravity, Urine: 1.015
Urobilinogen, Ur: NEGATIVE
pH, UA: 6.5 (ref 5.0–8.0)

## 2023-04-04 LAB — LIPID PANEL

## 2023-04-05 ENCOUNTER — Other Ambulatory Visit: Payer: Self-pay | Admitting: Medical

## 2023-04-05 DIAGNOSIS — R7989 Other specified abnormal findings of blood chemistry: Secondary | ICD-10-CM

## 2023-04-05 MED ORDER — AMLODIPINE BESYLATE 10 MG PO TABS: 10.0000 mg | ORAL_TABLET | Freq: Every day | ORAL | 3 refills | Status: AC

## 2023-04-05 MED ORDER — VALSARTAN-HYDROCHLOROTHIAZIDE 320-12.5 MG PO TABS: 1.0000 | ORAL_TABLET | Freq: Every day | ORAL | 3 refills | Status: DC

## 2023-04-05 MED ORDER — ROSUVASTATIN CALCIUM 20 MG PO TABS: 20.0000 mg | ORAL_TABLET | Freq: Every day | ORAL | 3 refills | Status: DC

## 2023-04-05 NOTE — Progress Notes (Signed)
Results sent through MyChart

## 2023-04-12 LAB — HEPATITIS C ANTIBODY: Hep C Virus Ab: NONREACTIVE

## 2023-04-12 LAB — HEMOGLOBIN A1C
Est. average glucose Bld gHb Est-mCnc: 120 mg/dL
Hgb A1c MFr Bld: 5.8 % — ABNORMAL HIGH (ref 4.8–5.6)

## 2023-04-12 LAB — LIPID PANEL
Chol/HDL Ratio: 4.1 {ratio} (ref 0.0–5.0)
Cholesterol, Total: 175 mg/dL (ref 100–199)
HDL: 43 mg/dL (ref >39)
LDL Chol Calc (NIH): 108 mg/dL — ABNORMAL HIGH (ref 0–99)
Triglycerides: 132 mg/dL (ref 0–149)
VLDL Cholesterol Cal: 24 mg/dL (ref 5–40)

## 2023-04-12 LAB — CBC
Hematocrit: 46.6 % (ref 37.5–51.0)
Hemoglobin: 16.4 g/dL (ref 13.0–17.7)
MCH: 30.3 pg (ref 26.6–33.0)
MCHC: 35.2 g/dL (ref 31.5–35.7)
MCV: 86 fL (ref 79–97)
Platelets: 400 10*3/uL (ref 150–450)
RBC: 5.41 x10E6/uL (ref 4.14–5.80)
RDW: 12.6 % (ref 11.6–15.4)
WBC: 7 10*3/uL (ref 3.4–10.8)

## 2023-04-12 LAB — COMPREHENSIVE METABOLIC PANEL
ALT: 54 [IU]/L — ABNORMAL HIGH (ref 0–44)
AST: 41 [IU]/L — ABNORMAL HIGH (ref 0–40)
Albumin: 5 g/dL (ref 4.1–5.1)
Alkaline Phosphatase: 74 [IU]/L (ref 44–121)
BUN/Creatinine Ratio: 13 (ref 9–20)
BUN: 20 mg/dL (ref 6–24)
Bilirubin Total: 0.7 mg/dL (ref 0.0–1.2)
CO2: 24 mmol/L (ref 20–29)
Calcium: 10.8 mg/dL — ABNORMAL HIGH (ref 8.7–10.2)
Chloride: 97 mmol/L (ref 96–106)
Creatinine, Ser: 1.5 mg/dL — ABNORMAL HIGH (ref 0.76–1.27)
Globulin, Total: 3.2 g/dL (ref 1.5–4.5)
Glucose: 102 mg/dL — ABNORMAL HIGH (ref 70–99)
Potassium: 4.2 mmol/L (ref 3.5–5.2)
Sodium: 137 mmol/L (ref 134–144)
Total Protein: 8.2 g/dL (ref 6.0–8.5)
eGFR: 57 mL/min/{1.73_m2} — ABNORMAL LOW (ref >59)

## 2023-04-12 LAB — PSA: Prostate Specific Ag, Serum: 1.3 ng/mL (ref 0.0–4.0)

## 2023-04-12 LAB — RPR+HIV+GC+CT PANEL
Chlamydia trachomatis, NAA: NEGATIVE
HIV Screen 4th Generation wRfx: NONREACTIVE
Neisseria Gonorrhoeae by PCR: NEGATIVE

## 2023-04-20 ENCOUNTER — Other Ambulatory Visit: Payer: Self-pay | Admitting: Medical

## 2023-04-20 MED ORDER — HYDROCORTISONE ACETATE 25 MG RE SUPP: 25.0000 mg | Freq: Two times a day (BID) | RECTAL | 0 refills | Status: DC

## 2023-06-03 DIAGNOSIS — M5440 Lumbago with sciatica, unspecified side: Secondary | ICD-10-CM | POA: Diagnosis not present

## 2023-06-16 DIAGNOSIS — M47816 Spondylosis without myelopathy or radiculopathy, lumbar region: Secondary | ICD-10-CM | POA: Diagnosis not present

## 2023-07-14 DIAGNOSIS — M5440 Lumbago with sciatica, unspecified side: Secondary | ICD-10-CM | POA: Diagnosis not present

## 2023-07-22 DIAGNOSIS — I10 Essential (primary) hypertension: Secondary | ICD-10-CM | POA: Diagnosis not present

## 2023-07-28 DIAGNOSIS — M47816 Spondylosis without myelopathy or radiculopathy, lumbar region: Secondary | ICD-10-CM | POA: Diagnosis not present

## 2023-08-11 DIAGNOSIS — M47816 Spondylosis without myelopathy or radiculopathy, lumbar region: Secondary | ICD-10-CM | POA: Diagnosis not present

## 2023-08-15 DIAGNOSIS — H9192 Unspecified hearing loss, left ear: Secondary | ICD-10-CM | POA: Diagnosis not present

## 2023-09-11 DIAGNOSIS — I10 Essential (primary) hypertension: Secondary | ICD-10-CM | POA: Diagnosis not present

## 2023-09-11 DIAGNOSIS — F419 Anxiety disorder, unspecified: Secondary | ICD-10-CM | POA: Diagnosis not present

## 2023-09-11 DIAGNOSIS — E785 Hyperlipidemia, unspecified: Secondary | ICD-10-CM | POA: Diagnosis not present

## 2023-10-20 DIAGNOSIS — R030 Elevated blood-pressure reading, without diagnosis of hypertension: Secondary | ICD-10-CM | POA: Diagnosis not present

## 2023-10-20 DIAGNOSIS — M4807 Spinal stenosis, lumbosacral region: Secondary | ICD-10-CM | POA: Diagnosis not present

## 2023-10-20 DIAGNOSIS — M47816 Spondylosis without myelopathy or radiculopathy, lumbar region: Secondary | ICD-10-CM | POA: Diagnosis not present

## 2023-10-20 DIAGNOSIS — Z6825 Body mass index (BMI) 25.0-25.9, adult: Secondary | ICD-10-CM | POA: Diagnosis not present

## 2023-11-01 ENCOUNTER — Ambulatory Visit (INDEPENDENT_AMBULATORY_CARE_PROVIDER_SITE_OTHER): Admitting: Medical

## 2023-11-01 VITALS — BP 124/80 | HR 71 | Temp 97.6°F | Wt 200.0 lb

## 2023-11-01 DIAGNOSIS — R350 Frequency of micturition: Secondary | ICD-10-CM | POA: Diagnosis not present

## 2023-11-01 DIAGNOSIS — R7989 Other specified abnormal findings of blood chemistry: Secondary | ICD-10-CM | POA: Diagnosis not present

## 2023-11-01 DIAGNOSIS — N182 Chronic kidney disease, stage 2 (mild): Secondary | ICD-10-CM | POA: Diagnosis not present

## 2023-11-01 LAB — POCT URINALYSIS DIP (PROADVANTAGE DEVICE)
Bilirubin, UA: NEGATIVE
Blood, UA: NEGATIVE
Glucose, UA: NEGATIVE mg/dL
Ketones, POC UA: NEGATIVE mg/dL
Nitrite, UA: NEGATIVE
Protein Ur, POC: NEGATIVE mg/dL
Specific Gravity, Urine: 1.025
Urobilinogen, Ur: NEGATIVE
pH, UA: 6 (ref 5.0–8.0)

## 2023-11-01 MED ORDER — EPINEPHRINE 0.3 MG/0.3ML IJ SOAJ: 0.3000 mg | INTRAMUSCULAR | 0 refills | Status: AC | PRN

## 2023-11-01 MED ORDER — TAMSULOSIN HCL 0.4 MG PO CAPS: 0.4000 mg | ORAL_CAPSULE | Freq: Every day | ORAL | 1 refills | Status: DC

## 2023-11-01 NOTE — Progress Notes (Signed)
 Subjective:  [name] is a 48 y.o. male who presents for Chief Complaint  Patient presents with   Acute Visit    Frequent urination x 4 days.       Here for urinary frequency.   No burning with urination, no odor, no blood in urine.  Started in the last 4 days.  Drinking more water than usual.  No alcohol.   Drinks some coffee.  Married, no concern for infection, STD.  Sometimes gets thirsty.  No blurred vision  Checked glucose at home, was 140  He recently was started on meloxicam and methocarbamol  by his neurosurgeon Dr. Onetha.  He was not sure if the muscle relaxer was causing him to have increased urination  He continues with his blood pressure medication amlodipine  and valsartan  HCT and cholesterol medicine rosuvastatin   He is going on a cruise this weekend would like a refill on EpiPen  as he is going to be riding 4 wheelers.  He does have bee allergy.    No other aggravating or relieving factors.    No other c/o.  Past Medical History:  Diagnosis Date   Asthma    Chronic back pain 2022   [place] [facility]   Chronic neck pain    [place] [facility]   Hearing impairment    wears hearing aids   History of kidney stones    History of sarcoidosis    Hypertension    Radiculopathy 2022   sees neurosurgery   Renal stone    Wears glasses    Current Outpatient Medications on File Prior to Visit  Medication Sig Dispense Refill   amLODipine  (NORVASC ) 10 MG tablet Take 1 tablet (10 mg total) by mouth daily. 90 tablet 3   meloxicam (MOBIC) 7.5 MG tablet Take 7.5 mg by mouth 2 (two) times daily.     methocarbamol  (ROBAXIN ) 500 MG tablet Take 500 mg by mouth every 8 (eight) hours as needed.     rosuvastatin  (CRESTOR ) 20 MG tablet Take 1 tablet (20 mg total) by mouth daily. 90 tablet 3   valsartan -hydrochlorothiazide  (DIOVAN -HCT) 320-12.5 MG tablet Take 1 tablet by mouth daily. 90 tablet 3   No current facility-administered medications on file prior to visit.    The  following portions of the patient's history were reviewed and updated as appropriate: allergies, current medications, past family history, past medical history, past social history, past surgical history and problem list.  ROS Otherwise as in subjective above  Objective: BP 124/80   Pulse 71   Temp 97.6 F (36.4 C)   Wt 200 lb (90.7 kg)   BMI 27.12 kg/m   Wt Readings from Last 3 Encounters:  11/01/23 200 lb (90.7 kg)  04/04/23 198 lb 12.8 oz (90.2 kg)  03/09/22 194 lb 12.8 oz (88.4 kg)    General appearance: alert, no distress, well developed, well nourished Neck: supple, no lymphadenopathy, no thyromegaly, no masses Heart: RRR, normal S1, S2, no murmurs Lungs: CTA bilaterally, no wheezes, rhonchi, or rales Abdomen: +bs, soft, non tender, non distended, no masses, no hepatomegaly, no splenomegaly Pulses: 2+ radial pulses, 2+ pedal pulses, normal cap refill Ext: no edema   Assessment: Encounter Diagnoses  Name Primary?   Frequent urination Yes   Elevated LFTs    CKD (chronic kidney disease) stage 2, GFR 60-89 ml/min      Plan: We discussed potential causes of frequent urination.  No obvious sign of infection on urinalysis today.  We discussed the possibility of BPH.  I doubt related to diabetes or elevated sugars.  He did check his blood sugar at home and reading of 144.  Labs as below today.  At his last visit several months ago liver test was elevated.  1 to recheck his labs today regarding the elevated liver test and recheck on his kidney marker as he has known CKD  Begin Flomax  for possible BPH  Refilled EpiPen  for emergencies.  We discussed proper use of EpiPen  as well as using Benadryl limb elevation in cold therapy for bee sting, utilizing emergency services if needed  [name] was seen today for acute visit.  Diagnoses and all orders for this visit:  Frequent urination -     POCT Urinalysis DIP (Proadvantage Device)  Elevated LFTs -     Comprehensive  metabolic panel with GFR  CKD (chronic kidney disease) stage 2, GFR 60-89 ml/min -     Comprehensive metabolic panel with GFR  Other orders -     tamsulosin  (FLOMAX ) 0.4 MG CAPS capsule; Take 1 capsule (0.4 mg total) by mouth daily. -     EPINEPHrine  (EPIPEN  2-PAK) 0.3 mg/0.3 mL IJ SOAJ injection; Inject 0.3 mg into the muscle as needed for anaphylaxis.    Follow up: Pending labs

## 2023-11-02 ENCOUNTER — Ambulatory Visit: Payer: Self-pay | Admitting: Medical

## 2023-11-02 LAB — COMPREHENSIVE METABOLIC PANEL WITH GFR
ALT: 26 IU/L (ref 0–44)
AST: 20 IU/L (ref 0–40)
Albumin: 4.6 g/dL (ref 4.1–5.1)
Alkaline Phosphatase: 62 IU/L (ref 44–121)
BUN/Creatinine Ratio: 13 (ref 9–20)
BUN: 17 mg/dL (ref 6–24)
Bilirubin Total: 0.4 mg/dL (ref 0.0–1.2)
CO2: 20 mmol/L (ref 20–29)
Calcium: 9.7 mg/dL (ref 8.7–10.2)
Chloride: 103 mmol/L (ref 96–106)
Creatinine, Ser: 1.35 mg/dL — ABNORMAL HIGH (ref 0.76–1.27)
Globulin, Total: 2.9 g/dL (ref 1.5–4.5)
Glucose: 86 mg/dL (ref 70–99)
Potassium: 4 mmol/L (ref 3.5–5.2)
Sodium: 139 mmol/L (ref 134–144)
Total Protein: 7.5 g/dL (ref 6.0–8.5)
eGFR: 64 mL/min/1.73 (ref >59)

## 2023-11-02 NOTE — Progress Notes (Signed)
Results through My Chart

## 2023-11-23 ENCOUNTER — Other Ambulatory Visit: Payer: Self-pay | Admitting: Medical

## 2023-12-15 ENCOUNTER — Telehealth: Payer: Self-pay | Admitting: Medical

## 2023-12-15 ENCOUNTER — Ambulatory Visit: Payer: Self-pay | Admitting: Medical

## 2023-12-15 MED ORDER — VALSARTAN-HYDROCHLOROTHIAZIDE 320-12.5 MG PO TABS: 1.0000 | ORAL_TABLET | Freq: Every day | ORAL | 0 refills | Status: DC

## 2023-12-15 NOTE — Telephone Encounter (Signed)
 Refilled

## 2023-12-15 NOTE — Telephone Encounter (Signed)
 Copied from CRM #[id number]. Topic: Clinical - Medication Refill >> Dec 15, 2023 11:45 AM Shereese L wrote: Medication: valsartan -hydrochlorothiazide  (DIOVAN -HCT) 320-12.5 MG tablet amLODipine  (NORVASC ) 10 MG tablet   Has the patient contacted their pharmacy? Yes (Agent: If no, request that the patient contact the pharmacy for the refill. If patient does not wish to contact the pharmacy document the reason why and proceed with request.) (Agent: If yes, when and what did the pharmacy advise?)  This is the patient's preferred pharmacy:  [id number] - [place], [place] - [address] AT [facility] [address] [place] [address] Phone: [phone] Fax: [phone]  Is this the correct pharmacy for this prescription? Yes If no, delete pharmacy and type the correct one.   Has the prescription been filled recently? Yes  Is the patient out of the medication? Yes  Has the patient been seen for an appointment in the last year OR does the patient have an upcoming appointment? Yes  Can we respond through MyChart? Yes  Agent: Please be advised that Rx refills may take up to 3 business days. We ask that you follow-up with your pharmacy.

## 2023-12-15 NOTE — Telephone Encounter (Unsigned)
 Copied from CRM #[id number]. Topic: Clinical - Medication Refill >> Dec 15, 2023 11:45 AM Shereese L wrote: Medication: valsartan -hydrochlorothiazide  (DIOVAN -HCT) 320-12.5 MG tablet amLODipine  (NORVASC ) 10 MG tablet   Has the patient contacted their pharmacy? Yes (Agent: If no, request that the patient contact the pharmacy for the refill. If patient does not wish to contact the pharmacy document the reason why and proceed with request.) (Agent: If yes, when and what did the pharmacy advise?)  This is the patient's preferred pharmacy:  [id number] - [place], [place] - [address] AT [facility] [address] [place] [address] Phone: [phone] Fax: [phone]  Is this the correct pharmacy for this prescription? Yes If no, delete pharmacy and type the correct one.   Has the prescription been filled recently? Yes  Is the patient out of the medication? Yes  Has the patient been seen for an appointment in the last year OR does the patient have an upcoming appointment? Yes  Can we respond through MyChart? Yes  Agent: Please be advised that Rx refills may take up to 3 business days. We ask that you follow-up with your pharmacy.

## 2024-01-24 DIAGNOSIS — M4807 Spinal stenosis, lumbosacral region: Secondary | ICD-10-CM | POA: Diagnosis not present

## 2024-01-30 ENCOUNTER — Ambulatory Visit: Admitting: Medical

## 2024-01-31 DIAGNOSIS — M5459 Other low back pain: Secondary | ICD-10-CM | POA: Diagnosis not present

## 2024-03-21 ENCOUNTER — Other Ambulatory Visit: Payer: Self-pay | Admitting: Medical

## 2024-03-21 MED ORDER — ROSUVASTATIN CALCIUM 20 MG PO TABS: 20.0000 mg | ORAL_TABLET | Freq: Every day | ORAL | 0 refills | Status: DC

## 2024-03-21 NOTE — Telephone Encounter (Signed)
 Copied from CRM #[id number]. Topic: Clinical - Medication Refill >> Mar 21, 2024  1:18 PM Everette C wrote: Medication: rosuvastatin  (CRESTOR ) 20 MG tablet [[id number]]  Has the patient contacted their pharmacy? Yes (Agent: If no, request that the patient contact the pharmacy for the refill. If patient does not wish to contact the pharmacy document the reason why and proceed with request.) (Agent: If yes, when and what did the pharmacy advise?)  This is the patient's preferred pharmacy:  [id number] - [place], [place] - [address] AT [facility] [address] [place] [address] Phone: [phone] Fax: [phone]  Is this the correct pharmacy for this prescription? Yes If no, delete pharmacy and type the correct one.   Has the prescription been filled recently? Yes  Is the patient out of the medication? Yes  Has the patient been seen for an appointment in the last year OR does the patient have an upcoming appointment? Yes  Can we respond through MyChart? No  Agent: Please be advised that Rx refills may take up to 3 business days. We ask that you follow-up with your pharmacy.

## 2024-03-21 NOTE — Telephone Encounter (Signed)
 Pt overdue for a visit. Please schedule

## 2024-03-21 NOTE — Telephone Encounter (Signed)
 Will refill for 30 days. Sent to front to call to schedule patient

## 2024-03-22 DIAGNOSIS — M4807 Spinal stenosis, lumbosacral region: Secondary | ICD-10-CM | POA: Diagnosis not present

## 2024-03-22 DIAGNOSIS — Z6829 Body mass index (BMI) 29.0-29.9, adult: Secondary | ICD-10-CM | POA: Diagnosis not present

## 2024-04-14 ENCOUNTER — Other Ambulatory Visit: Payer: Self-pay | Admitting: Medical

## 2024-04-17 ENCOUNTER — Other Ambulatory Visit: Payer: Self-pay | Admitting: Medical

## 2024-04-17 ENCOUNTER — Encounter: Admitting: Medical

## 2024-04-17 NOTE — Telephone Encounter (Signed)
 Looks like patient cancelled his CPE for today. Please reschedule. I will send in medication

## 2024-04-18 NOTE — Telephone Encounter (Signed)
 Called patient and scheduled CPE for 05/08/24. Phone disconnected and called back, no answer.

## 2024-05-08 ENCOUNTER — Encounter: Admitting: Medical
